# Patient Record
Sex: Female | Born: 1935 | Race: Black or African American | Hispanic: No | Smoking: Former smoker
Health system: Southern US, Community
[De-identification: ages and names within clinical notes are randomized; demographics above are authoritative.]

## PROBLEM LIST (undated history)

## (undated) ENCOUNTER — Emergency Department

## (undated) DIAGNOSIS — E119 Type 2 diabetes mellitus without complications: Secondary | ICD-10-CM

## (undated) DIAGNOSIS — I639 Cerebral infarction, unspecified: Secondary | ICD-10-CM

## (undated) DIAGNOSIS — I10 Essential (primary) hypertension: Secondary | ICD-10-CM

## (undated) DIAGNOSIS — C801 Malignant (primary) neoplasm, unspecified: Secondary | ICD-10-CM

## (undated) HISTORY — PX: BREAST SURGERY: SHX581

## (undated) HISTORY — PX: KNEE SURGERY: SHX244

---

## 2004-07-02 ENCOUNTER — Ambulatory Visit: Payer: Self-pay | Admitting: Ophthalmology

## 2004-07-07 ENCOUNTER — Ambulatory Visit: Payer: Self-pay | Admitting: Ophthalmology

## 2004-09-11 ENCOUNTER — Emergency Department: Payer: Self-pay | Admitting: Internal Medicine

## 2004-09-11 ENCOUNTER — Other Ambulatory Visit: Payer: Self-pay

## 2004-09-13 ENCOUNTER — Ambulatory Visit: Payer: Self-pay | Admitting: Unknown Physician Specialty

## 2005-12-21 ENCOUNTER — Emergency Department: Payer: Self-pay | Admitting: Unknown Physician Specialty

## 2005-12-27 ENCOUNTER — Ambulatory Visit: Payer: Self-pay | Admitting: Unknown Physician Specialty

## 2006-03-26 ENCOUNTER — Emergency Department: Payer: Self-pay | Admitting: Emergency Medicine

## 2006-06-22 ENCOUNTER — Emergency Department: Payer: Self-pay | Admitting: Emergency Medicine

## 2006-08-16 ENCOUNTER — Emergency Department: Payer: Self-pay | Admitting: Emergency Medicine

## 2006-08-16 ENCOUNTER — Other Ambulatory Visit: Payer: Self-pay

## 2006-09-25 ENCOUNTER — Ambulatory Visit: Payer: Self-pay | Admitting: Unknown Physician Specialty

## 2007-01-01 ENCOUNTER — Emergency Department: Payer: Self-pay | Admitting: Emergency Medicine

## 2007-01-06 ENCOUNTER — Ambulatory Visit: Payer: Self-pay | Admitting: Unknown Physician Specialty

## 2007-01-17 ENCOUNTER — Ambulatory Visit: Payer: Self-pay | Admitting: Unknown Physician Specialty

## 2007-05-20 ENCOUNTER — Other Ambulatory Visit: Payer: Self-pay

## 2007-05-20 ENCOUNTER — Emergency Department: Payer: Self-pay | Admitting: Unknown Physician Specialty

## 2007-08-03 ENCOUNTER — Emergency Department: Payer: Self-pay | Admitting: Emergency Medicine

## 2007-08-03 ENCOUNTER — Other Ambulatory Visit: Payer: Self-pay

## 2007-11-01 ENCOUNTER — Emergency Department: Payer: Self-pay | Admitting: Emergency Medicine

## 2007-12-18 ENCOUNTER — Ambulatory Visit: Payer: Self-pay | Admitting: Unknown Physician Specialty

## 2008-07-15 ENCOUNTER — Emergency Department: Payer: Self-pay | Admitting: Emergency Medicine

## 2009-02-18 ENCOUNTER — Ambulatory Visit: Payer: Self-pay | Admitting: Unknown Physician Specialty

## 2009-03-20 ENCOUNTER — Emergency Department: Payer: Self-pay | Admitting: Emergency Medicine

## 2009-04-07 ENCOUNTER — Emergency Department: Payer: Self-pay | Admitting: Emergency Medicine

## 2009-06-11 ENCOUNTER — Ambulatory Visit: Payer: Self-pay | Admitting: Unknown Physician Specialty

## 2009-07-03 ENCOUNTER — Ambulatory Visit: Payer: Self-pay

## 2009-08-11 ENCOUNTER — Ambulatory Visit: Payer: Self-pay | Admitting: General Practice

## 2009-08-12 ENCOUNTER — Ambulatory Visit: Payer: Self-pay | Admitting: General Practice

## 2009-08-19 ENCOUNTER — Ambulatory Visit: Payer: Self-pay | Admitting: General Practice

## 2009-08-21 ENCOUNTER — Emergency Department: Payer: Self-pay | Admitting: Emergency Medicine

## 2009-09-19 ENCOUNTER — Emergency Department: Payer: Self-pay | Admitting: Internal Medicine

## 2009-09-21 ENCOUNTER — Emergency Department: Payer: Self-pay | Admitting: Emergency Medicine

## 2009-10-06 ENCOUNTER — Emergency Department: Payer: Self-pay | Admitting: Emergency Medicine

## 2009-10-07 ENCOUNTER — Emergency Department: Payer: Self-pay | Admitting: Emergency Medicine

## 2009-10-09 ENCOUNTER — Emergency Department: Payer: Self-pay | Admitting: Internal Medicine

## 2009-10-18 ENCOUNTER — Emergency Department: Payer: Self-pay | Admitting: Unknown Physician Specialty

## 2009-11-13 ENCOUNTER — Emergency Department: Payer: Self-pay | Admitting: Emergency Medicine

## 2009-11-17 ENCOUNTER — Emergency Department: Payer: Self-pay | Admitting: Emergency Medicine

## 2009-11-25 ENCOUNTER — Emergency Department: Payer: Self-pay | Admitting: Emergency Medicine

## 2009-12-09 ENCOUNTER — Ambulatory Visit: Payer: Self-pay | Admitting: Unknown Physician Specialty

## 2010-05-05 ENCOUNTER — Emergency Department: Payer: Self-pay | Admitting: Emergency Medicine

## 2010-05-20 ENCOUNTER — Ambulatory Visit: Payer: Self-pay | Admitting: Unknown Physician Specialty

## 2010-09-20 ENCOUNTER — Emergency Department: Payer: Self-pay | Admitting: Emergency Medicine

## 2010-11-10 ENCOUNTER — Emergency Department: Payer: Self-pay | Admitting: Unknown Physician Specialty

## 2010-12-01 ENCOUNTER — Emergency Department: Payer: Self-pay

## 2011-09-01 ENCOUNTER — Emergency Department: Payer: Self-pay | Admitting: Emergency Medicine

## 2011-09-01 LAB — URINALYSIS, COMPLETE
Bacteria: NONE SEEN
Bilirubin,UR: NEGATIVE
Blood: NEGATIVE
Ketone: NEGATIVE
Ph: 5 (ref 4.5–8.0)
Protein: NEGATIVE
Specific Gravity: 1.009 (ref 1.003–1.030)
Squamous Epithelial: <1
WBC UR: 2 /HPF (ref 0–5)

## 2011-09-01 LAB — CBC
MCV: 92 fL (ref 80–100)
RBC: 4.13 10*6/uL (ref 3.80–5.20)
RDW: 14.3 % (ref 11.5–14.5)

## 2011-09-01 LAB — COMPREHENSIVE METABOLIC PANEL
Albumin: 4 g/dL (ref 3.4–5.0)
Anion Gap: 10 (ref 7–16)
Bilirubin,Total: 0.6 mg/dL (ref 0.2–1.0)
Calcium, Total: 9.2 mg/dL (ref 8.5–10.1)
Chloride: 98 mmol/L (ref 98–107)
Co2: 26 mmol/L (ref 21–32)
EGFR (Non-African Amer.): 51 — ABNORMAL LOW
Osmolality: 273 (ref 275–301)
SGPT (ALT): 20 U/L
Sodium: 134 mmol/L — ABNORMAL LOW (ref 136–145)
Total Protein: 7.9 g/dL (ref 6.4–8.2)

## 2011-09-01 LAB — CK TOTAL AND CKMB (NOT AT ARMC)
CK, Total: 139 U/L (ref 21–215)
CK-MB: 0.8 ng/mL (ref 0.5–3.6)

## 2011-09-01 LAB — TROPONIN I: Troponin-I: 0.02 ng/mL

## 2011-11-07 ENCOUNTER — Ambulatory Visit: Payer: Self-pay | Admitting: Unknown Physician Specialty

## 2011-12-06 ENCOUNTER — Ambulatory Visit: Payer: Self-pay | Admitting: Surgery

## 2011-12-13 ENCOUNTER — Ambulatory Visit: Payer: Self-pay | Admitting: Surgery

## 2011-12-27 ENCOUNTER — Ambulatory Visit: Payer: Self-pay | Admitting: Hematology and Oncology

## 2011-12-27 LAB — COMPREHENSIVE METABOLIC PANEL
Albumin: 3.9 g/dL (ref 3.4–5.0)
Anion Gap: 5 — ABNORMAL LOW (ref 7–16)
BUN: 10 mg/dL (ref 7–18)
Bilirubin,Total: 0.3 mg/dL (ref 0.2–1.0)
Chloride: 103 mmol/L (ref 98–107)
Creatinine: 0.85 mg/dL (ref 0.60–1.30)
EGFR (African American): >60
Glucose: 79 mg/dL (ref 65–99)
Osmolality: 272 (ref 275–301)
Potassium: 3.9 mmol/L (ref 3.5–5.1)
Sodium: 137 mmol/L (ref 136–145)
Total Protein: 7.9 g/dL (ref 6.4–8.2)

## 2011-12-27 LAB — CBC CANCER CENTER
Basophil #: 0.1 x10 3/mm (ref 0.0–0.1)
Eosinophil #: 0 x10 3/mm (ref 0.0–0.7)
Eosinophil %: 0.6 %
HCT: 37.3 % (ref 35.0–47.0)
Lymphocyte %: 29.4 %
Monocyte %: 5.4 %
Platelet: 302 x10 3/mm (ref 150–440)
RBC: 4.12 10*6/uL (ref 3.80–5.20)
RDW: 14.3 % (ref 11.5–14.5)

## 2012-01-10 ENCOUNTER — Ambulatory Visit: Payer: Self-pay | Admitting: Hematology and Oncology

## 2012-02-08 LAB — CBC CANCER CENTER
Basophil #: 0.1 x10 3/mm (ref 0.0–0.1)
Eosinophil %: 1.1 %
HCT: 37.6 % (ref 35.0–47.0)
Lymphocyte %: 24.7 %
MCHC: 32.5 g/dL (ref 32.0–36.0)
MCV: 91 fL (ref 80–100)
Monocyte #: 0.4 x10 3/mm (ref 0.2–0.9)
Monocyte %: 6.7 %
Neutrophil #: 3.9 x10 3/mm (ref 1.4–6.5)
Platelet: 267 x10 3/mm (ref 150–440)
RDW: 14.6 % — ABNORMAL HIGH (ref 11.5–14.5)
WBC: 5.9 x10 3/mm (ref 3.6–11.0)

## 2012-02-10 ENCOUNTER — Ambulatory Visit: Payer: Self-pay | Admitting: Hematology and Oncology

## 2012-02-15 LAB — CBC CANCER CENTER
Basophil #: 0.1 x10 3/mm (ref 0.0–0.1)
Eosinophil #: 0.1 x10 3/mm (ref 0.0–0.7)
Lymphocyte #: 1.6 x10 3/mm (ref 1.0–3.6)
Lymphocyte %: 26.9 %
MCHC: 32.4 g/dL (ref 32.0–36.0)
MCV: 92 fL (ref 80–100)
Monocyte #: 0.6 x10 3/mm (ref 0.2–0.9)
Monocyte %: 10.3 %
Platelet: 262 x10 3/mm (ref 150–440)
RDW: 14.5 % (ref 11.5–14.5)
WBC: 5.8 x10 3/mm (ref 3.6–11.0)

## 2012-02-22 LAB — CBC CANCER CENTER
Basophil %: 1.4 %
Eosinophil #: 0.1 x10 3/mm (ref 0.0–0.7)
HCT: 38 % (ref 35.0–47.0)
HGB: 12.3 g/dL (ref 12.0–16.0)
Lymphocyte %: 21.8 %
MCH: 30.1 pg (ref 26.0–34.0)
MCHC: 32.4 g/dL (ref 32.0–36.0)
Monocyte #: 0.4 x10 3/mm (ref 0.2–0.9)
Neutrophil #: 3.5 x10 3/mm (ref 1.4–6.5)
WBC: 5.1 x10 3/mm (ref 3.6–11.0)

## 2012-02-29 LAB — CBC CANCER CENTER
Basophil #: 0 x10 3/mm (ref 0.0–0.1)
Basophil %: 0.8 %
Eosinophil %: 1.1 %
HCT: 36.1 % (ref 35.0–47.0)
HGB: 11.8 g/dL — ABNORMAL LOW (ref 12.0–16.0)
Lymphocyte %: 18 %
MCV: 92 fL (ref 80–100)
Monocyte #: 0.5 x10 3/mm (ref 0.2–0.9)
Monocyte %: 8.5 %
Platelet: 205 x10 3/mm (ref 150–440)
RBC: 3.93 10*6/uL (ref 3.80–5.20)
RDW: 14.9 % — ABNORMAL HIGH (ref 11.5–14.5)
WBC: 5.5 x10 3/mm (ref 3.6–11.0)

## 2012-03-11 ENCOUNTER — Ambulatory Visit: Payer: Self-pay | Admitting: Hematology and Oncology

## 2012-04-11 ENCOUNTER — Ambulatory Visit: Payer: Self-pay | Admitting: Hematology and Oncology

## 2012-05-12 ENCOUNTER — Ambulatory Visit: Payer: Self-pay | Admitting: Hematology and Oncology

## 2012-06-09 ENCOUNTER — Ambulatory Visit: Payer: Self-pay | Admitting: Hematology and Oncology

## 2012-08-14 ENCOUNTER — Ambulatory Visit: Payer: Self-pay | Admitting: Radiation Oncology

## 2012-08-31 LAB — COMPREHENSIVE METABOLIC PANEL
Albumin: 3.9 g/dL (ref 3.4–5.0)
Alkaline Phosphatase: 65 U/L (ref 50–136)
Anion Gap: 10 (ref 7–16)
Bilirubin,Total: 0.3 mg/dL (ref 0.2–1.0)
Calcium, Total: 9.4 mg/dL (ref 8.5–10.1)
Creatinine: 0.98 mg/dL (ref 0.60–1.30)
EGFR (Non-African Amer.): 56 — ABNORMAL LOW
Glucose: 84 mg/dL (ref 65–99)
Osmolality: 278 (ref 275–301)
Potassium: 3.8 mmol/L (ref 3.5–5.1)
SGOT(AST): 20 U/L (ref 15–37)
Total Protein: 7.5 g/dL (ref 6.4–8.2)

## 2012-08-31 LAB — CBC CANCER CENTER
Basophil %: 1 %
Eosinophil %: 0.5 %
MCH: 31.2 pg (ref 26.0–34.0)
Monocyte #: 0.5 x10 3/mm (ref 0.2–0.9)
RBC: 4.16 10*6/uL (ref 3.80–5.20)
RDW: 14 % (ref 11.5–14.5)
WBC: 7.8 x10 3/mm (ref 3.6–11.0)

## 2012-09-09 ENCOUNTER — Ambulatory Visit: Payer: Self-pay | Admitting: Radiation Oncology

## 2012-11-07 ENCOUNTER — Ambulatory Visit: Payer: Self-pay | Admitting: Surgery

## 2012-11-29 ENCOUNTER — Ambulatory Visit: Payer: Self-pay | Admitting: Hematology and Oncology

## 2012-11-30 LAB — CBC CANCER CENTER
Basophil #: 0.1 x10 3/mm (ref 0.0–0.1)
Basophil %: 1.3 %
Eosinophil %: 1 %
HCT: 38.1 % (ref 35.0–47.0)
HGB: 13.2 g/dL (ref 12.0–16.0)
Lymphocyte #: 1.6 x10 3/mm (ref 1.0–3.6)
Lymphocyte %: 25 %
MCHC: 34.7 g/dL (ref 32.0–36.0)
Monocyte #: 0.4 x10 3/mm (ref 0.2–0.9)
Monocyte %: 5.8 %
Neutrophil #: 4.2 x10 3/mm (ref 1.4–6.5)
Neutrophil %: 66.9 %
WBC: 6.3 x10 3/mm (ref 3.6–11.0)

## 2012-11-30 LAB — COMPREHENSIVE METABOLIC PANEL
Alkaline Phosphatase: 72 U/L (ref 50–136)
Anion Gap: 12 (ref 7–16)
BUN: 10 mg/dL (ref 7–18)
Bilirubin,Total: 0.3 mg/dL (ref 0.2–1.0)
EGFR (African American): 58 — ABNORMAL LOW
EGFR (Non-African Amer.): 50 — ABNORMAL LOW
Glucose: 109 mg/dL — ABNORMAL HIGH (ref 65–99)
Osmolality: 283 (ref 275–301)
SGOT(AST): 22 U/L (ref 15–37)

## 2012-12-10 ENCOUNTER — Ambulatory Visit: Payer: Self-pay | Admitting: Hematology and Oncology

## 2013-03-01 ENCOUNTER — Ambulatory Visit: Payer: Self-pay | Admitting: Hematology and Oncology

## 2013-03-01 LAB — CBC CANCER CENTER
Basophil #: 0.1 "x10 3/mm "
Basophil %: 1.1 %
Eosinophil #: 0.1 "x10 3/mm "
Eosinophil %: 0.8 %
HCT: 41.4 %
HGB: 13.2 g/dL
Lymphocyte %: 20.1 %
Lymphs Abs: 1.5 "x10 3/mm "
MCH: 29.8 pg
MCHC: 32 g/dL
MCV: 93 fL
Monocyte #: 0.4 "x10 3/mm "
Monocyte %: 5 %
Neutrophil #: 5.4 "x10 3/mm "
Neutrophil %: 73 %
Platelet: 234 "x10 3/mm "
RBC: 4.45 "x10 6/mm "
RDW: 13.7 %
WBC: 7.4 "x10 3/mm "

## 2013-03-01 LAB — COMPREHENSIVE METABOLIC PANEL WITH GFR
Albumin: 4.1 g/dL
Alkaline Phosphatase: 68 U/L
Anion Gap: 8
BUN: 15 mg/dL
Bilirubin,Total: 0.3 mg/dL
Calcium, Total: 9.6 mg/dL
Chloride: 101 mmol/L
Co2: 29 mmol/L
Creatinine: 1.39 mg/dL — ABNORMAL HIGH
EGFR (African American): 43 — ABNORMAL LOW
EGFR (Non-African Amer.): 37 — ABNORMAL LOW
Glucose: 228 mg/dL — ABNORMAL HIGH
Osmolality: 284
Potassium: 4.4 mmol/L
SGOT(AST): 19 U/L
SGPT (ALT): 24 U/L
Sodium: 138 mmol/L
Total Protein: 7.6 g/dL

## 2013-03-11 ENCOUNTER — Ambulatory Visit: Payer: Self-pay | Admitting: Hematology and Oncology

## 2013-06-13 DIAGNOSIS — Z853 Personal history of malignant neoplasm of breast: Secondary | ICD-10-CM

## 2013-06-13 DIAGNOSIS — Z8719 Personal history of other diseases of the digestive system: Secondary | ICD-10-CM

## 2013-10-02 ENCOUNTER — Observation Stay: Payer: Self-pay | Admitting: Internal Medicine

## 2013-10-02 LAB — TROPONIN I
TROPONIN-I: 0.06 ng/mL — AB
Troponin-I: 0.07 ng/mL — ABNORMAL HIGH
Troponin-I: 0.06 ng/mL — ABNORMAL HIGH

## 2013-10-02 LAB — CBC
HCT: 38.4 % (ref 35.0–47.0)
HGB: 13 g/dL (ref 12.0–16.0)
MCH: 31 pg (ref 26.0–34.0)
MCHC: 33.9 g/dL (ref 32.0–36.0)
MCV: 91 fL (ref 80–100)
PLATELETS: 251 10*3/uL (ref 150–440)
RBC: 4.2 10*6/uL (ref 3.80–5.20)
RDW: 13.7 % (ref 11.5–14.5)
WBC: 7.4 10*3/uL (ref 3.6–11.0)

## 2013-10-02 LAB — URINALYSIS, COMPLETE
BLOOD: NEGATIVE
Bilirubin,UR: NEGATIVE
GLUCOSE, UR: NEGATIVE mg/dL (ref 0–75)
NITRITE: NEGATIVE
Ph: 5 (ref 4.5–8.0)
Protein: NEGATIVE
Specific Gravity: 1.015 (ref 1.003–1.030)

## 2013-10-02 LAB — COMPREHENSIVE METABOLIC PANEL
ALK PHOS: 49 U/L
ANION GAP: 9 (ref 7–16)
Albumin: 3.8 g/dL (ref 3.4–5.0)
BUN: 9 mg/dL (ref 7–18)
Bilirubin,Total: 0.5 mg/dL (ref 0.2–1.0)
Calcium, Total: 9.3 mg/dL (ref 8.5–10.1)
Chloride: 102 mmol/L (ref 98–107)
Co2: 26 mmol/L (ref 21–32)
Creatinine: 1.22 mg/dL (ref 0.60–1.30)
EGFR (African American): 50 — ABNORMAL LOW
GFR CALC NON AF AMER: 43 — AB
Glucose: 189 mg/dL — ABNORMAL HIGH (ref 65–99)
Osmolality: 278 (ref 275–301)
Potassium: 3.8 mmol/L (ref 3.5–5.1)
SGOT(AST): 22 U/L (ref 15–37)
SGPT (ALT): 16 U/L (ref 12–78)
Sodium: 137 mmol/L (ref 136–145)
Total Protein: 7.3 g/dL (ref 6.4–8.2)

## 2013-10-02 LAB — LIPASE, BLOOD: LIPASE: 83 U/L (ref 73–393)

## 2013-10-02 LAB — CK TOTAL AND CKMB (NOT AT ARMC)
CK, Total: 76 U/L
CK-MB: 0.5 ng/mL (ref 0.5–3.6)

## 2013-10-02 LAB — HEMOGLOBIN A1C: HEMOGLOBIN A1C: 8 % — AB (ref 4.2–6.3)

## 2013-10-03 LAB — CBC WITH DIFFERENTIAL/PLATELET
Basophil #: 0 10*3/uL (ref 0.0–0.1)
Basophil %: 0.6 %
EOS ABS: 0.1 10*3/uL (ref 0.0–0.7)
Eosinophil %: 1.2 %
HCT: 37 % (ref 35.0–47.0)
HGB: 12.6 g/dL (ref 12.0–16.0)
LYMPHS PCT: 28.5 %
Lymphocyte #: 1.7 10*3/uL (ref 1.0–3.6)
MCH: 31 pg (ref 26.0–34.0)
MCHC: 33.9 g/dL (ref 32.0–36.0)
MCV: 91 fL (ref 80–100)
MONO ABS: 0.4 x10 3/mm (ref 0.2–0.9)
Monocyte %: 7.3 %
NEUTROS ABS: 3.7 10*3/uL (ref 1.4–6.5)
NEUTROS PCT: 62.4 %
PLATELETS: 230 10*3/uL (ref 150–440)
RBC: 4.06 10*6/uL (ref 3.80–5.20)
RDW: 13.7 % (ref 11.5–14.5)
WBC: 5.9 10*3/uL (ref 3.6–11.0)

## 2013-10-03 LAB — LIPID PANEL
Cholesterol: 120 mg/dL (ref 0–200)
HDL Cholesterol: 43 mg/dL (ref 40–60)
Ldl Cholesterol, Calc: 45 mg/dL (ref 0–100)
Triglycerides: 159 mg/dL (ref 0–200)
VLDL Cholesterol, Calc: 32 mg/dL (ref 5–40)

## 2013-10-03 LAB — BASIC METABOLIC PANEL
Anion Gap: 8 (ref 7–16)
BUN: 9 mg/dL (ref 7–18)
CALCIUM: 8.7 mg/dL (ref 8.5–10.1)
CHLORIDE: 104 mmol/L (ref 98–107)
CO2: 28 mmol/L (ref 21–32)
CREATININE: 1.02 mg/dL (ref 0.60–1.30)
EGFR (African American): >60
GFR CALC NON AF AMER: 53 — AB
Glucose: 117 mg/dL — ABNORMAL HIGH (ref 65–99)
Osmolality: 279 (ref 275–301)
Potassium: 3.3 mmol/L — ABNORMAL LOW (ref 3.5–5.1)
Sodium: 140 mmol/L (ref 136–145)

## 2013-10-04 LAB — URINE CULTURE

## 2013-11-11 ENCOUNTER — Ambulatory Visit: Payer: Self-pay | Admitting: Hematology and Oncology

## 2014-03-02 LAB — CBC WITH DIFFERENTIAL/PLATELET
Basophil #: 0.1 10*3/uL (ref 0.0–0.1)
Basophil %: 1 %
EOS ABS: 0.1 10*3/uL (ref 0.0–0.7)
Eosinophil %: 1 %
HCT: 41.6 % (ref 35.0–47.0)
HGB: 13.5 g/dL (ref 12.0–16.0)
LYMPHS PCT: 23.2 %
Lymphocyte #: 1.6 10*3/uL (ref 1.0–3.6)
MCH: 30.9 pg (ref 26.0–34.0)
MCHC: 32.3 g/dL (ref 32.0–36.0)
MCV: 96 fL (ref 80–100)
Monocyte #: 0.4 x10 3/mm (ref 0.2–0.9)
Monocyte %: 6.1 %
NEUTROS ABS: 4.8 10*3/uL (ref 1.4–6.5)
Neutrophil %: 68.7 %
PLATELETS: 222 10*3/uL (ref 150–440)
RBC: 4.35 10*6/uL (ref 3.80–5.20)
RDW: 13.5 % (ref 11.5–14.5)
WBC: 7 10*3/uL (ref 3.6–11.0)

## 2014-03-02 LAB — URINALYSIS, COMPLETE
BLOOD: NEGATIVE
Bilirubin,UR: NEGATIVE
Glucose,UR: NEGATIVE mg/dL (ref 0–75)
Nitrite: NEGATIVE
Ph: 5 (ref 4.5–8.0)
Protein: 30
Specific Gravity: 1.021 (ref 1.003–1.030)
WBC UR: 163 /HPF (ref 0–5)

## 2014-03-02 LAB — BASIC METABOLIC PANEL
ANION GAP: 10 (ref 7–16)
BUN: 10 mg/dL (ref 7–18)
CHLORIDE: 103 mmol/L (ref 98–107)
CO2: 27 mmol/L (ref 21–32)
Calcium, Total: 9 mg/dL (ref 8.5–10.1)
Creatinine: 1.12 mg/dL (ref 0.60–1.30)
EGFR (African American): >60
EGFR (Non-African Amer.): 50 — ABNORMAL LOW
Glucose: 158 mg/dL — ABNORMAL HIGH (ref 65–99)
Osmolality: 282 (ref 275–301)
POTASSIUM: 4 mmol/L (ref 3.5–5.1)
Sodium: 140 mmol/L (ref 136–145)

## 2014-03-02 LAB — TROPONIN I: TROPONIN-I: 0.08 ng/mL — AB

## 2014-03-03 ENCOUNTER — Other Ambulatory Visit: Payer: Self-pay | Admitting: Physician Assistant

## 2014-03-03 ENCOUNTER — Inpatient Hospital Stay: Payer: Self-pay | Admitting: Internal Medicine

## 2014-03-03 LAB — CK-MB
CK-MB: 0.7 ng/mL (ref 0.5–3.6)
CK-MB: 0.6 ng/mL (ref 0.5–3.6)
CK-MB: 0.7 ng/mL (ref 0.5–3.6)

## 2014-03-03 LAB — TROPONIN I
Troponin-I: 0.08 ng/mL — ABNORMAL HIGH
Troponin-I: 0.08 ng/mL — ABNORMAL HIGH

## 2014-03-04 LAB — BASIC METABOLIC PANEL
Anion Gap: 10 (ref 7–16)
BUN: 10 mg/dL (ref 7–18)
Calcium, Total: 9.3 mg/dL (ref 8.5–10.1)
Chloride: 103 mmol/L (ref 98–107)
Co2: 29 mmol/L (ref 21–32)
Creatinine: 0.94 mg/dL (ref 0.60–1.30)
EGFR (African American): >60
EGFR (Non-African Amer.): >60
Glucose: 90 mg/dL (ref 65–99)
Osmolality: 282 (ref 275–301)
Potassium: 3.4 mmol/L — ABNORMAL LOW (ref 3.5–5.1)
Sodium: 142 mmol/L (ref 136–145)

## 2014-03-04 LAB — LIPID PANEL
Cholesterol: 124 mg/dL (ref 0–200)
HDL Cholesterol: 43 mg/dL (ref 40–60)
Ldl Cholesterol, Calc: 57 mg/dL (ref 0–100)
Triglycerides: 120 mg/dL (ref 0–200)
VLDL Cholesterol, Calc: 24 mg/dL (ref 5–40)

## 2014-03-04 LAB — HEMOGLOBIN A1C: Hemoglobin A1C: 7.3 % — ABNORMAL HIGH (ref 4.2–6.3)

## 2014-03-04 LAB — TSH: Thyroid Stimulating Horm: 0.641 u[IU]/mL

## 2014-03-05 LAB — URINALYSIS, COMPLETE
BLOOD: NEGATIVE
Bacteria: NONE SEEN
Bilirubin,UR: NEGATIVE
GLUCOSE, UR: NEGATIVE mg/dL (ref 0–75)
Hyaline Cast: 11
KETONE: NEGATIVE
Nitrite: NEGATIVE
PROTEIN: NEGATIVE
Ph: 5 (ref 4.5–8.0)
RBC,UR: 2 /HPF (ref 0–5)
SPECIFIC GRAVITY: 1.026 (ref 1.003–1.030)
Squamous Epithelial: 1
WBC UR: 7 /HPF (ref 0–5)

## 2014-03-05 LAB — COMPREHENSIVE METABOLIC PANEL
ALBUMIN: 3.9 g/dL (ref 3.4–5.0)
AST: 23 U/L (ref 15–37)
Alkaline Phosphatase: 60 U/L
Anion Gap: 11 (ref 7–16)
BUN: 9 mg/dL (ref 7–18)
Bilirubin,Total: 0.6 mg/dL (ref 0.2–1.0)
CREATININE: 0.99 mg/dL (ref 0.60–1.30)
Calcium, Total: 8.8 mg/dL (ref 8.5–10.1)
Chloride: 103 mmol/L (ref 98–107)
Co2: 26 mmol/L (ref 21–32)
EGFR (African American): >60
EGFR (Non-African Amer.): 58 — ABNORMAL LOW
Glucose: 202 mg/dL — ABNORMAL HIGH (ref 65–99)
Osmolality: 284 (ref 275–301)
Potassium: 3.3 mmol/L — ABNORMAL LOW (ref 3.5–5.1)
SGPT (ALT): 19 U/L
SODIUM: 140 mmol/L (ref 136–145)
TOTAL PROTEIN: 7.7 g/dL (ref 6.4–8.2)

## 2014-03-05 LAB — PROTIME-INR
INR: 1.1
Prothrombin Time: 14.1 secs (ref 11.5–14.7)

## 2014-03-05 LAB — CBC WITH DIFFERENTIAL/PLATELET
BASOS ABS: 0 10*3/uL (ref 0.0–0.1)
Basophil %: 0.5 %
EOS ABS: 0.1 10*3/uL (ref 0.0–0.7)
EOS PCT: 1.1 %
HCT: 41.6 % (ref 35.0–47.0)
HGB: 13.7 g/dL (ref 12.0–16.0)
LYMPHS ABS: 1.3 10*3/uL (ref 1.0–3.6)
Lymphocyte %: 24.5 %
MCH: 30.8 pg (ref 26.0–34.0)
MCHC: 32.8 g/dL (ref 32.0–36.0)
MCV: 94 fL (ref 80–100)
MONO ABS: 0.4 x10 3/mm (ref 0.2–0.9)
Monocyte %: 8.2 %
Neutrophil #: 3.4 10*3/uL (ref 1.4–6.5)
Neutrophil %: 65.7 %
Platelet: 255 10*3/uL (ref 150–440)
RBC: 4.44 10*6/uL (ref 3.80–5.20)
RDW: 13.7 % (ref 11.5–14.5)
WBC: 5.1 10*3/uL (ref 3.6–11.0)

## 2014-03-05 LAB — CK TOTAL AND CKMB (NOT AT ARMC)
CK, Total: 127 U/L (ref 26–192)
CK-MB: 0.9 ng/mL (ref 0.5–3.6)

## 2014-03-05 LAB — TROPONIN I: Troponin-I: 0.07 ng/mL — ABNORMAL HIGH

## 2014-03-06 ENCOUNTER — Inpatient Hospital Stay: Payer: Self-pay | Admitting: Internal Medicine

## 2014-03-06 LAB — CBC WITH DIFFERENTIAL/PLATELET
Basophil #: 0 10*3/uL (ref 0.0–0.1)
Basophil %: 0.8 %
Eosinophil #: 0.1 10*3/uL (ref 0.0–0.7)
Eosinophil %: 0.9 %
HCT: 39.7 % (ref 35.0–47.0)
HGB: 12.9 g/dL (ref 12.0–16.0)
Lymphocyte #: 1.5 10*3/uL (ref 1.0–3.6)
Lymphocyte %: 27.3 %
MCH: 30.5 pg (ref 26.0–34.0)
MCHC: 32.3 g/dL (ref 32.0–36.0)
MCV: 94 fL (ref 80–100)
Monocyte #: 0.4 x10 3/mm (ref 0.2–0.9)
Monocyte %: 7.9 %
Neutrophil #: 3.5 10*3/uL (ref 1.4–6.5)
Neutrophil %: 63.1 %
PLATELETS: 236 10*3/uL (ref 150–440)
RBC: 4.21 10*6/uL (ref 3.80–5.20)
RDW: 13.3 % (ref 11.5–14.5)
WBC: 5.6 10*3/uL (ref 3.6–11.0)

## 2014-03-06 LAB — BASIC METABOLIC PANEL
ANION GAP: 6 — AB (ref 7–16)
BUN: 8 mg/dL (ref 7–18)
CALCIUM: 8.6 mg/dL (ref 8.5–10.1)
CHLORIDE: 109 mmol/L — AB (ref 98–107)
Co2: 28 mmol/L (ref 21–32)
Creatinine: 0.88 mg/dL (ref 0.60–1.30)
EGFR (African American): >60
EGFR (Non-African Amer.): >60
Glucose: 142 mg/dL — ABNORMAL HIGH (ref 65–99)
Osmolality: 286 (ref 275–301)
Potassium: 4.1 mmol/L (ref 3.5–5.1)
SODIUM: 143 mmol/L (ref 136–145)

## 2014-03-06 LAB — LIPID PANEL
Cholesterol: 145 mg/dL (ref 0–200)
HDL Cholesterol: 48 mg/dL (ref 40–60)
Ldl Cholesterol, Calc: 72 mg/dL (ref 0–100)
Triglycerides: 127 mg/dL (ref 0–200)
VLDL Cholesterol, Calc: 25 mg/dL (ref 5–40)

## 2014-03-07 LAB — BASIC METABOLIC PANEL
Anion Gap: 6 — ABNORMAL LOW (ref 7–16)
BUN: 10 mg/dL (ref 7–18)
CALCIUM: 9.2 mg/dL (ref 8.5–10.1)
CO2: 30 mmol/L (ref 21–32)
Chloride: 103 mmol/L (ref 98–107)
Creatinine: 1.1 mg/dL (ref 0.60–1.30)
EGFR (African American): >60
GFR CALC NON AF AMER: 51 — AB
Glucose: 136 mg/dL — ABNORMAL HIGH (ref 65–99)
Osmolality: 279 (ref 275–301)
Potassium: 3.8 mmol/L (ref 3.5–5.1)
Sodium: 139 mmol/L (ref 136–145)

## 2014-03-12 ENCOUNTER — Emergency Department: Payer: Self-pay | Admitting: Emergency Medicine

## 2014-03-12 LAB — URINALYSIS, COMPLETE
BILIRUBIN, UR: NEGATIVE
BLOOD: NEGATIVE
Bacteria: NONE SEEN
Glucose,UR: NEGATIVE mg/dL (ref 0–75)
Hyaline Cast: 24
NITRITE: NEGATIVE
PH: 5 (ref 4.5–8.0)
Specific Gravity: 1.018 (ref 1.003–1.030)
WBC UR: 6 /HPF (ref 0–5)

## 2014-03-12 LAB — BASIC METABOLIC PANEL
ANION GAP: 14 (ref 7–16)
BUN: 12 mg/dL (ref 7–18)
CALCIUM: 9.5 mg/dL (ref 8.5–10.1)
CHLORIDE: 101 mmol/L (ref 98–107)
Co2: 22 mmol/L (ref 21–32)
Creatinine: 1.2 mg/dL (ref 0.60–1.30)
EGFR (African American): 56 — ABNORMAL LOW
EGFR (Non-African Amer.): 46 — ABNORMAL LOW
Glucose: 203 mg/dL — ABNORMAL HIGH (ref 65–99)
Osmolality: 279 (ref 275–301)
Potassium: 3.9 mmol/L (ref 3.5–5.1)
Sodium: 137 mmol/L (ref 136–145)

## 2014-03-12 LAB — CBC
HCT: 39.4 % (ref 35.0–47.0)
HGB: 13 g/dL (ref 12.0–16.0)
MCH: 30.9 pg (ref 26.0–34.0)
MCHC: 33 g/dL (ref 32.0–36.0)
MCV: 94 fL (ref 80–100)
Platelet: 257 10*3/uL (ref 150–440)
RBC: 4.22 10*6/uL (ref 3.80–5.20)
RDW: 13.5 % (ref 11.5–14.5)
WBC: 7 10*3/uL (ref 3.6–11.0)

## 2014-03-12 LAB — TROPONIN I: Troponin-I: 0.05 ng/mL

## 2014-06-02 IMAGING — CT CT OUTSIDE FILMS BODY
1 series · 16 of 32 positions shown, 20 images · non-contrast
Comparison: none

[Series 2: soft tissue (id) x (id) · axial · 0.98mm/px · z∈[-135,+185]mm · 16 of 143 slices shown, 20 images]
[im 10/143  soft-tissue]
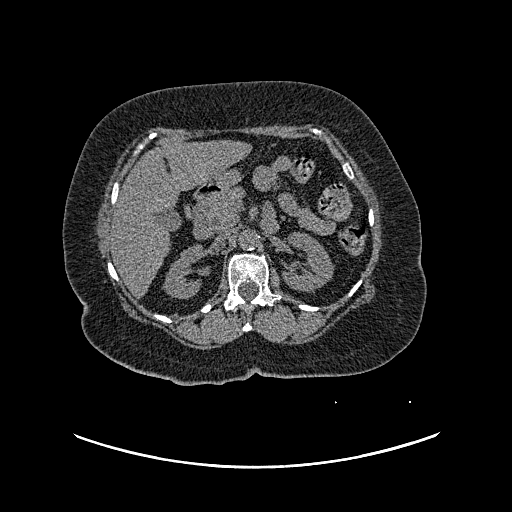
[im 10/143  bone]
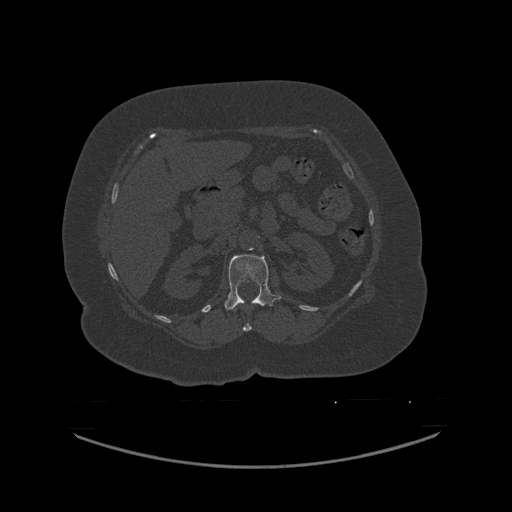
[im 19/143  soft-tissue]
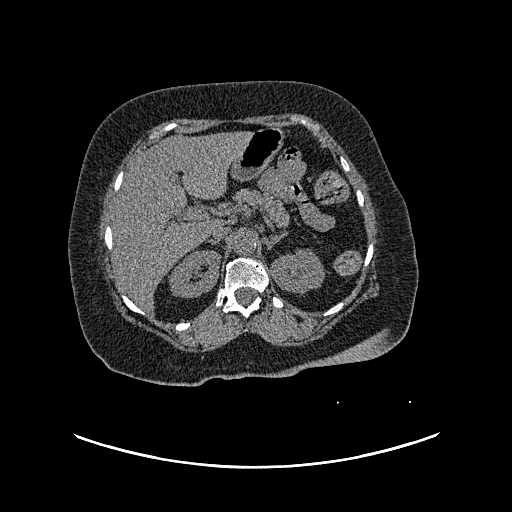
[im 28/143  soft-tissue]
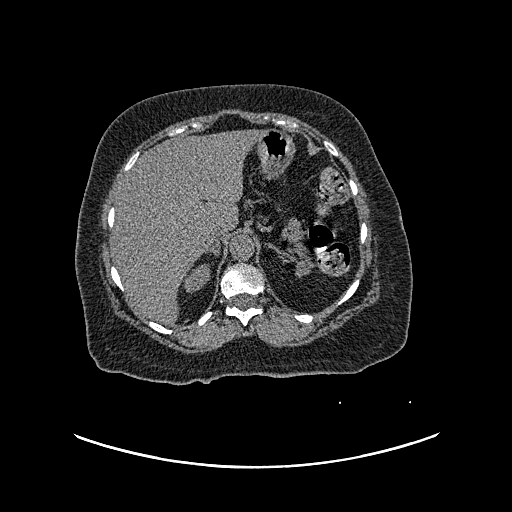
[im 37/143  soft-tissue]
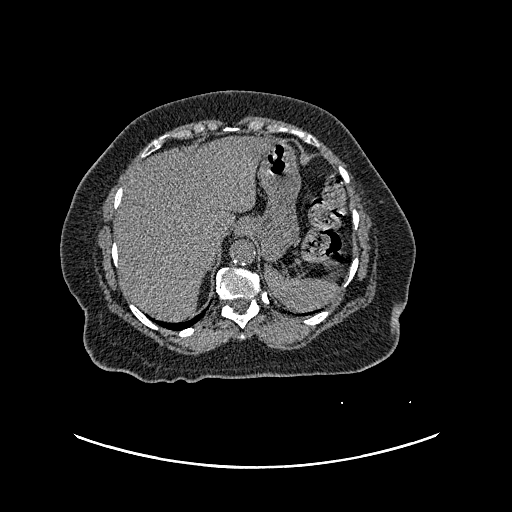
[im 46/143  soft-tissue]
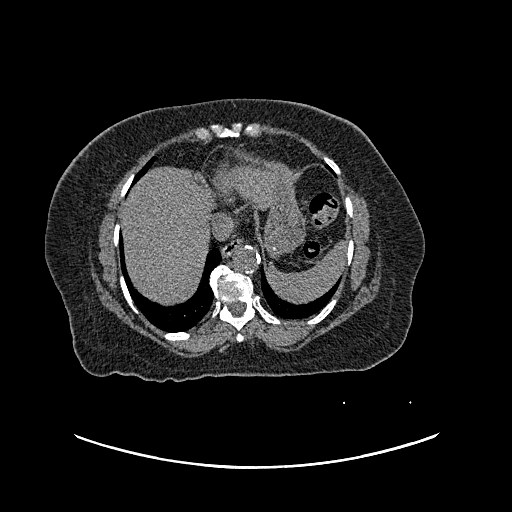
[im 55/143  soft-tissue]
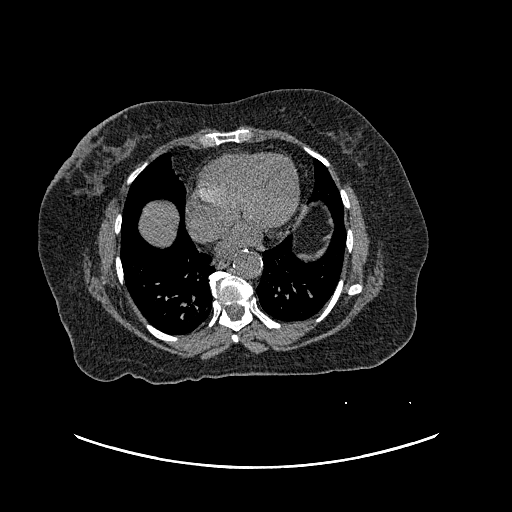
[im 65/143  soft-tissue]
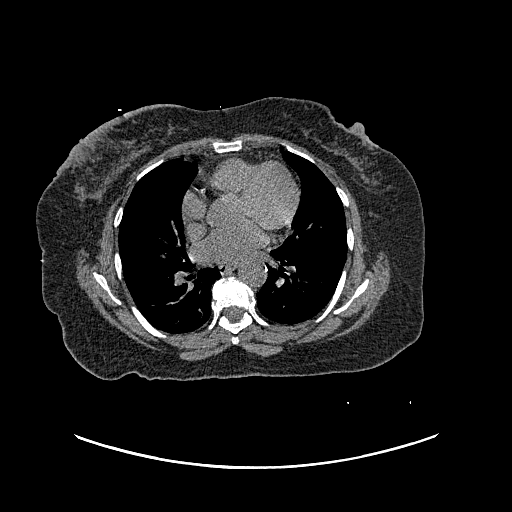
[im 78/143  soft-tissue]
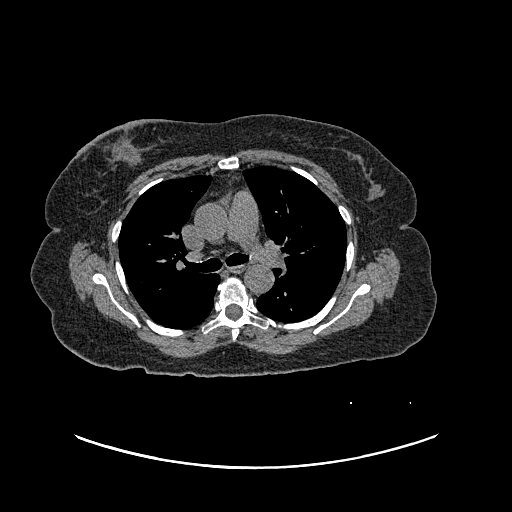
[im 88/143  soft-tissue]
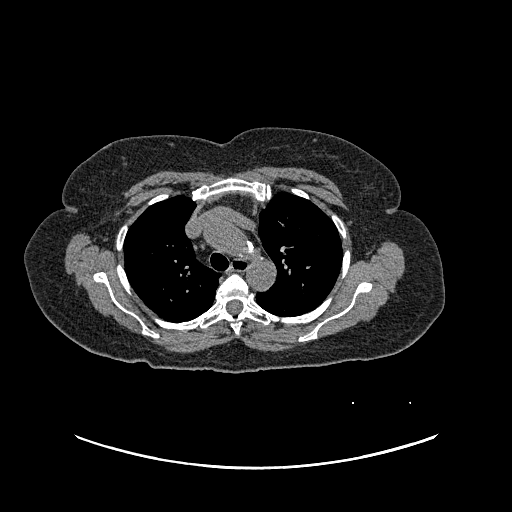
[im 88/143  bone]
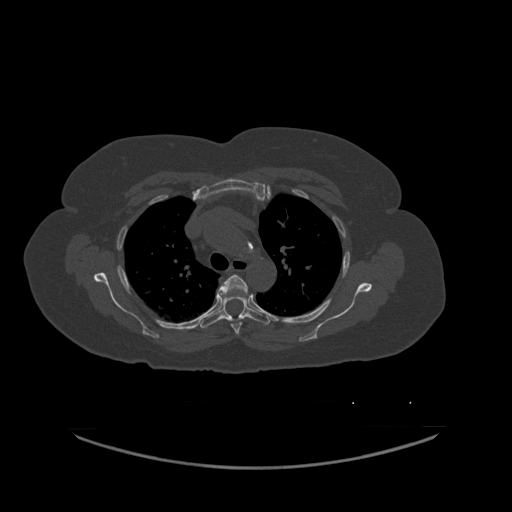
[im 97/143  soft-tissue]
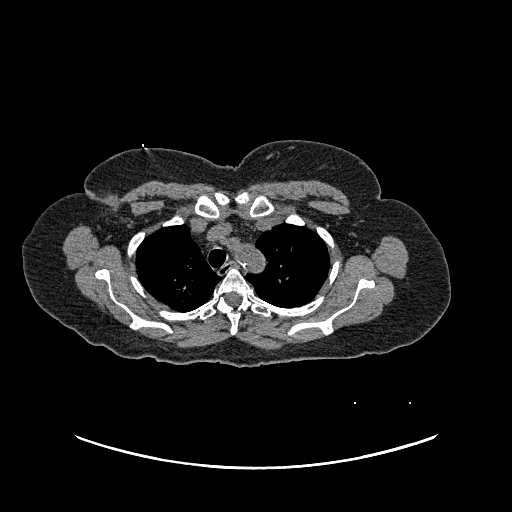
[im 106/143  soft-tissue]
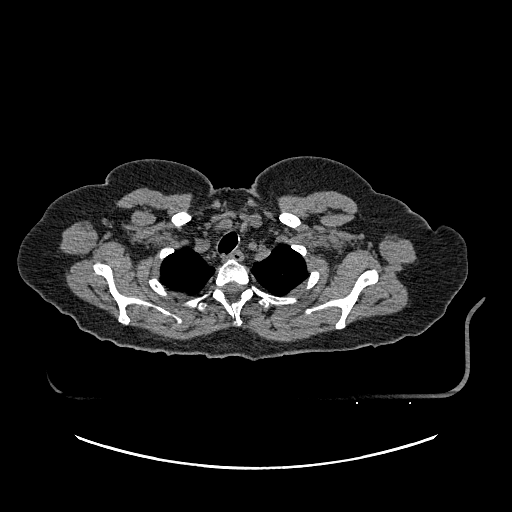
[im 115/143  soft-tissue]
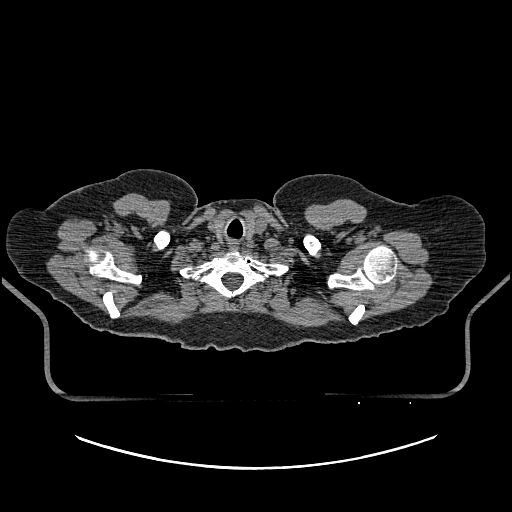
[im 124/143  soft-tissue]
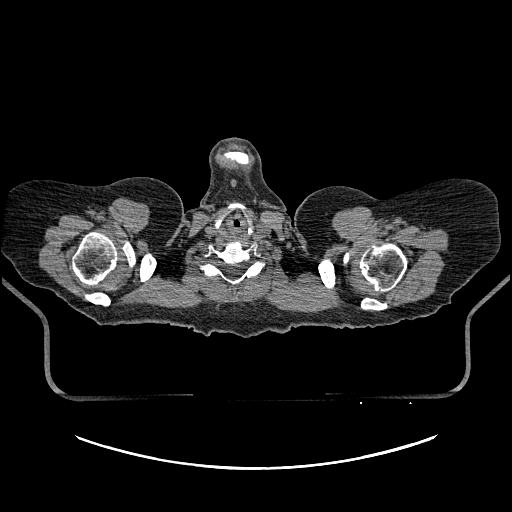
[im 124/143  lung]
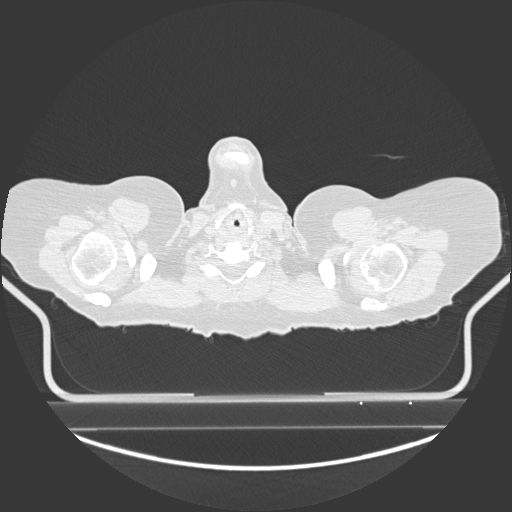
[im 129/143  lung]
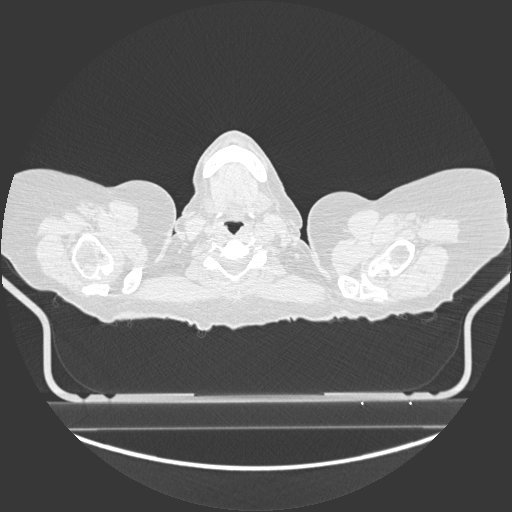
[im 133/143  soft-tissue]
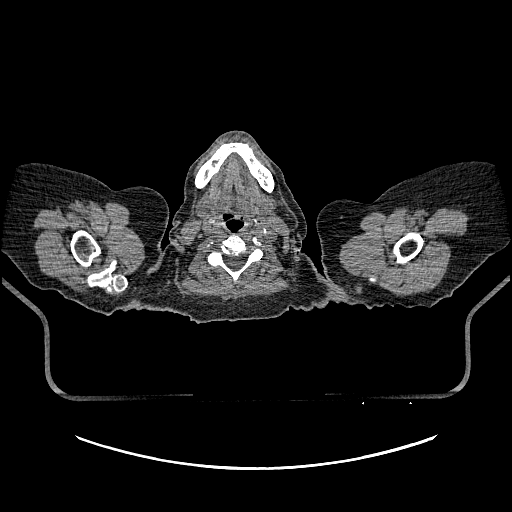
[im 133/143  lung]
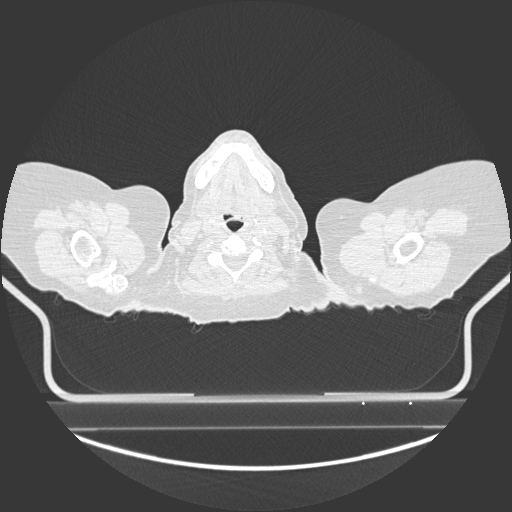
[im 138/143  lung]
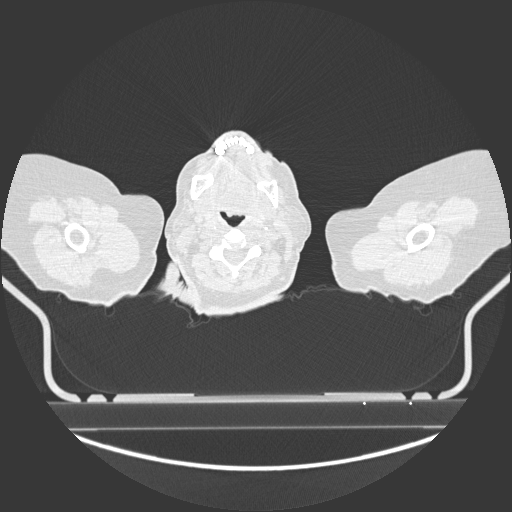

[16 of 32 positions shown; findings below may reference images not displayed]

**** An original report or order could not be provided from the [HOSPITAL] [facility] ****

## 2014-06-09 DIAGNOSIS — I10 Essential (primary) hypertension: Secondary | ICD-10-CM | POA: Diagnosis present

## 2014-07-29 NOTE — Op Note (Signed)
PATIENT NAME:  [name], [name] MR#:  [id number] DATE OF BIRTH:  01-28-1936  DATE OF PROCEDURE:  12/13/2011  PREOPERATIVE DIAGNOSIS: Carcinoma of the right breast.   POSTOPERATIVE DIAGNOSIS: Carcinoma of the right breast.   PROCEDURE: Right partial mastectomy with axillary sentinel lymph node biopsy.   SURGEON: Loreli Dollar, MD  ANESTHESIA: General.   INDICATIONS: This 79 year old female recently had findings of a mass which is palpable in the upper aspect of the right breast. She had needle biopsy findings of cancer and surgery was recommended for definitive treatment.   DESCRIPTION OF PROCEDURE: The patient was placed on the operating table in the supine position under general anesthesia. The right arm was placed on a lateral arm support. The right breast and surrounding chest wall and upper arm were prepared with ChloraPrep, draped in a sterile manner. It is noted the patient had preoperative injection of radioactive technetium sulfur colloid. I reviewed her scan which demonstrated two lymph nodes in the axilla.   The gamma counter was used to probe the axilla and identified the site of radioactivity in the inferior aspect of the right axilla. An oblique incision was made in the inferior aspect of the axilla and carried down through subcutaneous tissues. One traversing artery was divided between 4-0 chromic suture ligatures. Dissection was carried down through superficial fascia down deep within the axilla adjacent to the chest wall where radioactivity was found with the gamma counter and there was a lymph node found which was removed with some surrounding fatty tissue using electrocautery for hemostasis and the ex vivo count was in the range of 250 to 300 counts per second. This was submitted as sentinel lymph node #1. There was additional radioactivity found somewhat deeper adjacent to the rib cage. Another lymph node was dissected free from surrounding structures. The ex vivo count was in the  range of 300 to 350 and this was labeled as sentinel lymph node #2 and submitted for pathology. The background count was in the range of 0 to 6. The wound was inspected. There was no palpable mass within the axilla. Several small bleeding points were cauterized. Hemostasis was subsequently intact. The retractor was removed and allowed the wound to approximate spontaneously.   Next, the mass was palpable at approximately 12:00 position of the right breast several centimeters above the areola. A transversely oriented elliptical excision was carried out removing approximately 1.5 cm ellipse of skin. The incision went from 10:00 to 2:00. The 2:00 position of the skin ellipse was tagged with a chromic stitch for the pathologist's orientation. Dissection was carried down through subcutaneous tissues and while palpating the mass the dissection was carried around the mass removing normal appearing tissue surrounding the mass. It was somewhat deep within the right breast. Margin maps were used to suture markers to the specimen marking the cranial, caudal, medial, lateral, and deep margins and was submitted for pathology.   It is noted that during the course of the procedure a pathologist called back three times. The first two times to indicate that the sentinel lymph nodes were free of tumor and the third time to indicate that the margins around the cancer appeared to be a centimeter and were adequate.   The axillary wound was inspected and hemostasis was intact. Subcutaneous tissues were closed with 4-0 chromic. The skin was closed with 4-0 Monocryl running subcuticular suture.   The partial mastectomy wound was inspected. Numerous small bleeding points were cauterized. The subcutaneous tissues for both incisions were  infiltrated with 0.5% Sensorcaine with epinephrine and also in the partial mastectomy wound some of the deeper tissues around the cauterized bleeding points were infiltrated as well. The subcutaneous  tissues were closed with interrupted inverted 4-0 chromic and then the skin was closed with running 4-0 Monocryl subcuticular suture.   Both wounds were treated with Dermabond. The patient tolerated surgery satisfactorily and was prepared for transfer to the recovery room.  ____________________________ Lenna Sciara. Rochel Brome, MD jws:cms D: 12/13/2011 13:10:10 ET T: 12/13/2011 13:39:35 ET JOB#: [id number]  cc: Loreli Dollar, MD, <Dictator>  Loreli Dollar MD ELECTRONICALLY SIGNED 12/14/2011 12:04

## 2014-08-02 NOTE — Discharge Summary (Signed)
PATIENT NAME:  [name] [name], [name] [name] MR#:  [id number] DATE OF BIRTH:  11-May-1935  DATE OF ADMISSION:  03/06/2014 DATE OF DISCHARGE:  03/08/2014  DIAGNOSIS AT TIME FOR DISCHARGE: 1.  Presyncope with vertigo and likely secondary to left occipital cerebrovascular accident following recent cardiac catheterization.  2.  Hypertension.  3.  Hypokalemia.  4.  Urinary tract infection.  5.  Type 2 diabetes.   CHIEF COMPLAINT: Weakness, decreased appetite.   HISTORY OF PRESENT ILLNESS: [name] [name] is a 79 year old African American female with a history of hypertension and diabetes who had been admitted to the hospital on 03/04/2014 with weakness and near syncope and was noted to have elevated troponins. The patient had been seen by Dr. Clayborn Bigness and subsequently underwent a cardiac catheterization which showed only minimal coronary artery disease. The patient had been advised to stay, but declined further workup and was discharged. She returns now complaining of generalized weakness. She has also been complaining of vertigo and has to hold onto things even to stand up. The patient reports significant weight loss in the last few months.   PAST MEDICAL HISTORY: Significant for hypertension, insulin-requiring diabetes, history of breast cancer, status post lumpectomy and radiation. Please see H and P for other details.   HOSPITAL COURSE: The patient underwent imaging studies including CT of the head, which did not show any acute abnormality. There was stable mild atrophy with chronic microvascular ischemic white matter changes and mild atherosclerotic calcification in the bilateral cavernous carotid arteries; however, an MRI of the brain without contrast showed evidence of small areas of acute infarct in the left occipital and parietal lobe likely related to emboli given history of recent cardiac catheterization. The patient was ambulated and overall appeared to be stable and was discharged in stable condition on the  following medications.  MEDICATIONS ON DISCHARGE: Atorvastatin 10 mg once a day, Levemir insulin 30 units subcutaneous at bedtime, Levaquin 250 mg a day for 2 more days, nitroglycerin 0.4 mg sublingual p.r.n., metformin 1000 mg 1.5 tablets in the morning and 1 tablet in the evening, carvedilol 3.125 mg b.i.d., aspirin 325 mg once a day, meclizine 12.5 mg 4 times a day as needed, alprazolam 0.5 mg p.o. b.i.d. p.r.n. for anxiety, fluticasone nasal spray 2 sprays intranasally as needed, benazepril HCT 20/12.5 one tablet once a day.   FOLLOWUP: The patient has been advised to follow up with Dr. Candiss Norse and call back with any questions or concerns.   TOTAL TIME SPENT IN DISCHARGING THE PATIENT: Thirty-five minutes.    ____________________________ Tracie Harrier, MD vh:TT D: 03/09/2014 13:07:37 ET T: 03/09/2014 21:23:08 ET JOB#: [id number]  cc: Tracie Harrier, MD, <Dictator> Tracie Harrier MD ELECTRONICALLY SIGNED 03/11/2014 17:50

## 2014-08-02 NOTE — H&P (Signed)
PATIENT NAME:  [name] [name], [name] [name] MR#:  [id number] DATE OF BIRTH:  1936-03-21  DATE OF ADMISSION:  03/03/2014  REFERRING PHYSICIAN:  Gretchen Short. Beather Arbour, MD   PRIMARY CARE PHYSICIAN:  Glendon Axe, MD  ADMISSION DIAGNOSIS:  Non-ST elevation myocardial infarction.   HISTORY OF PRESENT ILLNESS:  This is a 79 year old African-American female who presents to the Emergency Department complaining of chest pain. The patient states that it began with minimal exertion as she walked to her refrigerator from the couch, where she had been resting most of the day. She opened the refrigerator door and felt a heat wave come over her face. The pain in her chest felt as if somebody were standing on her chest. She made her way back to the couch with a considerable amount of effort, where the pain gradually decreased, but she still feels as if her chest feels "heavy." She admits to some nausea. Both sensations lasted until the patient arrived in the Emergency Department via EMS. Her troponin was found to be elevated more than on previous evaluations, and she was started on therapeutic Lovenox. Due to these reasons, the Emergency Department called for admission for cardiac evaluation.   REVIEW OF SYSTEMS: CONSTITUTIONAL:  The patient denies fever but admits to generalized fatigue.  EYES:  Denies blurred vision or inflammation.  EARS, NOSE, AND THROAT:  Denies tinnitus or sore throat.  RESPIRATORY:  Denies cough or shortness of breath.  CARDIOVASCULAR:  Admits to chest pain but denies palpitations, orthopnea, or paroxysmal nocturnal dyspnea.  GASTROINTESTINAL:  Admits to nausea but denies vomiting, diarrhea, or abdominal pain.  GENITOURINARY:  Denies dysuria, increased frequency, or hesitancy of urination.  ENDOCRINE:  Denies polyuria or polydipsia.  HEMATOLOGIC AND LYMPHATIC:  Denies easy bruising or bleeding.  INTEGUMENT:  Denies rashes or lesions.  MUSCULOSKELETAL:  Denies myalgias or arthralgias.  NEUROLOGIC:  Denies  numbness in her extremities or dysarthria.  PSYCHIATRIC:  Denies depression or suicidal ideation.   PAST MEDICAL HISTORY:  Diabetes mellitus type 2, hypertension, and history of breast cancer.   PAST SURGICAL HISTORY:  Lumpectomy.   SOCIAL HISTORY:  The patient quit smoking 25 years ago. She denies any alcohol or drug use.   FAMILY HISTORY:  Significant for hypertension, diabetes type 2, and a sister who is deceased of breast cancer.   MEDICATIONS: 1.  Alprazolam 0.5 mg 1 tab p.o. b.i.d. as needed.  2.  Aspirin 325 mg 1 tab p.o. daily.  3.  Benazepril/hydrochlorothiazide 20 mg/12.5 mg 1 tab p.o. every morning.  4.  Carvedilol 3.125 mg 1 tab p.o. b.i.d.  5.  Fluticasone 50 mcg 1 to 2 sprays to each nostril once a day as needed.  6.  Gas-X extra strength 1 capsule p.o. as needed.  7.  Insulin glargine 65 units subcutaneously once a day at bedtime.  8.  Meclizine 12.5 mg 1 tablet p.o. 4 times a day as needed for nausea, vomiting, or motion sickness.  9.  Metformin 1000 mg 1-1/2 tablets every morning and 1 tablet in the evening by mouth.  10.  Nitroglycerin 0.4 mg sublingual tablets 1 tablet every 5 minutes as needed for chest pain x 3 doses.  11.  Pantoprazole 40 mg delayed release 1 tab p.o. daily.  12.  Tylenol extra strength 500 mg 1 tablet p.o. every 6 hours as needed for fever.   ALLERGIES:  CODEINE, INDOCIN, NONSTEROIDAL ANTI-INFLAMMATORY DRUGS, PENICILLIN, AND TRAMADOL.   PERTINENT LABORATORY RESULTS AND RADIOGRAPHIC FINDINGS:  Serum glucose is 158, BUN  10, creatinine 1.12, sodium 140, potassium 4, chloride 103, bicarbonate 27, and calcium is 9. Troponin is 0.08 and remained the same value on the second blood draw 4 hours later. White blood cell count is 7, hemoglobin 13.5, hematocrit 41.6, platelet count 222,000.   Chest x-ray shows no active disease.   PHYSICAL EXAMINATION: VITAL SIGNS:  Temperature is 98, pulse 69, respirations 18, blood pressure 124/72, and pulse oximetry is  98% on room air.  GENERAL:  The patient is alert and oriented x 3 and in no apparent distress.  HEENT:  Normocephalic, atraumatic. Pupils are equal, round, and reactive to light and accommodation. Extraocular movements are intact. Mucous membranes are moist.  NECK:  Trachea is midline. No adenopathy. No carotid bruits.  CHEST:  Symmetric and atraumatic.  CARDIOVASCULAR:  Regular rate and rhythm. Normal S1 and S2. No rubs, clicks, or murmurs appreciated.  LUNGS:  Clear to auscultation bilaterally. Normal effort and excursion.  ABDOMEN:  Positive bowel sounds. Soft, nontender, nondistended. No hepatosplenomegaly.  GENITOURINARY:  Deferred.  MUSCULOSKELETAL:  The patient moves all 4 extremities equally. There is 5/5 strength in upper and lower extremities bilaterally.  SKIN:  There are no rashes or lesions.  EXTREMITIES:  No clubbing, cyanosis, or edema.  NEUROLOGIC:  Cranial nerves II through XII are grossly intact.  PSYCHIATRIC:  Mood is normal. Affect is congruent.   ASSESSMENT AND PLAN:  This is a 79 year old female admitted for non-ST elevation myocardial infarction.   1.  Non-ST elevation myocardial infarction. The patient has chest pain and elevated troponin above historical levels that had been considered due to ischemic demand. She has been given therapeutic Lovenox and aspirin in the Emergency Department. We will continue to trend her cardiac enzymes and administer antiplatelet therapy. I have consulted cardiology as well.  2.  Hypertension. Continue benazepril and carvedilol.  3.  Diabetes type 2. Continue basal insulin. I have reduced this dose by 15% due to her inevitable n.p.o. status prior to her procedure. I have added sliding scale insulin to her regimen after meals.  4.  Deep vein thrombosis prophylaxis with heparin.  5.  Gastrointestinal prophylaxis. None.   CODE STATUS:  The patient is a full code.   TIME SPENT ON ADMISSION ORDERS AND PATIENT CARE:  Approximately 35  minutes.    ____________________________ Norva Riffle. Marcille Blanco, MD msd:nb D: 03/03/2014 04:29:11 ET T: 03/03/2014 06:40:47 ET JOB#: [id number]  cc: Norva Riffle. Marcille Blanco, MD, <Dictator> Norva Riffle Florena Kozma MD ELECTRONICALLY SIGNED 03/04/2014 0:21

## 2014-08-02 NOTE — Consult Note (Signed)
Brief Consult Note: Diagnosis: [place]/Elevated troponns.   Patient was seen by consultant.   Consult note dictated.   Recommend further assessment or treatment.   Orders entered.   Comments: [facility] [place] Elevated troponins HTN DM . PLAN ROMI ASA Tele NTG Bp control DM control Statin? Cath in am.  Electronic Signatures: Lujean Amel D (MD)  (Signed [phone] 07:05)  Authored: Brief Consult Note   Last Updated: 24-Nov-15 07:05 by Lujean Amel D (MD)

## 2014-08-02 NOTE — Consult Note (Signed)
PATIENT NAME:  [name], [name] MR#:  [id number] DATE OF BIRTH:  [birth year]  DATE OF CONSULTATION:  03/03/2014  REFERRING PHYSICIAN:   CONSULTING PHYSICIAN:  Dwayne D. Clayborn Bigness, MD  CARDIOLOGY CONSULTATION   PRIMARY PHYSICIAN: Dr. Candiss Norse  INDICATIONS: Elevated troponin, unstable angina, possible non-Q-wave myocardial infarction.   HISTORY OF PRESENT ILLNESS: The patient is a 79 year old African American female who presented to the Emergency Room with possible anginal symptoms. She got up, went to the refrigerator, and had a warm feeling across her chest, even though she opened her refrigerator door and detected cool air. She had chest discomfort, pain, weakness, and fatigue. She had a heavy feeling in her chest, and so came to the Emergency Room. Her troponins were found to be slightly elevated, so she was admitted for further evaluation and care.   REVIEW OF SYSTEMS: No blackouts, falls, or syncope. No nausea or vomiting. Denies fever, chills, sweats. No weight loss. No weight gain. No hemoptysis or hematemesis. No bright red blood per rectum. No vision change or hearing change. Denies sputum production. Denies cough.   PAST MEDICAL HISTORY: Diabetes, hypertension, breast cancer.  PRIOR SURGICAL HISTORY: Lumpectomy.  SOCIAL HISTORY: Quit smoking 25 years ago. Denies alcohol consumption.   FAMILY HISTORY: Hypertension, diabetes, breast cancer.   MEDICATIONS: Xanax 0.5 twice a day as needed, aspirin 325 mg a day, Benazepril/ hydrochlorothiazide 20/12.5 once a day, Coreg 3.125 twice a day, Fluticasone 1 spray twice a day, Gas-X as needed, insulin 5 units subcutaneous at bedtime, meclizine 12.5 mg up to 4 times a day as needed, nitroglycerin sublingual p.r.n., Protonix 40 mg once a day, Tylenol p.r.n.   ALLERGIES: CODEINE, ECOTRIN, NONSTEROIDAL ANTI-INFLAMMATORY DRUGS, PENICILLIN, TRAMADOL.   LABORATORY STUDIES: Glucose of 158, BUN of 10, creatinine of 1.12, sodium 140, potassium 4, chloride  103, bicarb 27. Troponin 0.08. White count of 7, hemoglobin of 13.5, hematocrit of 41.6, and platelet count 322,000.   PHYSICAL EXAMINATION:  VITAL SIGNS: Blood pressure 120/78, pulse 70, respiratory rate of 16, afebrile.  HEENT: Normocephalic, atraumatic. Pupils equal and reactive to light.  NECK: Soft and supple. No significant JVD, bruits or adenopathy.  LUNGS: Clear and resonant to percussion. No wheezes, rhonchi or rales.  HEART: Regular rhythm.  ABDOMEN: Positive bowel sounds. No significant rebound, guarding or tenderness.  EXTREMITIES: Within normal limits.  NEUROLOGIC: Intact. SKIN: Normal.    ASSESSMENT:  1.  Elevated troponins.  2.  Possible noncompliance  3.  Possible benign ischemia. 4.  Hypertension. 5.  Diabetes.  6.  Gastroesophageal reflux disease.  7.  Anxiety.   PLAN:  1.  Agree with rule out myocardial infarction and place on telemetry. Follow up cardiac enzymes. Follow up PTTs. anticoagulation. Nitrates if needed. Continue Coreg and beta Klostermann. Continue   . 2.  Would recommend invasive evaluation, since this is her second admission recently for similar complaints. Would recommend cardiac catheterization to tentatively rule out for significant coronary disease with elevated troponins and multiple risk factors.  3.  Continue blood pressure control with Coreg and Benazepril/hydrochlorothiazide. 4.  Diabetes. Continue sliding scale insulin therapy as necessary.  5.  For reflux, continue Protonix.  6.  Recommend lipid evaluation and possibly statin therapy as needed.  7.   GERD continue Protonix therapy for reflux symptoms also continue  Zantac and Gas-X.  We will base further evaluation on the results of the invasive cardiac studies.   Recent echocardiogram suggested normal LV function.   ____________________________ Loran Senters Clayborn Bigness, MD ddc:MT D: 03/04/2014  08:35:00 ET T: 03/04/2014 09:43:14 ET JOB#: [id number]  cc: Dwayne D. Clayborn Bigness, MD,  <Dictator> Yolonda Kida MD ELECTRONICALLY SIGNED 03/14/2014 16:46

## 2014-08-02 NOTE — H&P (Signed)
PATIENT NAME:  [name] [name], [name] [name] MR#:  [id number] DATE OF BIRTH:  1936/02/27  DATE OF ADMISSION:  10/02/2013  PRIMARY CARE PHYSICIAN: Dr. Glendon Axe from [facility].   REFERRING PHYSICIAN: Lisa Roca, MD.   HISTORY OF PRESENT ILLNESS: This is a very nice 79 year old female with history of anxiety, depression. The patient has also breast cancer, diabetes, hypertension and hyperlipidemia. The patient comes today with chief complaint of nausea, vomiting and what appears to be a panic attack. The patient has been treated for these repeated panic attacks with the same characteristics by Dr. Bridgett Larsson, outpatient psychiatrist. She states that today she just feels really shaky, clammy, nervous and had a couple of episodes of vomiting. It was pretty much gastric content with white foamy saliva and some dry heaving. The patient states that this happens very often and that is the reason why she has been taking medications prescribed by Dr. Bridgett Larsson. She has been here in company of some family. Her granddaughter tells [place] that this happens occasionally. At this moment, she is doing okay. She has no symptoms. Her nausea has resolved but incidentally we find out that her troponin is slightly elevated for which we are going to admit her for observation for elevation of troponin and atypical presentation of unstable angina. The patient denies any chest pain at this moment. She said that she had epigastric pain earlier, but lasted for less than an hour.  REVIEW OF SYSTEMS:   CONSTITUTIONAL: No fever, weight loss, weight gain.  EYES: No blurry vision, double vision.  EARS, NOSE, THROAT: No difficulty swallowing.  RESPIRATORY: No shortness of breath, cough or hemoptysis.  CARDIOVASCULAR: No chest pain. Positive epigastric pain today x 1. Cannot describe the characteristics. The patient said it just came and go and it felt like dullness. GASTROINTESTINAL: Positive nausea, positive vomiting. Positive for epigastric pain as  described above. No diarrhea. No hematemesis.  No melena.  GENITOURINARY: No dysuria or hematuria. HEMATOLOGIC AND LYMPHATIC: No anemia, easy bruising. GYNECOLOGIC: No breast masses. The patient has history of breast cancer status post lumpectomy and radiation therapy.  ENDOCRINE: No polyuria, polydipsia or polyphagia. MUSCULOSKELETAL: No significant neck pain, back pain.  NEUROLOGIC: No numbness, tingling, CVAs, TIAs or vertigo.  PSYCHIATRIC: Positive for panic attacks. Positive depression. Positive anxiety.  PAST MEDICAL HISTORY:  1.  Type 2 insulin-dependent diabetes.  2.  Hypertension.  3.  Anxiety.  4.  Panic attacks.  5.  Breast cancer.  6.  Osteoarthritis.  7.  Hyperlipidemia. 8.  Depression   ALLERGIES: CODEINE, TRAMADOL, PENICILLIN, INDOCIN AND NSAIDS.   SURGICAL HISTORY: 1.  Knee arthroscopy on the left side.  2.  Lumpectomy of the right breast status post radiation therapy as well.  Denies any other surgeries.   SOCIAL HISTORY: The patient used to smoke, but quit in 1990. She does not drink. She is retired.   FAMILY HISTORY: Her sister had breast cancer. No heart attacks in the family.   CURRENT MEDICATIONS: Include Tylenol extra strength 500 mg tablets, metformin 1500 mg once a day, meclizine 12.5 mg 4 times daily, insulin glargine Lantus 65 units at bedtime, Gas-X as needed for abdominal pain, fluticasone 50 mcg 2 sprays each nostril, benazepril with hydrochlorothiazide 20/12.5 mg once a day, alprazolam 0.5 mg twice daily.   PHYSICAL EXAMINATION: VITAL SIGNS: Blood pressure 130/68, pulse 69, respirations 18, temperature 97.4, oxygen saturation 99% on room air.  GENERAL: Alert and oriented x 3. No acute distress. No respiratory distress. Hemodynamically stable.  HEENT:  Pupils are equal, round and reactive. Extraocular movements are intact. Mucosa moist. Anicteric sclerae. Pink conjunctivae. No oral lesions. No oropharyngeal exudates.  NECK: Supple. No JVD. No  thyromegaly. No adenopathy. No carotid bruits. No rigidity.  CARDIOVASCULAR: Regular rate and rhythm. No murmurs, rubs or gallops.  LUNGS: Clear without any wheezing or crepitus. No use of accessory muscles.  ABDOMEN: Soft, nontender, nondistended. No hepatosplenomegaly. No masses. Bowel sounds are positive.  EXTREMITIES: No edema, cyanosis or clubbing.  SKIN: No rashes or petechiae.  MUSCULOSKELETAL: No significant joint abnormalities, deformity or swelling.  LYMPHATIC: Negative for lymphadenopathy in the neck or supraclavicular area.  VASCULAR: Pulses +2. Capillary refill less than 3.  NEUROLOGIC: Cranial nerves II through XII intact. Strength is 5/5 in all 4 extremities.  PSYCHIATRIC: No agitation. The patient is alert, oriented x 3 at this moment, although as mention of possible dementia of early stages.  DIAGNOSTIC RESULTS:  EKG shows bifascicular block with right bundle branch and partial left bundle branch.  The patient had the same findings in 2013.   Chest x-ray: No active abnormalities. Her troponin is 0.06, which is slightly elevated. Her previous troponin in 2013 was normal. LFTs are normal. Glucose is 189, creatinine 1.22, potassium 3.8, sodium 137.   Urinalysis negative for urinary tract infection.   ASSESSMENT AND PLAN: This is a very nice 79 year old female admitted with nausea, vomiting and epigastric pain.  1.  Nausea, vomiting and epigastric pain. At this moment, all this has resolved. The patient states that she has been having panic attacks and that this is the way that they manifest; so this is likely secondary to that and she has been taking medications for it. Cannot rule out the possibility of atypical presentation of unstable angina as the patient is diabetic and there is a possibility that she has dementia as the family expresses. For which we are going to admit her for observation for acute coronary syndrome/unstable angina as her troponin is slightly elevated.  2.   Elevation of troponin. As mentioned above, the patient is going to be monitored for an atypical presentation of unstable angina on a patient who is diabetic and could have some cognition disorder. For now, she is going to be on aspirin, Lovenox at prophylactic dose of 40 mg. Her GFR is 40, for which 40 mg should be a good dose for her for now. She is not on a beta Wynns, but she is not tachycardic. We are going to hold that. She is not on statin. We are going to check her lipid profile. At this moment, this could be just demand ischemia secondary to nausea and vomiting for which we are going to be cautious to observe. Continue aspirin. Continue as needed nitroglycerin. Since her heart rate is in the 60s, we are going to hold on the beta Mckelvin. Check lipid profile in the morning and hemoglobin A1c.  Monitor under telemetry.  3.  Diabetes. Apparently uncontrolled.  Check hemoglobin A1c. Since the patient has been nauseous and vomiting, we are going to decrease the dose to 50 units instead of 65 on her Lantus. Monitor closely. Hold if the patient has nausea, vomiting or not able to tolerate by mouth.  Accu-Cheks with insulin sliding scale.  4.  Hypertension. Continue benazepril with hydrochlorothiazide.  5.  History of anxiety. Continue Ativan.  I recommend the initiation of selective serotonin reuptake inhibitor. The patient used to take prednisone, although it is no longer on her medication list. We are going  to let Dr. Candiss Norse decide if the patient still needs it or not. 6.  Deep venous thrombosis prophylaxis with Lovenox.  7.  Gastrointestinal prophylaxis with Protonix.  I spent about 50 minutes with this patient.    ____________________________ Carlton Sink, MD rsg:ce D: 10/02/2013 14:03:33 ET T: 10/02/2013 14:20:07 ET JOB#: [id number]  cc: Williston Sink, MD, <Dictator> ROBERTO America Brown MD ELECTRONICALLY SIGNED 10/04/2013 15:14

## 2014-08-02 NOTE — Discharge Summary (Signed)
PATIENT NAME:  [name] [name], [name] [name] MR#:  [id number] DATE OF BIRTH:  1936/01/16  DATE OF ADMISSION:  03/03/2014 DATE OF DISCHARGE:  03/04/2014  DISCHARGE DIAGNOSES:  1. Non-ST-elevated myocardial infarction.  2. Urinary tract infection.  3. Dizziness, near syncope.   DISCHARGE MEDICATIONS: Per [facility], medication reconciliation system. Basically, she will be on her home medications along with pravastatin 20 mg a day which will be begun and Cipro 500 b.i.d. as well.   HISTORY AND PHYSICAL: Please see detailed history and physical done on admission.   HOSPITAL COURSE: The patient was admitted with a near syncopal event, some chest pain. She ruled in with mildly elevated troponins. Cardiology consult to do the cardiac catheterization and she had normal LV function with mild coronary disease only. She had no further chest pain, shortness of breath throughout the hospitalization. She did have positive urinalysis on admission, so she will be treated to Cipro as noted. Statin has been added. She had mild hyperglycemia, but her diabetes was controlled with a hemoglobin A1c of 7.3 when here. Given the fact that her catheterization was negative, I discussed possibly patient staying and having carotid Dopplers and possibly even an MRI of her brain; however, she adamantly did not want to do that and wished to go home with further testing as an outpatient when she sees Dr. Candiss Norse, her primary care doctor, who was sick today and I was seeing her for. Given that she was stable for 36 hours and telemetry had been normal, I felt this was reasonable. She agrees to call with any further complications even though she knows it would be better if she stayed.    TIME SPENT: It took approximately 35 minutes to do all discharge tasks today, including seeing her and evaluating her for a routine hospital visit earlier today.     ____________________________ Ocie Cornfield. Ouida Sills, MD mwa:bm D: 03/04/2014 17:27:55 ET T: 03/04/2014  22:35:41 ET JOB#: [id number]  cc: Ocie Cornfield. Ouida Sills, MD, <Dictator> Kirk Ruths MD ELECTRONICALLY SIGNED 03/10/2014 14:30

## 2014-08-02 NOTE — Discharge Summary (Signed)
Dates of Admission and Diagnosis:  Date of Admission 02-Oct-2013   Date of Discharge 03-Oct-2013   Admitting Diagnosis Abdominal pain, borderline elevated Troponins, diabetes, hypertension   Final Diagnosis Abdominal pain, borderline elevated Troponins, diabetes, hypertension   Discharge Diagnosis 1 Abdominal pain   2 borderline elevated Troponins   3 DM   4 HTN    Chief Complaint/History of Present Illness Elderly lady presented to the ED c/o abdominal pain and nausea. ED work up was remarkable for borderline elevated troponin of 0.06 on initial labs.   Allergies:  Codeine: Alt Ment Status, Dizzy/Fainting  Tramadol: N/V/Diarrhea  Penicillin: Unknown  Indocin: Unknown  NSAIDS: Unknown  Hepatic:  24-Jun-15 10:15   Bilirubin, Total 0.5  Alkaline Phosphatase 49 (45-117 NOTE: New Reference Range 03/01/13)  SGPT (ALT) 16  SGOT (AST) 22  Total Protein, Serum 7.3  Albumin, Serum 3.8  Routine Micro:  24-Jun-15 10:40   Micro Text Report URINE CULTURE   COMMENT                   NO GROWTH IN 8-12 HOURS   ANTIBIOTIC                       Culture Comment NO GROWTH IN 8-12 HOURS  Result(s) reported on 03 Oct 2013 at 08:59AM.  Routine Chem:  24-Jun-15 09:52   Hemoglobin A1c ([facility])  8.0 (The American Diabetes Association recommends that a primary goal of therapy should be <7% and that physicians should reevaluate the treatment regimen in patients with HbA1c values consistently >8%.)    10:15   Glucose, Serum  189  BUN 9  Creatinine (comp) 1.22  Sodium, Serum 137  Potassium, Serum 3.8  Chloride, Serum 102  CO2, Serum 26  Calcium (Total), Serum 9.3  Anion Gap 9  Osmolality (calc) 278  eGFR (African American)  50  eGFR (Non-African American)  43 (eGFR values <28m/min/1.73 m2 may be an indication of chronic kidney disease (CKD). Calculated eGFR is useful in patients with stable renal function. The eGFR calculation will not be reliable in acutely ill patients when  serum creatinine is changing rapidly. It is not useful in  patients on dialysis. The eGFR calculation may not be applicable to patients at the low and high extremes of body sizes, pregnant women, and vegetarians.)  Lipase 83 (Result(s) reported on 02 Oct 2013 at 10:54AM.)  Cardiac:  24-Jun-15 10:15   Troponin I  0.06 (0.00-0.05 0.05 ng/mL or less: NEGATIVE  Repeat testing in 3-6 hrs  if clinically indicated. >0.05 ng/mL: POTENTIAL  MYOCARDIAL INJURY. Repeat  testing in 3-6 hrs if  clinically indicated. NOTE: An increase or decrease  of 30% or more on serial  testing suggests a  clinically important change)  CK, Total 76 (26-192 NOTE: NEW REFERENCE RANGE  05/13/2013)  CPK-MB, Serum 0.5 (Result(s) reported on 02 Oct 2013 at 11:59AM.)    14:07   Troponin I  0.07 (0.00-0.05 0.05 ng/mL or less: NEGATIVE  Repeat testing in 3-6 hrs  if clinically indicated. >0.05 ng/mL: POTENTIAL  MYOCARDIAL INJURY. Repeat  testing in 3-6 hrs if  clinically indicated. NOTE: An increase or decrease  of 30% or more on serial  testing suggests a  clinically important change)    18:29   Troponin I  0.06 (0.00-0.05 0.05 ng/mL or less: NEGATIVE  Repeat testing in 3-6 hrs  if clinically indicated. >0.05 ng/mL: POTENTIAL  MYOCARDIAL INJURY. Repeat  testing in 3-6 hrs if  clinically indicated. NOTE: An increase or decrease  of 30% or more on serial  testing suggests a  clinically important change)  Routine UA:  24-Jun-15 10:40   Color (UA) Yellow  Clarity (UA) Clear  Glucose (UA) Negative  Bilirubin (UA) Negative  Ketones (UA) Trace  Specific Gravity (UA) 1.015  Blood (UA) Negative  pH (UA) 5.0  Protein (UA) Negative  Nitrite (UA) Negative  Leukocyte Esterase (UA) Trace (Result(s) reported on 02 Oct 2013 at 11:37AM.)  RBC (UA) <1 /HPF  WBC (UA) 4 /HPF  Bacteria (UA) TRACE  Epithelial Cells (UA) 1 /HPF  Hyaline Cast (UA) 19 /LPF (Result(s) reported on 02 Oct 2013 at 11:37AM.)   Routine Hem:  24-Jun-15 09:52   WBC (CBC) 7.4  RBC (CBC) 4.20  Hemoglobin (CBC) 13.0  Hematocrit (CBC) 38.4  Platelet Count (CBC) 251 (Result(s) reported on 02 Oct 2013 at 10:04AM.)  MCV 91  MCH 31.0  MCHC 33.9  RDW 13.7  25-Jun-15 04:28   WBC (CBC) 5.9  RBC (CBC) 4.06  Hemoglobin (CBC) 12.6  Hematocrit (CBC) 37.0  Platelet Count (CBC) 230  MCV 91  MCH 31.0  MCHC 33.9  RDW 13.7  Neutrophil % 62.4  Lymphocyte % 28.5  Monocyte % 7.3  Eosinophil % 1.2  Basophil % 0.6  Neutrophil # 3.7  Lymphocyte # 1.7  Monocyte # 0.4  Eosinophil # 0.1  Basophil # 0.0 (Result(s) reported on 03 Oct 2013 at 05:16AM.)   PERTINENT RADIOLOGY STUDIES: XRay:    24-Jun-15 12:35, Chest Portable Single View  Chest Portable Single View   REASON FOR EXAM:    ABNORMAL TROPONIN  COMMENTS:       PROCEDURE: DXR - DXR PORTABLE CHEST SINGLE VIEW  - Oct 02 2013 12:35PM     CLINICAL DATA:  ABNORMAL TROPONIN    EXAM:  PORTABLE CHEST - 1 VIEW    COMPARISON:  Portal chest radiograph 11/13/2009    FINDINGS:  Low lung volumes. The heart size and mediastinal contours are within  normal limits. Atherosclerotic calcifications within the aorta. Both  lungs are clear. Mild degenerative changes in the shoulders. No  acute osseous abnormalities.     IMPRESSION:  No active disease.      Electronically Signed    By: Margaree Mackintosh M.D.    On: 10/02/2013 12:52         Verified By: Mikki Santee, M.D., MD   Pertinent Past History:  Pertinent Past History DM HTN   Hospital Course:  Hospital Course Patient was placed on observation in telemetry unit. She was started on Lovenox and Aspirin. Beta-Knebel was initially held for HR of 60. Serial troponins were 0.06, 0.07 and 0.06, borderline elevated. Patient did not c/o any chest pain. No further c/o nausea or abdominal pain. ACEI and HCTZ were continued for hypertension. She received Lantus and SSI for diabetes. HbA1c 8.0 on labs. Patient is  anxious to go home. She does not want to stay in the hospital for stress testing or cardiology evaluation. Prior work up is remarkable for negative stress ECHO on 01/02/2006. Start Coreg 3.125 mg BID, Pravastatin 10 mg daily and continue Aspirin. Rx handed to patient. Patient will have stress test as out patient.   Condition on Discharge Guarded   DISCHARGE INSTRUCTIONS HOME MEDS:  Medication Reconciliation: Patient's Home Medications at Discharge:     Medication Instructions  metformin 1000 mg oral tablet  1.5 tabs in morning and 1 tab(s) orally in the evening every  day   benazepril-hydrochlorothiazide 20 mg-12.5 mg oral tablet  1 tab(s) orally once a day (in the morning)   fluticasone 50 mcg/inh nasal spray  2 spray(s) each nostril once a day, As Needed   alprazolam 0.5 mg oral tablet  1 tab(s) orally 2 times a day, As Needed   meclizine 12.5 mg oral tablet  1 tab(s) orally 4 times a day, As Needed   tylenol caplet extra strength 500 mg oral tablet  1 tab(s) orally every 6 hours, As Needed   insulin glargine 100 units/ml subcutaneous solution  65 unit(s) subcutaneous once a day (at bedtime)   gas-x extra strength 125 mg oral capsule  1 cap(s) orally , As Needed   nitroglycerin 0.4 mg sublingual tablet  1 tab(s) sublingual , As needed, chest pain   aspirin 325 mg oral delayed release tablet  1 tab(s) orally once a day   pantoprazole 40 mg oral delayed release tablet  1 tab(s) orally once a day   carvedilol 3.125 mg oral tablet  1 tab(s) orally 2 times a day    PRESCRIPTIONS: PRINTED AND GIVEN TO PATIENT/FAMILY   Physician's Instructions:  Diet Low Sodium  Low Fat, Low Cholesterol  Carbohydrate Controlled (ADA) Diet   Activity Limitations As tolerated   Return to Work Not Applicable   Time frame for Follow Up Appointment 1-2 weeks  Dr. Glendon Axe   Time frame for Follow Up Appointment 1-2 weeks  [facility] cardiology   Other Comments Stress test as out patient. Resume statin previously  prescribed.     Candiss Norse, Jasmine(Attending Physician): [facility], [address], [place], [place] [address], [place] [phone]  Electronic Signatures: Glendon Axe (MD)  (Signed 25-Jun-15 13:13)  Authored: ADMISSION DATE AND DIAGNOSIS, CHIEF COMPLAINT/HPI, Allergies, PERTINENT LABS, PERTINENT RADIOLOGY STUDIES, PERTINENT PAST [place], PATIENT INSTRUCTIONS, Follow Up Physician   Last Updated: 25-Jun-15 13:13 by Glendon Axe (MD)

## 2014-08-06 NOTE — H&P (Signed)
PATIENT NAME:  [name] [name], [name] [name] MR#:  [id number] DATE OF BIRTH:  05-13-1935  DATE OF ADMISSION:  03/05/2014  ADMITTING PHYSICIAN:  Gladstone Lighter, MD  PRIMARY CARE PHYSICIAN: Glendon Axe, MD  CHIEF COMPLAINT: Weakness and poor appetite.   HISTORY OF PRESENT ILLNESS: The patient is a 79 year old African American female with past medical history significant for hypertension, diabetes, who presented to the hospital on 03/04/2014, for weakness and near syncope and had positive troponins, mildly elevated. She was seen by Dr. Clayborn Bigness, had a cardiac catheterization which showed only minimal coronary artery disease, otherwise normal coronary and was discharged. At the time of discharge, the patient was advised by the rounding physician, Dr. Ouida Sills, to stay to get further workup for her syncope including an MRI and carotid Dopplers. However, the patient refused and was discharged. She comes back saying she still feels weak. She has been having vertigo whenever she stands up and having to hold onto something to even stand up. She was also discharged on Cipro for possible  urinary tract infection yesterday. Her urine today looks clear. Labs look okay other than mild hypokalemia. She is complaining of mostly poor appetite going on for almost 2 months, decreased strength in the last week and also weight loss of about 60 pounds over 2 months, according to the granddaughter. She denies any fever or chills, complains of nausea. No diarrhea. No further chest pain, dyspnea or any other symptoms.   PAST MEDICAL HISTORY:  1. Hypertension.  2. Insulin-dependent diabetes mellitus.  3. History of breast cancer status post lumpectomy and radiation, currently in remission.   PAST SURGICAL HISTORY:  1. Left knee surgery.  2. Right breast lumpectomy.   ALLERGIES: CODEINE, INDOCIN, NSAIDS, PENICILLIN, TRAMADOL.   CURRENT HOME MEDICATIONS:  1. Xanax 0.5 mg p.o. b.i.d. p.r.n. for anxiety.  2. Aspirin 325 mg p.o.  daily.  3. Benazepril hydrochlorothiazide 20/12.5 mg p.o. daily.  4. Coreg 3.125 mg p.o. b.i.d.  5. Ciprofloxacin 500 mg p.o. b.i.d., which was started on 03/04/2014. 6. Flonase 150 mg inhalation 2 sprays to each nostril once a day.  7. Lantus 65 units subcutaneous at bedtime.  8. Meclizine 12.5 mg 4 times a day as needed for vertigo.  9. Metformin 1000 mg p.o. twice a day. 10. Sublingual nitroglycerin 0.4 mg p.r.n. for chest pain.  11. Tylenol 500 mg p.o. q.6 hours p.r.n. for pain.   SOCIAL HISTORY: Lives at home by herself, quit smoking about 25 years ago. Denies any alcohol or drug use.   FAMILY HISTORY: Significant for diabetes and hypertension. One of the sisters has breast cancer as well.   REVIEW OF SYSTEMS: CONSTITUTIONAL: Positive for fatigue, weight loss. No fever or chills.  EYES: No blurred vision, double vision, inflammation or glaucoma. Positive for history of cataracts.  ENT: No tinnitus, ear pain, hearing loss, epistaxis or discharge.  RESPIRATORY: No cough, wheeze, hemoptysis or chronic obstructive pulmonary disease.  CARDIOVASCULAR: No chest pain, orthopnea, edema, arrhythmia, palpitations, or syncope.  GASTROINTESTINAL: No nausea, vomiting, abdominal pain, or diarrhea. No rectal bleed. Positive for loss of appetite.  GENITOURINARY: No dysuria, hematuria, renal calculus, frequency or incontinence.  ENDOCRINE: No polyuria, nocturia, thyroid problems, heat or cold intolerance.  HEMATOLOGY: No anemia, easy bruising or bleeding.  SKIN: No acne, rash or lesions.  MUSCULOSKELETAL: No neck, back, shoulder pain, arthritis or gout.  NEUROLOGIC: No numbness, weakness, CVA, transient ischemic attack or seizures.  PSYCHOLOGICAL: No anxiety, insomnia or depression.   PHYSICAL EXAMINATION:  VITAL SIGNS: Temperature  97.4 degrees Fahrenheit, pulse 69, respirations 18, blood pressure 138/80, pulse oximetry 100% on room air.  GENERAL: Alert and well-built, well-nourished female lying  in bed, not in any acute distress.  HEENT: Normocephalic, atraumatic. Pupils equal, round, reacting to light. Postsurgical pupils noted. Extraocular movements intact. Oropharynx clear without erythema, mass, or exudates.  NECK: Supple. No thyromegaly, JVD or carotid bruits. No lymphadenopathy. Normal range of motion without pain.  LUNGS: Moving air bilaterally. No wheeze or crackles. No use of accessory muscles for breathing.  CARDIOVASCULAR: S1, S2 noted, a 3/6 systolic murmur present. No rubs or gallops.  ABDOMEN: Soft, nontender, nondistended. No hepatosplenomegaly. Normal bowel sounds.  EXTREMITIES: No pedal edema. No clubbing or cyanosis. 2+ dorsalis pedis pulses palpable bilaterally.  SKIN: No acne, rash or lesions.  NEUROLOGIC: Cranial nerves are intact. No focal motor or sensory deficits. I checked the gait of the patient, she is steady, negative Romberg sign.  PSYCHOLOGICAL: The patient is awake, alert, oriented x3.   LABORATORY DATA:  1. WBC is 5100, hemoglobin 13.7, hematocrit 41.6, platelet count 255,000.  2. Sodium 140, potassium 3.3, chloride 103, bicarbonate 26, BUN 9, creatinine 0.99 and glucose 202, and calcium of 8.8.  3. ALT 19, AST 23, alkaline phosphatase 60, total bilirubin 0.6 and albumin of 3.9.  4. INR is 1.1. Troponin is 0.07. CK 127, CK-MB 0.9.  5. Urinalysis negative for any infection.  6. CT of the head showing no acute intracranial abnormality, mild atrophy, chronic microvascular ischemic changes, mild atherosclerotic calcifications noted.  7. EKG is showing normal sinus rhythm, right bundle branch block, bifascicular block, heart rate of 72. No acute ST-T wave changes.   ASSESSMENT AND PLAN: A 79 year old female with history of hypertension, diabetes, and recent admission to [facility] for elevated troponins and had a normal cardiac catheterization done on 03/04/2014, discharged home, comes back with weakness and presyncope.  1. Weakness,  presyncope. Could be from generalized weakness, poor appetite, loss of weight. We will get physical therapy consult. IV fluids. However distal suspicion for transient ischemic attacks, so we will admit and do carotid Dopplers, MRA of the brain  and monitor.  2. Elevated troponin. Seen by Dr. Clayborn Bigness just had a cardiac catheterization yesterday, which showed minimal coronary artery disease and normal coronaries, so no further testing recommended at this time.  3. Hypokalemia. Is being replaced.  4. Hypertension. Continue home medications.  5. Diabetes mellitus. Hold metformin as the patient just had catheterization and decrease her insulin dose because of poor p.o. intake. 6. Urinary tract infection. Continue Levaquin. 7. Deep vein thrombosis prophylaxis. Subcutaneous heparin. 8. CODE STATUS:  PATIENT FULL CODE.   TIME SPENT ON ADMISSION: 50 minutes.     ____________________________ Gladstone Lighter, MD [id number] D: 03/05/2014 16:42:30 ET T: 03/05/2014 17:47:44 ET JOB#: [id number]  cc: Gladstone Lighter, MD, <Dictator> Glendon Axe, MD Gladstone Lighter MD ELECTRONICALLY SIGNED 04/15/2014 13:36

## 2015-01-21 ENCOUNTER — Other Ambulatory Visit: Payer: Self-pay | Admitting: Surgery

## 2015-01-21 DIAGNOSIS — Z853 Personal history of malignant neoplasm of breast: Secondary | ICD-10-CM

## 2015-02-04 ENCOUNTER — Other Ambulatory Visit: Payer: Self-pay

## 2015-02-04 ENCOUNTER — Ambulatory Visit: Payer: Self-pay

## 2016-02-01 ENCOUNTER — Emergency Department
Admission: EM | Admit: 2016-02-01 | Discharge: 2016-02-01 | Disposition: A | Discharge status: Home / Self Care | Payer: Medicare Other | Attending: Emergency Medicine | Admitting: Emergency Medicine

## 2016-02-01 ENCOUNTER — Emergency Department: Payer: Medicare Other

## 2016-02-01 ENCOUNTER — Encounter: Payer: Self-pay | Admitting: Emergency Medicine

## 2016-02-01 DIAGNOSIS — R0602 Shortness of breath: Secondary | ICD-10-CM

## 2016-02-01 DIAGNOSIS — Z87891 Personal history of nicotine dependence: Secondary | ICD-10-CM | POA: Insufficient documentation

## 2016-02-01 DIAGNOSIS — R062 Wheezing: Secondary | ICD-10-CM | POA: Diagnosis not present

## 2016-02-01 DIAGNOSIS — Z7984 Long term (current) use of oral hypoglycemic drugs: Secondary | ICD-10-CM | POA: Diagnosis not present

## 2016-02-01 DIAGNOSIS — Z853 Personal history of malignant neoplasm of breast: Secondary | ICD-10-CM | POA: Diagnosis not present

## 2016-02-01 DIAGNOSIS — Z7982 Long term (current) use of aspirin: Secondary | ICD-10-CM | POA: Insufficient documentation

## 2016-02-01 DIAGNOSIS — Z79899 Other long term (current) drug therapy: Secondary | ICD-10-CM | POA: Diagnosis not present

## 2016-02-01 DIAGNOSIS — E119 Type 2 diabetes mellitus without complications: Secondary | ICD-10-CM | POA: Diagnosis not present

## 2016-02-01 DIAGNOSIS — R7989 Other specified abnormal findings of blood chemistry: Secondary | ICD-10-CM | POA: Insufficient documentation

## 2016-02-01 DIAGNOSIS — R778 Other specified abnormalities of plasma proteins: Secondary | ICD-10-CM

## 2016-02-01 DIAGNOSIS — I10 Essential (primary) hypertension: Secondary | ICD-10-CM | POA: Diagnosis not present

## 2016-02-01 HISTORY — DX: Malignant (primary) neoplasm, unspecified: C80.1

## 2016-02-01 HISTORY — DX: Essential (primary) hypertension: I10

## 2016-02-01 HISTORY — DX: Type 2 diabetes mellitus without complications: E11.9

## 2016-02-01 LAB — CBC WITH DIFFERENTIAL/PLATELET
Basophils Absolute: 0.1 10*3/uL (ref 0–0.1)
Basophils Relative: 1 %
EOS PCT: 1 %
Eosinophils Absolute: 0 10*3/uL (ref 0–0.7)
HCT: 41.9 % (ref 35.0–47.0)
HEMOGLOBIN: 13.9 g/dL (ref 12.0–16.0)
LYMPHS ABS: 1.6 10*3/uL (ref 1.0–3.6)
LYMPHS PCT: 22 %
MCH: 30.8 pg (ref 26.0–34.0)
MCHC: 33.2 g/dL (ref 32.0–36.0)
MCV: 92.9 fL (ref 80.0–100.0)
Monocytes Absolute: 0.4 10*3/uL (ref 0.2–0.9)
Monocytes Relative: 5 %
NEUTROS PCT: 71 %
Neutro Abs: 5.3 10*3/uL (ref 1.4–6.5)
Platelets: 234 10*3/uL (ref 150–440)
RBC: 4.51 MIL/uL (ref 3.80–5.20)
RDW: 13.7 % (ref 11.5–14.5)
WBC: 7.4 10*3/uL (ref 3.6–11.0)

## 2016-02-01 LAB — COMPREHENSIVE METABOLIC PANEL
ALK PHOS: 49 U/L (ref 38–126)
ALT: 20 U/L (ref 14–54)
AST: 28 U/L (ref 15–41)
Albumin: 4.2 g/dL (ref 3.5–5.0)
Anion gap: 11 (ref 5–15)
BUN: 10 mg/dL (ref 6–20)
CALCIUM: 9.5 mg/dL (ref 8.9–10.3)
CO2: 25 mmol/L (ref 22–32)
CREATININE: 0.88 mg/dL (ref 0.44–1.00)
Chloride: 103 mmol/L (ref 101–111)
Glucose, Bld: 267 mg/dL — ABNORMAL HIGH (ref 65–99)
Potassium: 4.4 mmol/L (ref 3.5–5.1)
Sodium: 139 mmol/L (ref 135–145)
Total Bilirubin: 0.5 mg/dL (ref 0.3–1.2)
Total Protein: 7.3 g/dL (ref 6.5–8.1)

## 2016-02-01 LAB — TROPONIN I
TROPONIN I: 0.03 ng/mL — AB (ref <0.03)
TROPONIN I: 0.03 ng/mL — AB (ref <0.03)

## 2016-02-01 MED ORDER — ASPIRIN 325 MG PO TABS: 325.0000 mg | ORAL_TABLET | Freq: Once | ORAL | Status: DC

## 2016-02-01 MED ORDER — ALBUTEROL SULFATE (2.5 MG/3ML) 0.083% IN NEBU
5.0000 mg | INHALATION_SOLUTION | Freq: Once | RESPIRATORY_TRACT | Status: AC
  Administered 2016-02-01: 5 mg via RESPIRATORY_TRACT
  Filled 2016-02-01: qty 6

## 2016-02-01 MED ORDER — ASPIRIN EC 81 MG PO TBEC: 81.0000 mg | DELAYED_RELEASE_TABLET | Freq: Every day | ORAL | 0 refills | Status: AC

## 2016-02-01 MED ORDER — IOPAMIDOL (ISOVUE-370) INJECTION 76%
75.0000 mL | Freq: Once | INTRAVENOUS | Status: AC | PRN
  Administered 2016-02-01: 75 mL via INTRAVENOUS

## 2016-02-01 MED ORDER — ASPIRIN 81 MG PO CHEW
324.0000 mg | CHEWABLE_TABLET | Freq: Once | ORAL | Status: AC
  Administered 2016-02-01: 324 mg via ORAL

## 2016-02-01 MED ORDER — ASPIRIN 81 MG PO CHEW
CHEWABLE_TABLET | ORAL | Status: AC
  Administered 2016-02-01: 324 mg via ORAL
  Filled 2016-02-01: qty 4

## 2016-02-01 MED ORDER — ESOMEPRAZOLE MAGNESIUM 40 MG PO CPDR: 40.0000 mg | DELAYED_RELEASE_CAPSULE | Freq: Every day | ORAL | 1 refills | Status: DC

## 2016-02-01 NOTE — ED Triage Notes (Signed)
"  When I take a breath, I hear a squealing in my chest."  Patient states symptoms began last night.  Denies pain.  Denies cough.  States she has some sinus congestion.

## 2016-02-01 NOTE — Discharge Instructions (Signed)
Take ASA 81 mg daily.   Take nexium 40 mg daily.   See Dr. Saralyn Pilar for follow up. Call office for appointment   Return to ER if you have worse chest pain, shortness of breath, wheezing

## 2016-02-01 NOTE — ED Provider Notes (Signed)
[facility] Provider Note   CSN: [id number] Arrival date & time: 02/01/16  0915     History   Chief Complaint Chief Complaint  Patient presents with  . Wheezing    HPI [name] [name] is a 80 y.o. female hx of breast cancer, DM, HTN here with SOB, wheezing. Patient states that for the last 2-3 days she noticed some sinus congestion. Sometimes at night, she has some shortness of breath. She describes as some kind a rattling sound in her chest. She states that sometimes she had reflux before but is not currently on any medicines. Prior history of breast cancer not currently getting any treatments.   The history is provided by the patient.    Past Medical History:  Diagnosis Date  . Cancer [facility])    left breast cancer  . Diabetes mellitus without complication ([place])   . Hypertension     There are no active problems to display for this patient.   Past Surgical History:  Procedure Laterality Date  . BREAST SURGERY     right lumpectomy  . KNEE SURGERY     bilateral    OB History    No data available       Home Medications    Prior to Admission medications   Medication Sig Start Date End Date Taking? Authorizing Provider  acetaminophen (TYLENOL) 325 MG tablet Take 650 mg by mouth every 6 (six) hours as needed.   Yes Historical Provider, MD  aspirin 81 MG chewable tablet Chew 81 mg by mouth daily.   Yes Historical Provider, MD  benazepril-hydrochlorthiazide (LOTENSIN HCT) 20-12.5 MG tablet Take 1 tablet by mouth daily. 01/11/16  Yes Historical Provider, MD  glimepiride (AMARYL) 4 MG tablet Take 1 tablet by mouth 2 (two) times daily. 12/01/15  Yes Historical Provider, MD  memantine (NAMENDA) 10 MG tablet Take 2 tablets by mouth daily. At night 10/26/15  Yes Historical Provider, MD  metFORMIN (GLUCOPHAGE) 1000 MG tablet Take 1-1.5 tablets by mouth 2 (two) times daily. Take 1 & 1/2 tablet in the morning and 1 tablet in the evening 01/11/16  Yes Historical  Provider, MD  pravastatin (PRAVACHOL) 20 MG tablet Take 1 tablet by mouth daily. 01/02/16  Yes Historical Provider, MD    Family History No family history on file.  Social History Social History  Substance Use Topics  . Smoking status: Former Research scientist (life sciences)  . Smokeless tobacco: Never Used  . Alcohol use No     Allergies   Codeine   Review of Systems Review of Systems  Respiratory: Positive for shortness of breath and wheezing.   All other systems reviewed and are negative.    Physical Exam Updated Vital Signs BP (!) 151/55   Pulse 82   Temp 98.2 F (36.8 C) (Oral)   Resp 18   Ht 5\' 4"  (1.626 m)   Wt 170 lb (77.1 kg)   SpO2 99%   BMI 29.18 kg/m   Physical Exam  Constitutional: She is oriented to person, place, and time.  NAD   HENT:  Head: Normocephalic.  Eyes: EOM are normal. Pupils are equal, round, and reactive to light.  Neck: Normal range of motion. Neck supple.  Cardiovascular: Normal rate, regular rhythm and normal heart sounds.   Pulmonary/Chest: Effort normal.  Diminished throughout, no obvious wheezing   Abdominal: Soft. Bowel sounds are normal. She exhibits no distension. There is no tenderness. There is no guarding.  Musculoskeletal: Normal range of motion.  Neurological: She is alert  and oriented to person, place, and time.  Skin: Skin is warm.  Psychiatric: She has a normal mood and affect.  Nursing note and vitals reviewed.    ED Treatments / Results  Labs (all labs ordered are listed, but only abnormal results are displayed) Labs Reviewed  COMPREHENSIVE METABOLIC PANEL - Abnormal; Notable for the following:       Result Value   Glucose, Bld 267 (*)    All other components within normal limits  TROPONIN I - Abnormal; Notable for the following:    Troponin I 0.03 (*)    All other components within normal limits  CBC WITH DIFFERENTIAL/PLATELET    EKG  EKG Interpretation None      ED ECG REPORT I, Wandra Arthurs, the attending physician,  personally viewed and interpreted this ECG.   Date: 02/01/2016  EKG Time: 9:42 am   Rate: 69  Rhythm: normal EKG, normal sinus rhythm  Axis: normal  Intervals:none  ST&T Change: nonspecific     Radiology Dg Chest 2 View  Result Date: 02/01/2016 CLINICAL DATA:  Pt been wheezing since last night, former smoker. Hx of diabetes x 50 years, HTN, right lumpectomy. No previous cold. EXAM: CHEST  2 VIEW COMPARISON:  03/02/2014 FINDINGS: Moderate thoracic spondylosis. Midline trachea. Normal heart size. Atherosclerosis in the transverse aorta. No pleural effusion or pneumothorax. Clear lungs. IMPRESSION: No acute cardiopulmonary disease. Aortic atherosclerosis. Electronically Signed   By: Abigail Miyamoto M.D.   On: 02/01/2016 10:06    Procedures Procedures (including critical care time)  Medications Ordered in ED Medications  albuterol (PROVENTIL) (2.5 MG/3ML) 0.083% nebulizer solution 5 mg (5 mg Nebulization Given 02/01/16 0945)  aspirin chewable tablet 324 mg (324 mg Oral Given 02/01/16 1315)     Initial Impression / Assessment and Plan / ED Course  I have reviewed the triage vital signs and the nursing notes.  Pertinent labs & imaging results that were available during my care of the patient were reviewed by me and considered in my medical decision making (see chart for details).  Clinical Course    [name] [name] is a 80 y.o. female here with SOB, wheezing at night. No wheezing currently. Consider ACS vs COPD (previous smoker) vs reflux. Has previous hx of breast cancer so if workup neg, consider CT angio to r/o PE.   1:20 pm Trop 0.03. No STEMI on EKG. Will get CT angio chest to look for PE vs pericardial effusion. Will give ASA.   2:30 pm CT angio unremarkable. Consulted Dr. Hubbard Robinson, who recommend delta trop and he will see patient as follow up. No wheezing after 1 neb.   3:20 PM Delta trop stable. Will dc home with ASA 81 mg daily, nexium. Will have her see cardiology  outpatient.    Final Clinical Impressions(s) / ED Diagnoses   Final diagnoses:  None    New Prescriptions New Prescriptions   No medications on file     Drenda Freeze, MD 02/01/16 1521

## 2017-06-09 DIAGNOSIS — I739 Peripheral vascular disease, unspecified: Secondary | ICD-10-CM | POA: Diagnosis present

## 2017-09-08 ENCOUNTER — Other Ambulatory Visit (INDEPENDENT_AMBULATORY_CARE_PROVIDER_SITE_OTHER): Payer: Self-pay | Admitting: Vascular Surgery

## 2017-09-08 DIAGNOSIS — R209 Unspecified disturbances of skin sensation: Secondary | ICD-10-CM

## 2017-09-11 ENCOUNTER — Ambulatory Visit (INDEPENDENT_AMBULATORY_CARE_PROVIDER_SITE_OTHER): Payer: Medicare Other

## 2017-09-11 ENCOUNTER — Encounter (INDEPENDENT_AMBULATORY_CARE_PROVIDER_SITE_OTHER): Payer: Self-pay | Admitting: Vascular Surgery

## 2017-09-11 ENCOUNTER — Ambulatory Visit (INDEPENDENT_AMBULATORY_CARE_PROVIDER_SITE_OTHER): Payer: Medicare Other | Admitting: Vascular Surgery

## 2017-09-11 VITALS — BP 182/77 | HR 76 | Resp 14 | Ht 64.5 in | Wt 200.0 lb

## 2017-09-11 DIAGNOSIS — R209 Unspecified disturbances of skin sensation: Secondary | ICD-10-CM | POA: Diagnosis not present

## 2017-09-11 DIAGNOSIS — R600 Localized edema: Secondary | ICD-10-CM

## 2017-09-11 DIAGNOSIS — M79604 Pain in right leg: Secondary | ICD-10-CM | POA: Diagnosis not present

## 2017-09-11 DIAGNOSIS — E118 Type 2 diabetes mellitus with unspecified complications: Secondary | ICD-10-CM

## 2017-09-11 DIAGNOSIS — E785 Hyperlipidemia, unspecified: Secondary | ICD-10-CM | POA: Diagnosis not present

## 2017-09-11 DIAGNOSIS — M79605 Pain in left leg: Secondary | ICD-10-CM

## 2017-09-12 ENCOUNTER — Encounter (INDEPENDENT_AMBULATORY_CARE_PROVIDER_SITE_OTHER): Payer: Self-pay | Admitting: Vascular Surgery

## 2017-09-12 DIAGNOSIS — E119 Type 2 diabetes mellitus without complications: Secondary | ICD-10-CM | POA: Insufficient documentation

## 2017-09-12 DIAGNOSIS — E785 Hyperlipidemia, unspecified: Secondary | ICD-10-CM | POA: Insufficient documentation

## 2017-09-12 NOTE — Progress Notes (Signed)
Subjective:    Patient ID: [name] [name], female    DOB: 1935-08-21, 82 y.o.   MRN: [id number] Chief Complaint  Patient presents with  . New Patient (Initial Visit)    ABI and Swollen ankles   Presents as a new patient referred by Dr. Candiss Norse for evaluation of peripheral artery disease.  The patient resents today without complaint.  She denies any claudication-like symptoms, rest pain or ulceration to the bilateral lower extremity the patient's only complaint today is some intermittent swelling to the bilateral legs.  The patient underwent a bilateral ABI which is notable for right triphasic tibials with normal great toe waveforms.  Left biphasic tibials with normal great toe waveforms.  Right ABI 1.00 / Left ABI 1.02.  She denies any fever, nausea vomiting.  She denies any recent surgery or trauma to the bilateral lower extremity.  She denies any DVT history.  Review of Systems  Constitutional: Negative.   HENT: Negative.   Eyes: Negative.   Respiratory: Negative.   Cardiovascular: Positive for leg swelling.  Gastrointestinal: Negative.   Endocrine: Negative.   Genitourinary: Negative.   Musculoskeletal: Negative.   Skin: Negative.   Allergic/Immunologic: Negative.   Neurological: Negative.   Hematological: Negative.   Psychiatric/Behavioral: Negative.       Objective:   Physical Exam  Constitutional: She is oriented to person, place, and time. She appears well-developed and well-nourished. No distress.  HENT:  Head: Normocephalic and atraumatic.  Right Ear: External ear normal.  Left Ear: External ear normal.  Eyes: Pupils are equal, round, and reactive to light. Conjunctivae and EOM are normal.  Neck: Normal range of motion.  Cardiovascular: Normal rate, regular rhythm, normal heart sounds and intact distal pulses.  Pulses:      Radial pulses are 2+ on the right side, and 2+ on the left side.  To palpate pedal pulses due to body habitus and edema however bilateral feet are  warm with good capillary refill.  Pulmonary/Chest: Effort normal and breath sounds normal.  Musculoskeletal: Normal range of motion. She exhibits edema (Mild to moderate nonpitting edema noted bilaterally).  Neurological: She is alert and oriented to person, place, and time.  Skin: Skin is warm and dry. She is not diaphoretic.  Psychiatric: She has a normal mood and affect. Her behavior is normal. Judgment and thought content normal.  Vitals reviewed.  BP (!) 182/77 (BP Location: Right Arm, Patient Position: Sitting)   Pulse 76   Resp 14   Ht 5' 4.5" (1.638 m)   Wt 200 lb (90.7 kg)   BMI 33.80 kg/m   Past Medical History:  Diagnosis Date  . Cancer [facility])    left breast cancer  . Diabetes mellitus without complication ([place])   . Hypertension    Social History   Socioeconomic History  . Marital status: Widowed    Spouse name: Not on file  . Number of children: Not on file  . Years of education: Not on file  . Highest education level: Not on file  Occupational History  . Not on file  Social Needs  . Financial resource strain: Not on file  . Food insecurity:    Worry: Not on file    Inability: Not on file  . Transportation needs:    Medical: Not on file    Non-medical: Not on file  Tobacco Use  . Smoking status: Former Research scientist (life sciences)  . Smokeless tobacco: Never Used  Substance and Sexual Activity  . Alcohol use: No  .  Drug use: No  . Sexual activity: Not on file  Lifestyle  . Physical activity:    Days per week: Not on file    Minutes per session: Not on file  . Stress: Not on file  Relationships  . Social connections:    Talks on phone: Not on file    Gets together: Not on file    Attends religious service: Not on file    Active member of club or organization: Not on file    Attends meetings of clubs or organizations: Not on file    Relationship status: Not on file  . Intimate partner violence:    Fear of current or ex partner: Not on file    Emotionally abused: Not  on file    Physically abused: Not on file    Forced sexual activity: Not on file  Other Topics Concern  . Not on file  Social History Narrative  . Not on file   Past Surgical History:  Procedure Laterality Date  . BREAST SURGERY     right lumpectomy  . KNEE SURGERY     bilateral   History reviewed. No pertinent family history.  Allergies  Allergen Reactions  . Codeine Other (See Comments)    "I go crazy"  . Etodolac     Other reaction(s): Unknown  . Indomethacin     Other reaction(s): Unknown  . Penicillins     Other reaction(s): Unknown  . Tizanidine     Other reaction(s): Syncope Fell on the floor   . Tramadol     Other reaction(s): Unknown      Assessment & Plan:  Presents as a new patient referred by Dr. Candiss Norse for evaluation of peripheral artery disease.  The patient resents today without complaint.  She denies any claudication-like symptoms, rest pain or ulceration to the bilateral lower extremity the patient's only complaint today is some intermittent swelling to the bilateral legs.  The patient underwent a bilateral ABI which is notable for right triphasic tibials with normal great toe waveforms.  Left biphasic tibials with normal great toe waveforms.  Right ABI 1.00 / Left ABI 1.02.  She denies any fever, nausea vomiting.  She denies any recent surgery or trauma to the bilateral lower extremity.  She denies any DVT history.  1. Lower extremity pain, bilateral - New Patient presents asymptomatically today denying claudication-like symptoms, rest pain or ulceration to the bilateral lower extremity To palpate pedal pulses on exam however ABIs are essentially normal Patient should follow-up on a yearly basis for peripheral artery disease surveillance I have discussed with the patient at length the risk factors for and pathogenesis of atherosclerotic disease and encouraged a healthy diet, regular exercise regimen and blood pressure / glucose control.  The patient was  encouraged to call the office in the interim if he experiences any claudication like symptoms, rest pain or ulcers to his feet / toes.  - VAS [place] ABI WITH/WO TBI; Future  2. Bilateral lower extremity edema - New The patient was encouraged to wear graduated compression stockings (20-30 mmHg) on a daily basis. The patient was instructed to begin wearing the stockings first thing in the morning and removing them in the evening. The patient was instructed specifically not to sleep in the stockings. Prescription given In addition, behavioral modification including elevation during the day will be initiated. Anti-inflammatories for pain. The patient should undergo a official venous work-up however she is not interested in moving forward with the venous ultrasound  at this time.  The patient changes her mind we will be happy to place her on the schedule The patient will follow up in one year to asses conservative management.  Information on compression stockings was given to the patient. The patient was instructed to call the office in the interim if any worsening edema or ulcerations to the legs, feet or toes occurs. The patient expresses their understanding.  3. Type 2 diabetes mellitus with complication, unspecified whether long term insulin use (HCC) - Stable Encouraged good control as its slows the progression of atherosclerotic disease  4. Hyperlipidemia, unspecified hyperlipidemia type - Stable Encouraged good control as its slows the progression of atherosclerotic disease  Current Outpatient Medications on File Prior to Visit  Medication Sig Dispense Refill  . acetaminophen (TYLENOL) 325 MG tablet Take 650 mg by mouth every 6 (six) hours as needed.    Marland Kitchen aspirin 81 MG chewable tablet Chew 81 mg by mouth daily.    . benazepril-hydrochlorthiazide (LOTENSIN HCT) 20-12.5 MG tablet Take 1 tablet by mouth daily.  11  . fluconazole (DIFLUCAN) 150 MG tablet Take by mouth.    Marland Kitchen glimepiride (AMARYL) 4  MG tablet Take 1 tablet by mouth 2 (two) times daily.  3  . linagliptin (TRADJENTA) 5 MG TABS tablet Take by mouth.    . memantine (NAMENDA) 10 MG tablet Take 2 tablets by mouth daily. At night  2  . metFORMIN (GLUCOPHAGE) 1000 MG tablet Take 1-1.5 tablets by mouth 2 (two) times daily. Take 1 & 1/2 tablet in the morning and 1 tablet in the evening  11  . pioglitazone (ACTOS) 45 MG tablet TAKE 1 TABLET (45 MG TOTAL) BY MOUTH ONCE DAILY.  2  . pravastatin (PRAVACHOL) 20 MG tablet Take 1 tablet by mouth daily.  5  . esomeprazole (NEXIUM) 40 MG capsule Take 1 capsule (40 mg total) by mouth daily. 30 capsule 1  . insulin NPH Human (HUMULIN N) 100 UNIT/ML injection Inject 20 units every morning and 10 units every evening    . meclizine (ANTIVERT) 12.5 MG tablet Take by mouth.    . oxybutynin (DITROPAN-XL) 10 MG 24 hr tablet Take by mouth.    . promethazine (PHENERGAN) 12.5 MG tablet TAKE 1 TABLET BY MOUTH EVERY 6 HOURS AS NEEDED FOR NAUSEA     No current facility-administered medications on file prior to visit.    There are no Patient Instructions on file for this visit. No follow-ups on file.  KIMBERLY A STEGMAYER, PA-C

## 2017-11-17 ENCOUNTER — Other Ambulatory Visit: Payer: Self-pay

## 2017-11-17 ENCOUNTER — Emergency Department
Admission: EM | Admit: 2017-11-17 | Discharge: 2017-11-17 | Disposition: A | Discharge status: Home / Self Care | Payer: Medicare Other | Attending: Emergency Medicine | Admitting: Emergency Medicine

## 2017-11-17 DIAGNOSIS — Z79899 Other long term (current) drug therapy: Secondary | ICD-10-CM | POA: Diagnosis not present

## 2017-11-17 DIAGNOSIS — Z7982 Long term (current) use of aspirin: Secondary | ICD-10-CM | POA: Insufficient documentation

## 2017-11-17 DIAGNOSIS — Z853 Personal history of malignant neoplasm of breast: Secondary | ICD-10-CM | POA: Insufficient documentation

## 2017-11-17 DIAGNOSIS — R2242 Localized swelling, mass and lump, left lower limb: Secondary | ICD-10-CM | POA: Diagnosis present

## 2017-11-17 DIAGNOSIS — E119 Type 2 diabetes mellitus without complications: Secondary | ICD-10-CM | POA: Diagnosis not present

## 2017-11-17 DIAGNOSIS — I10 Essential (primary) hypertension: Secondary | ICD-10-CM | POA: Insufficient documentation

## 2017-11-17 DIAGNOSIS — M109 Gout, unspecified: Secondary | ICD-10-CM | POA: Diagnosis not present

## 2017-11-17 DIAGNOSIS — Z794 Long term (current) use of insulin: Secondary | ICD-10-CM | POA: Insufficient documentation

## 2017-11-17 DIAGNOSIS — Z87891 Personal history of nicotine dependence: Secondary | ICD-10-CM | POA: Diagnosis not present

## 2017-11-17 LAB — CBC
HEMATOCRIT: 35.9 % (ref 35.0–47.0)
Hemoglobin: 12.2 g/dL (ref 12.0–16.0)
MCH: 32.1 pg (ref 26.0–34.0)
MCHC: 34.1 g/dL (ref 32.0–36.0)
MCV: 94.1 fL (ref 80.0–100.0)
Platelets: 241 10*3/uL (ref 150–440)
RBC: 3.81 MIL/uL (ref 3.80–5.20)
RDW: 14.6 % — AB (ref 11.5–14.5)
WBC: 6.4 10*3/uL (ref 3.6–11.0)

## 2017-11-17 LAB — BASIC METABOLIC PANEL
Anion gap: 9 (ref 5–15)
BUN: 14 mg/dL (ref 8–23)
CALCIUM: 9.4 mg/dL (ref 8.9–10.3)
CO2: 28 mmol/L (ref 22–32)
CREATININE: 0.88 mg/dL (ref 0.44–1.00)
Chloride: 107 mmol/L (ref 98–111)
GFR calc non Af Amer: >60 mL/min (ref >60)
GLUCOSE: 175 mg/dL — AB (ref 70–99)
Potassium: 4.3 mmol/L (ref 3.5–5.1)
Sodium: 144 mmol/L (ref 135–145)

## 2017-11-17 LAB — URIC ACID: Uric Acid, Serum: 7.6 mg/dL — ABNORMAL HIGH (ref 2.5–7.1)

## 2017-11-17 MED ORDER — COLCHICINE 0.6 MG PO TABS
0.6000 mg | ORAL_TABLET | Freq: Once | ORAL | Status: AC
  Administered 2017-11-17: 0.6 mg via ORAL
  Filled 2017-11-17: qty 1

## 2017-11-17 MED ORDER — COLCHICINE 0.6 MG PO TABS: 0.6000 mg | ORAL_TABLET | Freq: Two times a day (BID) | ORAL | 0 refills | Status: DC

## 2017-11-17 NOTE — ED Provider Notes (Addendum)
[facility] Emergency Department Provider Note  Time seen: 7:57 AM  I have reviewed the triage vital signs and the nursing notes.   HISTORY  Chief Complaint Leg Swelling    HPI [name] [name] is a 82 y.o. female with a past medical history of diabetes, hypertension, presents to the emergency department for pain in her feet especially her left foot.  According to the patient for the past several days she has felt some pain in both of her feet, but states over the past 24 hours she has had significant pain in the left foot especially in the great toe.  Patient states redness to this area as well.  She also would like a lump evaluated on her right forearm, which she states she just noticed several weeks ago but is not sure how long it is been there.  Denies any fever.  No chest pain or trouble breathing.    Past Medical History:  Diagnosis Date  . Cancer [facility])    left breast cancer  . Diabetes mellitus without complication ([place])   . Hypertension     Patient Active Problem List   Diagnosis Date Noted  . Diabetes ([place]) 09/12/2017  . Hyperlipidemia 09/12/2017    Past Surgical History:  Procedure Laterality Date  . BREAST SURGERY     right lumpectomy  . KNEE SURGERY     bilateral    Prior to Admission medications   Medication Sig Start Date End Date Taking? Authorizing Provider  acetaminophen (TYLENOL) 325 MG tablet Take 650 mg by mouth every 6 (six) hours as needed.    [provider]  aspirin 81 MG chewable tablet Chew 81 mg by mouth daily.    [provider]  benazepril-hydrochlorthiazide (LOTENSIN HCT) 20-12.5 MG tablet Take 1 tablet by mouth daily. 01/11/16   [provider]  esomeprazole (NEXIUM) 40 MG capsule Take 1 capsule (40 mg total) by mouth daily. 02/01/16 01/31/17  Drenda Freeze, MD  fluconazole (DIFLUCAN) 150 MG tablet Take by mouth. 07/05/16   [provider]  glimepiride (AMARYL) 4 MG tablet Take 1  tablet by mouth 2 (two) times daily. 12/01/15   [provider]  insulin NPH Human (HUMULIN N) 100 UNIT/ML injection Inject 20 units every morning and 10 units every evening 09/02/16   [provider]  linagliptin (TRADJENTA) 5 MG TABS tablet Take by mouth. 05/08/17   [provider]  meclizine (ANTIVERT) 12.5 MG tablet Take by mouth.    [provider]  memantine (NAMENDA) 10 MG tablet Take 2 tablets by mouth daily. At night 10/26/15   [provider]  metFORMIN (GLUCOPHAGE) 1000 MG tablet Take 1-1.5 tablets by mouth 2 (two) times daily. Take 1 & 1/2 tablet in the morning and 1 tablet in the evening 01/11/16   [provider]  oxybutynin (DITROPAN-XL) 10 MG 24 hr tablet Take by mouth. 08/25/17 08/25/18  [provider]  pioglitazone (ACTOS) 45 MG tablet TAKE 1 TABLET (45 MG TOTAL) BY MOUTH ONCE DAILY. 08/29/17   [provider]  pravastatin (PRAVACHOL) 20 MG tablet Take 1 tablet by mouth daily. 01/02/16   [provider]  promethazine (PHENERGAN) 12.5 MG tablet TAKE 1 TABLET BY MOUTH EVERY 6 HOURS AS NEEDED FOR NAUSEA 09/03/14   [provider]    Allergies  Allergen Reactions  . Codeine Other (See Comments)    "I go crazy"  . Etodolac     Other reaction(s): Unknown  . Indomethacin  Other reaction(s): Unknown  . Penicillins     Other reaction(s): Unknown  . Tizanidine     Other reaction(s): Syncope Fell on the floor   . Tramadol     Other reaction(s): Unknown    No family history on file.  Social History Social History   Tobacco Use  . Smoking status: Former Research scientist (life sciences)  . Smokeless tobacco: Never Used  Substance Use Topics  . Alcohol use: No  . Drug use: No    Review of Systems Constitutional: Negative for fever. Cardiovascular: Negative for chest pain. Respiratory: Negative for shortness of breath. Gastrointestinal: Negative for abdominal pain Genitourinary: Negative for urinary  compaints Musculoskeletal: Left foot pain and redness Skin: Redness to base of left great toe Neurological: Negative for headache All other ROS negative  ____________________________________________   PHYSICAL EXAM:  VITAL SIGNS: ED Triage Vitals [11/17/17 0731]  Enc Vitals Group     BP (!) 142/76     Pulse Rate 81     Resp 17     Temp 97.8 F (36.6 C)     Temp Source Oral     SpO2 100 %     Weight 200 lb (90.7 kg)     Height 5\' 4"  (1.626 m)     Head Circumference      Peak Flow      Pain Score 10     Pain Loc      Pain Edu?      Excl. in [place]?    Constitutional: Alert and oriented. Well appearing and in no distress. Eyes: Normal exam ENT   Head: Normocephalic and atraumatic.   Mouth/Throat: Mucous membranes are moist. Cardiovascular: Normal rate, regular rhythm.  Respiratory: Normal respiratory effort without tachypnea nor retractions. Breath sounds are clear Gastrointestinal: Soft and nontender. No distention.  Musculoskeletal: Good range of motion bilateral lower extremities, no peripheral edema on my examination, 2+ DP pulses.  Patient does have tenderness and mild erythema around the base of the left great toe.  On her right upper extremity she does have a small lump on the forearm which is most consistent with a lipoma.  Patient was told she had lipomas/fatty tumors on her thigh as well several years ago. Neurologic:  Normal speech and language. No gross focal neurologic deficits  Skin:  Skin is warm, dry  Psychiatric: Mood and affect are normal.  ____________________________________________   INITIAL IMPRESSION / ASSESSMENT AND PLAN / ED COURSE  Pertinent labs & imaging results that were available during my care of the patient were reviewed by me and considered in my medical decision making (see chart for details).  Patient presents to the emergency department for pain in her feet especially left foot.  Her examination is concerning for possible gout,  denies any history of gout in the past.  Patient has tenderness with mild erythema around the base of the left great toe consistent with podagra.  We will check basic labs including a uric acid level and continue to closely monitor.  Anticipate likely discharge home with gout treatment.  Patient's uric acid level is slightly elevated along with exam consistent with podagra, highly suspect gout.  Patient has normal kidney function.  Will place on indomethacin and have the patient follow-up with her primary care doctor.  Patient agreeable to this plan of care.  Patient has allergy to indomethacin.  Will place on colchicine.  ____________________________________________   FINAL CLINICAL IMPRESSION(S) / ED DIAGNOSES  Gout    Harvest Dark, MD 11/17/17  [id number]    Harvest Dark, MD 11/17/17 [phone]

## 2017-11-17 NOTE — ED Triage Notes (Signed)
Pt c/o swelling in the lower extremities and having pain, states she is having "knots on my arms". States "it might be a clot of something".. Pt is in NAD, denies any SOB.Marland Kitchen

## 2017-12-17 ENCOUNTER — Encounter: Payer: Self-pay | Admitting: Emergency Medicine

## 2017-12-17 ENCOUNTER — Emergency Department
Admission: EM | Admit: 2017-12-17 | Discharge: 2017-12-17 | Disposition: A | Discharge status: Home / Self Care | Payer: Medicare Other | Attending: Emergency Medicine | Admitting: Emergency Medicine

## 2017-12-17 ENCOUNTER — Emergency Department: Payer: Medicare Other

## 2017-12-17 DIAGNOSIS — Z853 Personal history of malignant neoplasm of breast: Secondary | ICD-10-CM | POA: Diagnosis not present

## 2017-12-17 DIAGNOSIS — Z7982 Long term (current) use of aspirin: Secondary | ICD-10-CM | POA: Diagnosis not present

## 2017-12-17 DIAGNOSIS — Z7984 Long term (current) use of oral hypoglycemic drugs: Secondary | ICD-10-CM | POA: Diagnosis not present

## 2017-12-17 DIAGNOSIS — M79672 Pain in left foot: Secondary | ICD-10-CM | POA: Diagnosis present

## 2017-12-17 DIAGNOSIS — I10 Essential (primary) hypertension: Secondary | ICD-10-CM | POA: Diagnosis not present

## 2017-12-17 DIAGNOSIS — Z79899 Other long term (current) drug therapy: Secondary | ICD-10-CM | POA: Insufficient documentation

## 2017-12-17 DIAGNOSIS — M1000 Idiopathic gout, unspecified site: Secondary | ICD-10-CM

## 2017-12-17 DIAGNOSIS — E119 Type 2 diabetes mellitus without complications: Secondary | ICD-10-CM | POA: Insufficient documentation

## 2017-12-17 LAB — BASIC METABOLIC PANEL
Anion gap: 9 (ref 5–15)
BUN: 13 mg/dL (ref 8–23)
CO2: 26 mmol/L (ref 22–32)
Calcium: 9.1 mg/dL (ref 8.9–10.3)
Chloride: 105 mmol/L (ref 98–111)
Creatinine, Ser: 0.99 mg/dL (ref 0.44–1.00)
GFR calc Af Amer: >60 mL/min (ref >60)
GFR, EST NON AFRICAN AMERICAN: 52 mL/min — AB (ref >60)
Glucose, Bld: 132 mg/dL — ABNORMAL HIGH (ref 70–99)
Potassium: 4.3 mmol/L (ref 3.5–5.1)
Sodium: 140 mmol/L (ref 135–145)

## 2017-12-17 LAB — CBC WITH DIFFERENTIAL/PLATELET
Basophils Absolute: 0.1 10*3/uL (ref 0–0.1)
Basophils Relative: 1 %
EOS PCT: 1 %
Eosinophils Absolute: 0.1 10*3/uL (ref 0–0.7)
HEMATOCRIT: 35.8 % (ref 35.0–47.0)
Hemoglobin: 12.3 g/dL (ref 12.0–16.0)
LYMPHS ABS: 1 10*3/uL (ref 1.0–3.6)
LYMPHS PCT: 16 %
MCH: 32.5 pg (ref 26.0–34.0)
MCHC: 34.3 g/dL (ref 32.0–36.0)
MCV: 94.7 fL (ref 80.0–100.0)
MONO ABS: 0.4 10*3/uL (ref 0.2–0.9)
MONOS PCT: 7 %
NEUTROS ABS: 4.6 10*3/uL (ref 1.4–6.5)
Neutrophils Relative %: 75 %
PLATELETS: 231 10*3/uL (ref 150–440)
RBC: 3.78 MIL/uL — ABNORMAL LOW (ref 3.80–5.20)
RDW: 14.3 % (ref 11.5–14.5)
WBC: 6.2 10*3/uL (ref 3.6–11.0)

## 2017-12-17 LAB — URIC ACID: Uric Acid, Serum: 7.6 mg/dL — ABNORMAL HIGH (ref 2.5–7.1)

## 2017-12-17 MED ORDER — COLCHICINE 0.6 MG PO TABS: 0.6000 mg | ORAL_TABLET | Freq: Two times a day (BID) | ORAL | 0 refills | Status: DC

## 2017-12-17 MED ORDER — ALLOPURINOL 300 MG PO TABS: 300.0000 mg | ORAL_TABLET | Freq: Every day | ORAL | 3 refills | Status: DC

## 2017-12-17 NOTE — ED Triage Notes (Signed)
Patient states her left foot is swollen and "darker than right foot.  Patient states she was recently treated for gout in both feet and had improvement of that issue.  Patient states, "I want my right foot checked out too, because it could start hurting."  Patient ambulatory to triage with a cane.

## 2017-12-17 NOTE — Discharge Instructions (Addendum)
Try and follow a low purine diet.  Discontinue the hot dogs in the mornings.  Trying to eat either eggs or oatmeal.  Follow up with the podiatrist.  His phone numbers been provided for you.  Follow-up with your regular doctor for your already scheduled appointment in October.  Please discuss the new medications with her.  See if they would like for you to stay on this medication.  Return to the emergency department if you are worsening.

## 2017-12-17 NOTE — ED Provider Notes (Signed)
[facility] Emergency Department Provider Note  ____________________________________________   First MD Initiated Contact with Patient 12/17/17 [phone]     (approximate)  I have reviewed the triage vital signs and the nursing notes.   HISTORY  Chief Complaint Foot Pain    HPI [name] [name] is a 82 y.o. female presents emergency department complaining of left foot pain.  She states she had recently been diagnosed with gout and took her colchicine but that the gout went away.  She states that now the foot is still swollen and tender and the skin is discolored across the top of her foot.  She states she wants an x-ray of this foot.  She also wants me to check the other foot just in case something was going to happen to that one.  She is a history of diabetes.  She does not take insulin as her doctor took her off of it because she was tired of sticking herself.  She denies any fever or chills.  She denies any injury.    Past Medical History:  Diagnosis Date  . Cancer [facility])    left breast cancer  . Diabetes mellitus without complication ([place])   . Hypertension     Patient Active Problem List   Diagnosis Date Noted  . Diabetes ([place]) 09/12/2017  . Hyperlipidemia 09/12/2017    Past Surgical History:  Procedure Laterality Date  . BREAST SURGERY     right lumpectomy  . KNEE SURGERY     bilateral    Prior to Admission medications   Medication Sig Start Date End Date Taking? Authorizing Provider  acetaminophen (TYLENOL) 325 MG tablet Take 650 mg by mouth every 6 (six) hours as needed for mild pain.     [provider]  allopurinol (ZYLOPRIM) 300 MG tablet Take 1 tablet (300 mg total) by mouth daily. 12/17/17   Caryn Section Linden Dolin, PA-C  aspirin 81 MG tablet Take 81 mg by mouth daily.     [provider]  benazepril-hydrochlorthiazide (LOTENSIN HCT) 20-12.5 MG tablet Take 1 tablet by mouth daily. 01/11/16   [provider]  colchicine 0.6  MG tablet Take 1 tablet (0.6 mg total) by mouth 2 (two) times daily for 7 days. 12/17/17 12/24/17  Trinitee Horgan, Linden Dolin, PA-C  esomeprazole (NEXIUM) 40 MG capsule Take 1 capsule (40 mg total) by mouth daily. Patient taking differently: Take 40 mg by mouth daily as needed (Heartburn).  02/01/16 11/18/18  Drenda Freeze, MD  linagliptin (TRADJENTA) 5 MG TABS tablet Take 5 mg by mouth daily.  05/08/17   [provider]  meclizine (ANTIVERT) 12.5 MG tablet Take 12.5 mg by mouth daily as needed for dizziness.     [provider]  memantine (NAMENDA) 10 MG tablet Take 2 tablets by mouth at bedtime.  10/26/15   [provider]  metFORMIN (GLUCOPHAGE) 1000 MG tablet Take 1-1.5 tablets by mouth 2 (two) times daily. Take 1 & 1/2 tablet in the morning and 1 tablet in the evening 01/11/16   [provider]  pioglitazone (ACTOS) 45 MG tablet Take 45 mg by mouth daily.  08/29/17   [provider]  pravastatin (PRAVACHOL) 20 MG tablet Take 1 tablet by mouth every evening.  01/02/16   [provider]  promethazine (PHENERGAN) 12.5 MG tablet TAKE 1 TABLET BY MOUTH EVERY 6 HOURS AS NEEDED FOR NAUSEA 09/03/14   [provider]    Allergies Codeine; Etodolac; Indomethacin; Penicillins; Tizanidine; and Tramadol  No family history on file.  Social History Social History   Tobacco Use  . Smoking status: Former Research scientist (life sciences)  . Smokeless tobacco: Never Used  Substance Use Topics  . Alcohol use: No  . Drug use: No    Review of Systems  Constitutional: No fever/chills Eyes: No visual changes. ENT: No sore throat. Respiratory: Denies cough Genitourinary: Negative for dysuria. Musculoskeletal: Negative for back pain.  Positive for left foot pain Skin: Negative for rash.    ____________________________________________   PHYSICAL EXAM:  VITAL SIGNS: ED Triage Vitals  Enc Vitals Group     BP 12/17/17 0818 (!) 151/77     Pulse Rate 12/17/17 0818 67      Resp 12/17/17 0818 20     Temp 12/17/17 0818 98.2 F (36.8 C)     Temp Source 12/17/17 0818 Oral     SpO2 12/17/17 0818 100 %     Weight 12/17/17 0819 212 lb (96.2 kg)     Height 12/17/17 0819 _0  (1.626 m)     Head Circumference --      Peak Flow --      Pain Score 12/17/17 0818 10     Pain Loc --      Pain Edu? --      Excl. in [place]? --     Constitutional: Alert and oriented. Well appearing and in no acute distress. Eyes: Conjunctivae are normal.  Head: Atraumatic. Nose: No congestion/rhinnorhea. Mouth/Throat: Mucous membranes are moist.   Neck:  supple no lymphadenopathy noted Cardiovascular: Normal rate, regular rhythm. Heart sounds are normal Respiratory: Normal respiratory effort.  No retractions, lungs c t a  GU: deferred Musculoskeletal: FROM all extremities, warm and well perfused.  The left great toe is swollen and tender at the joint.  There is slight redness noted.  The area feels warm to touch.  The metatarsals are not tender.  The ankle has a mild amount of swelling noted but has no bony tenderness.  The right foot is negative for bony tenderness or swelling. Neurologic:  Normal speech and language.  Skin:  Skin is warm, dry and intact. No rash noted. Psychiatric: Mood and affect are normal. Speech and behavior are normal.  ____________________________________________   LABS (all labs ordered are listed, but only abnormal results are displayed)  Labs Reviewed  BASIC METABOLIC PANEL - Abnormal; Notable for the following components:      Result Value   Glucose, Bld 132 (*)    GFR calc non Af Amer 52 (*)    All other components within normal limits  URIC ACID - Abnormal; Notable for the following components:   Uric Acid, Serum 7.6 (*)    All other components within normal limits  CBC WITH DIFFERENTIAL/PLATELET - Abnormal; Notable for the following components:   RBC 3.78 (*)    All other components within normal limits    ____________________________________________   ____________________________________________  RADIOLOGY  X-ray of the left foot shows some erosion which is related to the gout  ____________________________________________   PROCEDURES  Procedure(s) performed: No  Procedures    ____________________________________________   INITIAL IMPRESSION / ASSESSMENT AND PLAN / ED COURSE  Pertinent labs & imaging results that were available during my care of the patient were reviewed by me and considered in my medical decision making (see chart for details).   Patient is a 82 year old female presents emergency department complaining of left foot pain.  She denies injury.  She had a recent episode of gout  but she feels like this is not gout.  On physical exam patient's left foot is red and swollen at the right great toe and the skin is discolored across the distal aspect.  The area is warm to touch.  The ankle has minimal swelling noted.  X-ray of the left foot Uric acid and met B ordered  ----------------------------------------- 10:28 AM on 12/17/2017 -----------------------------------------  X-ray left foot show some erosion from gout.  The CBC has a normal WBC.  Uric acid is elevated at 7.6.  Met b with elevated glucose at 132 and decreased gfr at 52.  Explained all of these findings to the patient.  She is to follow-up with podiatry.  She states that she has been seen at [facility] clinic orthopedics before.  Therefore she should see podiatry at [facility].  She is also to follow-up with her regular doctor and discuss the addition of allopurinol along with the colchicine.  Had lengthy discussion with her about her diet.  She is to stop eating [place] nurse in the mornings.  Explained to her that this food is actually causing part of the gout problem.  She states she understands and will try to change her habits.  She was discharged in stable condition.  She was given  prescriptions for allopurinol and colchicine     As part of my medical decision making, I reviewed the following data within the [place] notes reviewed and incorporated, Labs reviewed uric acid elevated at 7.6, glucose elevated at 132, GFR decreased at 52, CBC with normal WBCs, Old chart reviewed, Radiograph reviewed x-ray of the left foot show some erosion near the left great toe due to gout, Notes from prior ED visits and [place] Controlled Substance Database  ____________________________________________   FINAL CLINICAL IMPRESSION(S) / ED DIAGNOSES  Final diagnoses:  Idiopathic gout, unspecified chronicity, unspecified site      NEW MEDICATIONS STARTED DURING THIS VISIT:  New Prescriptions   ALLOPURINOL (ZYLOPRIM) 300 MG TABLET    Take 1 tablet (300 mg total) by mouth daily.     Note:  This document was prepared using Dragon voice recognition software and may include unintentional dictation errors.    Versie Starks, PA-C 12/17/17 1032    Arta Silence, MD 12/17/17 [phone]

## 2017-12-17 NOTE — ED Notes (Addendum)
See triage note  Presents with left foot pain  States her feet have been bothering her for about 1 month   Was seen last month and dx'd with gout  Was placed on chol chine and pain cleared up  Then states pain returned  Left great toe swollen and painful   States her left foot is darker than the right  Ambulates with a cane to treatment room

## 2018-02-03 ENCOUNTER — Other Ambulatory Visit: Payer: Self-pay

## 2018-02-03 ENCOUNTER — Observation Stay
Admission: EM | Admit: 2018-02-03 | Discharge: 2018-02-04 | Disposition: A | Discharge status: Home / Self Care | Payer: Medicare Other | Attending: Family Medicine | Admitting: Family Medicine

## 2018-02-03 ENCOUNTER — Encounter: Payer: Self-pay | Admitting: Emergency Medicine

## 2018-02-03 DIAGNOSIS — Z853 Personal history of malignant neoplasm of breast: Secondary | ICD-10-CM | POA: Insufficient documentation

## 2018-02-03 DIAGNOSIS — R7989 Other specified abnormal findings of blood chemistry: Secondary | ICD-10-CM | POA: Diagnosis present

## 2018-02-03 DIAGNOSIS — E785 Hyperlipidemia, unspecified: Secondary | ICD-10-CM | POA: Insufficient documentation

## 2018-02-03 DIAGNOSIS — Z7984 Long term (current) use of oral hypoglycemic drugs: Secondary | ICD-10-CM | POA: Diagnosis not present

## 2018-02-03 DIAGNOSIS — Z7982 Long term (current) use of aspirin: Secondary | ICD-10-CM | POA: Insufficient documentation

## 2018-02-03 DIAGNOSIS — Z885 Allergy status to narcotic agent status: Secondary | ICD-10-CM | POA: Diagnosis not present

## 2018-02-03 DIAGNOSIS — R1110 Vomiting, unspecified: Secondary | ICD-10-CM

## 2018-02-03 DIAGNOSIS — E119 Type 2 diabetes mellitus without complications: Secondary | ICD-10-CM | POA: Diagnosis not present

## 2018-02-03 DIAGNOSIS — Z880 Allergy status to penicillin: Secondary | ICD-10-CM | POA: Diagnosis not present

## 2018-02-03 DIAGNOSIS — Z888 Allergy status to other drugs, medicaments and biological substances status: Secondary | ICD-10-CM | POA: Diagnosis not present

## 2018-02-03 DIAGNOSIS — K5900 Constipation, unspecified: Secondary | ICD-10-CM | POA: Insufficient documentation

## 2018-02-03 DIAGNOSIS — R748 Abnormal levels of other serum enzymes: Principal | ICD-10-CM | POA: Insufficient documentation

## 2018-02-03 DIAGNOSIS — I10 Essential (primary) hypertension: Secondary | ICD-10-CM | POA: Insufficient documentation

## 2018-02-03 DIAGNOSIS — R778 Other specified abnormalities of plasma proteins: Secondary | ICD-10-CM | POA: Diagnosis present

## 2018-02-03 DIAGNOSIS — M109 Gout, unspecified: Secondary | ICD-10-CM | POA: Diagnosis not present

## 2018-02-03 DIAGNOSIS — Z79899 Other long term (current) drug therapy: Secondary | ICD-10-CM | POA: Diagnosis not present

## 2018-02-03 DIAGNOSIS — R112 Nausea with vomiting, unspecified: Secondary | ICD-10-CM | POA: Insufficient documentation

## 2018-02-03 DIAGNOSIS — Z87891 Personal history of nicotine dependence: Secondary | ICD-10-CM | POA: Insufficient documentation

## 2018-02-03 LAB — GLUCOSE, CAPILLARY: Glucose-Capillary: 125 mg/dL — ABNORMAL HIGH (ref 70–99)

## 2018-02-03 LAB — CBC WITH DIFFERENTIAL/PLATELET
ABS IMMATURE GRANULOCYTES: 0.03 10*3/uL (ref 0.00–0.07)
Basophils Absolute: 0 10*3/uL (ref 0.0–0.1)
Basophils Relative: 0 %
Eosinophils Absolute: 0 10*3/uL (ref 0.0–0.5)
Eosinophils Relative: 1 %
HEMATOCRIT: 38.3 % (ref 36.0–46.0)
HEMOGLOBIN: 12.4 g/dL (ref 12.0–15.0)
IMMATURE GRANULOCYTES: 1 %
LYMPHS PCT: 14 %
Lymphs Abs: 1 10*3/uL (ref 0.7–4.0)
MCH: 31 pg (ref 26.0–34.0)
MCHC: 32.4 g/dL (ref 30.0–36.0)
MCV: 95.8 fL (ref 80.0–100.0)
Monocytes Absolute: 0.3 10*3/uL (ref 0.1–1.0)
Monocytes Relative: 5 %
NEUTROS ABS: 5.2 10*3/uL (ref 1.7–7.7)
Neutrophils Relative %: 79 %
PLATELETS: 262 10*3/uL (ref 150–400)
RBC: 4 MIL/uL (ref 3.87–5.11)
RDW: 13.7 % (ref 11.5–15.5)
WBC: 6.6 10*3/uL (ref 4.0–10.5)
nRBC: 0 % (ref 0.0–0.2)

## 2018-02-03 LAB — COMPREHENSIVE METABOLIC PANEL
ALT: 13 U/L (ref 0–44)
ANION GAP: 11 (ref 5–15)
AST: 20 U/L (ref 15–41)
Albumin: 3.9 g/dL (ref 3.5–5.0)
Alkaline Phosphatase: 50 U/L (ref 38–126)
BUN: 11 mg/dL (ref 8–23)
CHLORIDE: 104 mmol/L (ref 98–111)
CO2: 25 mmol/L (ref 22–32)
Calcium: 9.4 mg/dL (ref 8.9–10.3)
Creatinine, Ser: 0.99 mg/dL (ref 0.44–1.00)
GFR, EST NON AFRICAN AMERICAN: 52 mL/min — AB (ref >60)
Glucose, Bld: 159 mg/dL — ABNORMAL HIGH (ref 70–99)
POTASSIUM: 4.4 mmol/L (ref 3.5–5.1)
Sodium: 140 mmol/L (ref 135–145)
Total Bilirubin: 0.6 mg/dL (ref 0.3–1.2)
Total Protein: 7.3 g/dL (ref 6.5–8.1)

## 2018-02-03 LAB — TROPONIN I
TROPONIN I: 0.06 ng/mL — AB (ref <0.03)
TROPONIN I: 0.06 ng/mL — AB (ref <0.03)
Troponin I: 0.05 ng/mL (ref <0.03)

## 2018-02-03 LAB — LIPASE, BLOOD: Lipase: 27 U/L (ref 11–51)

## 2018-02-03 MED ORDER — ONDANSETRON HCL 4 MG/2ML IJ SOLN: 4.0000 mg | Freq: Four times a day (QID) | INTRAMUSCULAR | Status: DC | PRN

## 2018-02-03 MED ORDER — ALLOPURINOL 300 MG PO TABS
300.0000 mg | ORAL_TABLET | Freq: Every day | ORAL | Status: DC
  Administered 2018-02-04: 300 mg via ORAL
  Filled 2018-02-03: qty 1

## 2018-02-03 MED ORDER — PRAVASTATIN SODIUM 20 MG PO TABS
20.0000 mg | ORAL_TABLET | Freq: Every evening | ORAL | Status: DC
  Administered 2018-02-03: 20 mg via ORAL
  Filled 2018-02-03: qty 1

## 2018-02-03 MED ORDER — HYDROCHLOROTHIAZIDE 12.5 MG PO CAPS
12.5000 mg | ORAL_CAPSULE | Freq: Every day | ORAL | Status: DC
  Filled 2018-02-03: qty 1

## 2018-02-03 MED ORDER — INSULIN ASPART 100 UNIT/ML ~~LOC~~ SOLN: 0.0000 [IU] | Freq: Every day | SUBCUTANEOUS | Status: DC

## 2018-02-03 MED ORDER — ACETAMINOPHEN 650 MG RE SUPP: 650.0000 mg | Freq: Four times a day (QID) | RECTAL | Status: DC | PRN

## 2018-02-03 MED ORDER — ONDANSETRON HCL 4 MG PO TABS
4.0000 mg | ORAL_TABLET | Freq: Four times a day (QID) | ORAL | Status: DC | PRN
  Administered 2018-02-04: 4 mg via ORAL
  Filled 2018-02-03: qty 1

## 2018-02-03 MED ORDER — BENAZEPRIL-HYDROCHLOROTHIAZIDE 20-12.5 MG PO TABS: 1.0000 | ORAL_TABLET | Freq: Every day | ORAL | Status: DC

## 2018-02-03 MED ORDER — SENNOSIDES-DOCUSATE SODIUM 8.6-50 MG PO TABS: 1.0000 | ORAL_TABLET | Freq: Every evening | ORAL | Status: DC | PRN

## 2018-02-03 MED ORDER — BENAZEPRIL HCL 20 MG PO TABS
20.0000 mg | ORAL_TABLET | Freq: Every day | ORAL | Status: DC
  Administered 2018-02-04: 20 mg via ORAL
  Filled 2018-02-03: qty 1

## 2018-02-03 MED ORDER — ENOXAPARIN SODIUM 40 MG/0.4ML ~~LOC~~ SOLN
40.0000 mg | SUBCUTANEOUS | Status: DC
  Filled 2018-02-03: qty 0.4

## 2018-02-03 MED ORDER — COLCHICINE 0.6 MG PO TABS
0.6000 mg | ORAL_TABLET | Freq: Two times a day (BID) | ORAL | Status: DC
  Administered 2018-02-03 – 2018-02-04 (×2): 0.6 mg via ORAL
  Filled 2018-02-03 (×2): qty 1

## 2018-02-03 MED ORDER — SALINE SPRAY 0.65 % NA SOLN
1.0000 | NASAL | Status: DC | PRN
  Filled 2018-02-03: qty 44

## 2018-02-03 MED ORDER — MEMANTINE HCL 10 MG PO TABS
20.0000 mg | ORAL_TABLET | Freq: Every evening | ORAL | Status: DC
  Filled 2018-02-03 (×2): qty 2

## 2018-02-03 MED ORDER — INSULIN ASPART 100 UNIT/ML ~~LOC~~ SOLN
0.0000 [IU] | Freq: Three times a day (TID) | SUBCUTANEOUS | Status: DC
  Administered 2018-02-04: 1 [IU] via SUBCUTANEOUS
  Filled 2018-02-03: qty 1

## 2018-02-03 MED ORDER — ASPIRIN EC 81 MG PO TBEC
81.0000 mg | DELAYED_RELEASE_TABLET | Freq: Every day | ORAL | Status: DC
  Administered 2018-02-03 – 2018-02-04 (×2): 81 mg via ORAL
  Filled 2018-02-03 (×2): qty 1

## 2018-02-03 MED ORDER — SODIUM CHLORIDE 0.9 % IV BOLUS
500.0000 mL | Freq: Once | INTRAVENOUS | Status: AC
  Administered 2018-02-03: 500 mL via INTRAVENOUS

## 2018-02-03 MED ORDER — ONDANSETRON HCL 4 MG/2ML IJ SOLN
4.0000 mg | Freq: Once | INTRAMUSCULAR | Status: AC
  Administered 2018-02-03: 4 mg via INTRAVENOUS
  Filled 2018-02-03: qty 2

## 2018-02-03 MED ORDER — ACETAMINOPHEN 325 MG PO TABS
650.0000 mg | ORAL_TABLET | Freq: Four times a day (QID) | ORAL | Status: DC | PRN
  Administered 2018-02-03 – 2018-02-04 (×2): 650 mg via ORAL
  Filled 2018-02-03 (×3): qty 2

## 2018-02-03 NOTE — ED Notes (Signed)
Pt now wants to stay - states she feels she will die if she leaves. Pt states she can't walk around, reminded her she has been up multiple times to the bathroom walking without assistance. Pt then states she feels like she has a temp. Temp is normal. Pt given sandwich tray. Advised her we will have to re-establish an iv for admission. Pt is a hard stick.

## 2018-02-03 NOTE — ED Triage Notes (Signed)
Pt presents via [facility] from [facility] with c/o emesis. Pt states she woke up around 8am and began throwing up. Pt states she threw up all her morning medications. Pt c/o abdominal pain as well. Ems reported that pt threw up one time for them and it was clear. VSS and BS 156. Hx of diabetes, hypertension, and stroke. Pt has bag of personal medications at bedside.

## 2018-02-03 NOTE — ED Notes (Signed)
Family asking if pt can take off her ekg lead stickers. Pt has been up in chair and wants to leave. Advised that would be fine.

## 2018-02-03 NOTE — H&P (Signed)
[name] [name] at [facility] NAME: [name] [name]    MR#:  [id number]  DATE OF BIRTH:  04-06-1936  DATE OF ADMISSION:  02/03/2018  PRIMARY CARE PHYSICIAN: Glendon Axe, MD   REQUESTING/REFERRING PHYSICIAN: Charlotte Crumb, MD  CHIEF COMPLAINT:   Chief Complaint  Patient presents with  . Emesis    HISTORY OF PRESENT ILLNESS:  [name] [name]  is a 82 y.o. female with a known history of breast cancer, HTN, T2DM who presented to the ED with nausea and vomiting that started this morning.  She states she has had about 5-6 episodes of vomiting up "clear liquid".  She is also noted some lightheadedness with walking.  She denies any abdominal pain.  She endorses some constipation and states that she had a "small, hard" bowel movement this morning.  She had a strain with that bowel movement.  She denies any chest pain, shortness of breath, or diaphoresis.  In the ED, labs were unremarkable except for a mildly elevated troponin to 0.05 and 0.06.  The ED physician discussed with Dr. Humphrey Rolls, who stated that he would see her in clinic on Monday at 9:00 AM.  Patient was initially agreeable to the plan, but then stated that she did not want to leave the hospital because she was worried that she was going to die.  She stated that if she were discharged from the ED, she would just call 911 and come straight back.  Hospitalists were called for admission.  PAST MEDICAL HISTORY:   Past Medical History:  Diagnosis Date  . Cancer [facility])    left breast cancer  . Diabetes mellitus without complication ([place])   . Hypertension     PAST SURGICAL HISTORY:   Past Surgical History:  Procedure Laterality Date  . BREAST SURGERY     right lumpectomy  . KNEE SURGERY     bilateral    SOCIAL HISTORY:   Social History   Tobacco Use  . Smoking status: Former Research scientist (life sciences)  . Smokeless tobacco: Never Used  Substance Use Topics  . Alcohol use: No    FAMILY HISTORY:  History  reviewed. No pertinent family history.  DRUG ALLERGIES:   Allergies  Allergen Reactions  . Codeine Other (See Comments)    "I go crazy"  . Etodolac     Other reaction(s): Unknown  . Indomethacin     Other reaction(s): Unknown  . Penicillins Other (See Comments)    Does not remember this allergy Has patient had a PCN reaction causing immediate rash, facial/tongue/throat swelling, SOB or lightheadedness with hypotension: Unknown Has patient had a PCN reaction causing severe rash involving mucus membranes or skin necrosis: Unknown Has patient had a PCN reaction that required hospitalization: Unknown Has patient had a PCN reaction occurring within the last 10 years: Unknown If all of the above answers are "NO", then may proceed with Cephalosporin use.   . Tizanidine     Other reaction(s): Syncope Fell on the floor   . Tramadol     Other reaction(s): Unknown    REVIEW OF SYSTEMS:   Review of Systems  Constitutional: Negative for chills and fever.  HENT: Negative for congestion and sore throat.   Eyes: Negative for blurred vision and double vision.  Respiratory: Negative for cough and shortness of breath.   Cardiovascular: Negative for chest pain, palpitations and leg swelling.  Gastrointestinal: Positive for constipation, nausea and vomiting. Negative for abdominal pain and diarrhea.  Genitourinary: Negative for  dysuria and frequency.  Musculoskeletal: Negative for back pain and neck pain.  Neurological: Negative for dizziness and headaches.  Psychiatric/Behavioral: Negative for depression. The patient is not nervous/anxious.     MEDICATIONS AT HOME:   Prior to Admission medications   Medication Sig Start Date End Date Taking? Authorizing Provider  acetaminophen (TYLENOL) 325 MG tablet Take 650 mg by mouth every 6 (six) hours as needed for mild pain.    Yes [provider]  allopurinol (ZYLOPRIM) 300 MG tablet Take 1 tablet (300 mg total) by mouth daily. 12/17/17  Yes  Fisher, Linden Dolin, PA-C  aspirin 81 MG tablet Take 81 mg by mouth daily.    Yes [provider]  benazepril-hydrochlorthiazide (LOTENSIN HCT) 20-12.5 MG tablet Take 1 tablet by mouth daily. 01/11/16  Yes [provider]  colchicine 0.6 MG tablet Take 1 tablet (0.6 mg total) by mouth 2 (two) times daily for 7 days. 12/17/17 02/04/19 Yes Fisher, Linden Dolin, PA-C  ibuprofen (ADVIL,MOTRIN) 200 MG tablet Take 200 mg by mouth every 6 (six) hours as needed for moderate pain.   Yes [provider]  linagliptin (TRADJENTA) 5 MG TABS tablet Take 5 mg by mouth daily as needed (high blood sugar).  05/08/17  Yes [provider]  loperamide (LOPERAMIDE A-D) 2 MG tablet Take 2 mg by mouth as needed for diarrhea or loose stools.   Yes [provider]  meclizine (ANTIVERT) 12.5 MG tablet Take 12.5 mg by mouth daily as needed for dizziness.    Yes [provider]  memantine (NAMENDA) 10 MG tablet Take 2 tablets by mouth every evening.  10/26/15  Yes [provider]  metFORMIN (GLUCOPHAGE) 1000 MG tablet Take 1-1.5 tablets by mouth 2 (two) times daily. Take 1 & 1/2 tablet in the morning and 1 tablet in the evening 01/11/16  Yes [provider]  pioglitazone (ACTOS) 45 MG tablet Take 45 mg by mouth daily.  08/29/17  Yes [provider]  pravastatin (PRAVACHOL) 20 MG tablet Take 1 tablet by mouth every evening.  01/02/16  Yes [provider]  promethazine (PHENERGAN) 12.5 MG tablet TAKE 1 TABLET BY MOUTH EVERY 6 HOURS AS NEEDED FOR NAUSEA 09/03/14  Yes [provider]  sodium chloride (OCEAN) 0.65 % SOLN nasal spray Place 1 spray into both nostrils as needed for congestion.   Yes [provider]  esomeprazole (NEXIUM) 40 MG capsule Take 1 capsule (40 mg total) by mouth daily. Patient not taking: Reported on 02/03/2018 02/01/16 11/18/18  Drenda Freeze, MD      VITAL SIGNS:  Blood pressure (!) 120/46, pulse 70, temperature  (!) 97.5 F (36.4 C), temperature source Oral, resp. rate 16, height 5\' 4"  (1.626 m), weight 96.2 kg, SpO2 100 %.  PHYSICAL EXAMINATION:  Physical Exam  GENERAL:  82 y.o.-year-old patient lying in the bed with no acute distress.  EYES: Pupils equal, round, reactive to light and accommodation. No scleral icterus. Extraocular muscles intact.  HEENT: Head atraumatic, normocephalic. Oropharynx and nasopharynx clear. Moist mucous membranes. NECK:  Supple, no jugular venous distention. No thyroid enlargement, no tenderness.  LUNGS: Normal breath sounds bilaterally, no wheezing, rales,rhonchi or crepitation. No use of accessory muscles of respiration.  CARDIOVASCULAR: RRR, S1, S2 normal. No murmurs, rubs, or gallops.  ABDOMEN: Soft, nontender, nondistended. Bowel sounds present. No organomegaly or mass.  EXTREMITIES: No pedal edema, cyanosis, or clubbing.  NEUROLOGIC: Cranial nerves II through XII are intact. Muscle strength 5/5 in all extremities. Sensation  intact. Gait not checked.  PSYCHIATRIC: The patient is alert and oriented x 3.  SKIN: No obvious rash, lesion, or ulcer.   LABORATORY PANEL:   CBC Recent Labs  Lab 02/03/18 1125  WBC 6.6  HGB 12.4  HCT 38.3  PLT 262   ------------------------------------------------------------------------------------------------------------------  Chemistries  Recent Labs  Lab 02/03/18 1125  NA 140  K 4.4  CL 104  CO2 25  GLUCOSE 159*  BUN 11  CREATININE 0.99  CALCIUM 9.4  AST 20  ALT 13  ALKPHOS 50  BILITOT 0.6   ------------------------------------------------------------------------------------------------------------------  Cardiac Enzymes Recent Labs  Lab 02/03/18 1335  TROPONINI 0.06*   ------------------------------------------------------------------------------------------------------------------  RADIOLOGY:  No results found.    IMPRESSION AND PLAN:   Elevated troponin- troponin mildly elevated to 0.05 and  0.06. No chest pain or shortness of breath. - EDP discussed with Dr. Humphrey Rolls and patient has an appointment to see him in clinic on Monday at 9:00AM. - trend troponins - if troponin remains flat, can be discharged in the morning with outpatient cardiology follow-up - continue aspirin   Nausea/vomiting- may be secondary to viral gastroenteritis vs constipation. Abdominal exam is benign and patient denies abdominal pain. - check abdominal x-ray - zofran prn  Hypertension- normotensive in the ED - continue benazepril-HCTZ  Type 2 diabetes- on oral diabetes meds at home - sensitive SSI  Hyperlipidemia- stable - continue pravastatin  Gout- states she currently has a gout flare in her foot - continue allopurinol and colchicine   All the records are reviewed and case discussed with ED provider. Management plans discussed with the patient, family and they are in agreement.  CODE STATUS: Full  TOTAL TIME TAKING CARE OF THIS PATIENT: 45 minutes.    Berna Spare Sylvana Bonk M.D on 02/03/2018 at 6:15 PM  Between 7am to 6pm - Pager - [phone]  After 6pm go to [email or web address] - Proofreader  [facility]  Office  [phone]  CC: Primary care physician; Glendon Axe, MD   Note: This dictation was prepared with Dragon dictation along with smaller phrase technology. Any transcriptional errors that result from this process are unintentional.

## 2018-02-03 NOTE — Discharge Instructions (Signed)
Your cardiac enzymes were very slightly elevated.  You would very much prefer not to stay in the hospital which is your choice of course.  We have discussed what this implies.  If you have chest pain or shortness of breath or feel worse in any way return to the emergency room.  This would include vomiting, or abdominal pain.  We have arranged an outpatient cardiology appointment with you first thing in the morning on Monday at 9:00 with Dr. Humphrey Rolls.  Please follow-up with him without fail.

## 2018-02-03 NOTE — ED Notes (Signed)
Pt stating to daughter at bedside "Sit over there and watch me die, it's my time, the Reita Cliche got tired of helping me".

## 2018-02-03 NOTE — ED Provider Notes (Addendum)
[facility] Emergency Department Provider Note  ____________________________________________   I have reviewed the triage vital signs and the nursing notes. Where available I have reviewed prior notes and, if possible and indicated, outside hospital notes.    HISTORY  Chief Complaint Emesis    HPI [name] [name] is a 82 y.o. female  With a history of breast cancer remotely diabetes mellitus hypertension, and gout who took colchicine last night for the first time in a long time, states that she woke up today and felt nauseated.  Had a small bowel movement this morning which was normal but small, has had no abdominal pain or fever.  She states she was watching Daisy Floro on TV and became nauseated.She had one episode of clear vomit en route and feels much better at this time.  No other new medications.  No chest pain no shortness of breath no abdominal pain, she did tell EMS she had abdominal pain but she told me she did not. She denies any chest pain or shortness of breath.   Past Medical History:  Diagnosis Date  . Cancer [facility])    left breast cancer  . Diabetes mellitus without complication ([place])   . Hypertension     Patient Active Problem List   Diagnosis Date Noted  . Diabetes ([place]) 09/12/2017  . Hyperlipidemia 09/12/2017    Past Surgical History:  Procedure Laterality Date  . BREAST SURGERY     right lumpectomy  . KNEE SURGERY     bilateral    Prior to Admission medications   Medication Sig Start Date End Date Taking? Authorizing Provider  acetaminophen (TYLENOL) 325 MG tablet Take 650 mg by mouth every 6 (six) hours as needed for mild pain.     [provider]  allopurinol (ZYLOPRIM) 300 MG tablet Take 1 tablet (300 mg total) by mouth daily. 12/17/17   Caryn Section Linden Dolin, PA-C  aspirin 81 MG tablet Take 81 mg by mouth daily.     [provider]  benazepril-hydrochlorthiazide (LOTENSIN HCT) 20-12.5 MG tablet Take 1 tablet by  mouth daily. 01/11/16   [provider]  colchicine 0.6 MG tablet Take 1 tablet (0.6 mg total) by mouth 2 (two) times daily for 7 days. 12/17/17 12/24/17  Fisher, Linden Dolin, PA-C  esomeprazole (NEXIUM) 40 MG capsule Take 1 capsule (40 mg total) by mouth daily. Patient taking differently: Take 40 mg by mouth daily as needed (Heartburn).  02/01/16 11/18/18  Drenda Freeze, MD  linagliptin (TRADJENTA) 5 MG TABS tablet Take 5 mg by mouth daily.  05/08/17   [provider]  meclizine (ANTIVERT) 12.5 MG tablet Take 12.5 mg by mouth daily as needed for dizziness.     [provider]  memantine (NAMENDA) 10 MG tablet Take 2 tablets by mouth at bedtime.  10/26/15   [provider]  metFORMIN (GLUCOPHAGE) 1000 MG tablet Take 1-1.5 tablets by mouth 2 (two) times daily. Take 1 & 1/2 tablet in the morning and 1 tablet in the evening 01/11/16   [provider]  pioglitazone (ACTOS) 45 MG tablet Take 45 mg by mouth daily.  08/29/17   [provider]  pravastatin (PRAVACHOL) 20 MG tablet Take 1 tablet by mouth every evening.  01/02/16   [provider]  promethazine (PHENERGAN) 12.5 MG tablet TAKE 1 TABLET BY MOUTH EVERY 6 HOURS AS NEEDED FOR NAUSEA 09/03/14   [provider]    Allergies Codeine; Etodolac; Indomethacin; Penicillins; Tizanidine; and Tramadol  History reviewed. No pertinent family history.  Social History Social History   Tobacco Use  . Smoking status: Former Research scientist (life sciences)  . Smokeless tobacco: Never Used  Substance Use Topics  . Alcohol use: No  . Drug use: No    Review of Systems Constitutional: No fever/chills Eyes: No visual changes. ENT: No sore throat. No stiff neck no neck pain Cardiovascular: Denies chest pain. Respiratory: Denies shortness of breath. Gastrointestinal:   +  vomiting.  No diarrhea.  No constipation. Genitourinary: Negative for dysuria. Musculoskeletal: Negative lower extremity swelling Skin: Negative  for rash. Neurological: Negative for severe headaches, focal weakness or numbness.   ____________________________________________   PHYSICAL EXAM:  VITAL SIGNS: ED Triage Vitals  Enc Vitals Group     BP 02/03/18 1035 (!) 157/67     Pulse Rate 02/03/18 1036 67     Resp 02/03/18 1035 (!) 23     Temp 02/03/18 1038 (!) 97.5 F (36.4 C)     Temp Source 02/03/18 1038 Oral     SpO2 02/03/18 1036 100 %     Weight 02/03/18 1038 212 lb 1.3 oz (96.2 kg)     Height 02/03/18 1038 5\' 4"  (1.626 m)     Head Circumference --      Peak Flow --      Pain Score 02/03/18 1038 0     Pain Loc --      Pain Edu? --      Excl. in [place]? --     Constitutional: Alert and oriented. Well appearing and in no acute distress. Eyes: Conjunctivae are normal Head: Atraumatic HEENT: No congestion/rhinnorhea. Mucous membranes are moist.  Oropharynx non-erythematous Neck:   Nontender with no meningismus, no masses, no stridor Cardiovascular: Normal rate, regular rhythm. Grossly normal heart sounds.  Good peripheral circulation. Respiratory: Normal respiratory effort.  No retractions. Lungs CTAB. Abdominal: Soft and nontender. No distention. No guarding no rebound Back:  There is no focal tenderness or step off.  there is no midline tenderness there are no lesions noted. there is no CVA tenderness Musculoskeletal: No lower extremity tenderness, no upper extremity tenderness. No joint effusions, no DVT signs strong distal pulses no edema Neurologic:  Normal speech and language. No gross focal neurologic deficits are appreciated.  Skin:  Skin is warm, dry and intact. No rash noted. Psychiatric: Mood and affect are normal. Speech and behavior are normal.  ____________________________________________   LABS (all labs ordered are listed, but only abnormal results are displayed)  Labs Reviewed  COMPREHENSIVE METABOLIC PANEL  CBC WITH DIFFERENTIAL/PLATELET  LIPASE, BLOOD  TROPONIN I    Pertinent labs  results  that were available during my care of the patient were reviewed by me and considered in my medical decision making (see chart for details). ____________________________________________  EKG  I personally interpreted any EKGs ordered by me or triage Sinus rhythm rate 69 bpm no acute ST elevation or depression, normal axis unremarkable EKG ____________________________________________  RADIOLOGY  Pertinent labs & imaging results that were available during my care of the patient were reviewed by me and considered in my medical decision making (see chart for details). If possible, patient and/or family made aware of any abnormal findings.  No results found. ____________________________________________    PROCEDURES  Procedure(s) performed: None  Procedures  Critical Care performed: None  ____________________________________________   INITIAL IMPRESSION / ASSESSMENT AND PLAN / ED COURSE  Pertinent labs & imaging results that were available during my care of the patient were reviewed by  me and considered in my medical decision making (see chart for details).  Patient here with isolated vomiting and a benign abdomen, blood pressure is reassuring vital signs are reassuring, given her age we will check some blood work to make sure everything else is looking okay.  Low suspicion of this being a cardiac event, will send a troponin as a precaution I do not think serial enzymes will be indicated since this is been going on since she woke up.  We will give her antiemetics IV fluid, this could be a side effect of her colchicine as well.  If so we expect it to wear off as it appears to be doing.  Patient does attribute  her symptoms to [employer].  I did express sympathy.  ----------------------------------------- 12:53 PM on 02/03/2018 -----------------------------------------  Patient is much more relaxed at this time she has no nausea no vomiting in the department her abdomen is benign on  serial exams, troponin is borderline positive but has been so before we will recheck to make sure it is not trending significantly upwards, she has no complaints of chest pain or shortness of breath and her EKG is reassuring.  Patient would like to go home at this time but we have prevailed upon her to try to stay long enough for [place] to recheck troponin.  ----------------------------------------- 3:26 PM on 02/03/2018 -----------------------------------------  She has pulled out her IV she has no chest pain no shortness of breath and she is demanding discharge family are with her at bedside they agree with this.  Troponins are borderline, but not starkly elevated, and she does have a history of borderline troponins in the past.  EKG is reassuring and again she is asymptomatic.  I have discussed with patient and the family the risk benefits and alternatives of discharge and they do understand all of this I feel that collectively they have the capacity to understand this and they adamantly wish to go home which of course is their choice.  Nonetheless, I have prevailed upon him to stay here long enough for me to talk to Dr. Humphrey Rolls, who states that he is happy to see her at 9:00 in the morning on Monday and agrees with the plan.  Given patient's symptoms and findings we will discharge her.  They do not wish to stay for repeat enzymes at this time.  They are eager to go home.  At no time did she have any chest pain or shortness of breath and her EKG is reassuring.  Extensive return precautions given and understood.  They understand if she does feel worse in any way she is to return  ----------------------------------------- 3:38 PM on 02/03/2018 -----------------------------------------  After arranging outpatient follow-up I was called back into the room patient states that she has changed her mind and is very much desirous of being admitted at this time.  She states that if she goes home she will just call 911  and come back because she is worried that she will be nauseated again and she lives by herself and she is worried about her cardiac enzymes.  None of this is unreasonable.  We will admit her to the hospital for further observation given marginal troponins and a nauseated 82 year old.    ____________________________________________   FINAL CLINICAL IMPRESSION(S) / ED DIAGNOSES  Final diagnoses:  None      This chart was dictated using voice recognition software.  Despite best efforts to proofread,  errors can occur which can change meaning.  Schuyler Amor, MD 02/03/18 1207    Schuyler Amor, MD 02/03/18 1253    Schuyler Amor, MD 02/03/18 1528    Schuyler Amor, MD 02/03/18 1539

## 2018-02-03 NOTE — Progress Notes (Signed)
Family Meeting Note  Advance Directive:no  Today a meeting took place with the Patient.  Patient is able to participate.  The following clinical team members were present during this meeting:MD  The following were discussed:Patient's diagnosis: , Patient's progosis: Unable to determine and Goals for treatment: Full Code   Discussed CODE STATUS and the importance of an advanced directive with patient today.  She states that she overall feels pretty healthy and would like to be a full code.  She notes that she does not have any serious medical conditions.  She would like to live many more years.  Discussed the importance of having an advanced directive.  Patient declines chaplain consult this admission.  Additional follow-up to be provided: prn  Time spent during discussion:20 minutes  Evette Doffing, MD

## 2018-02-03 NOTE — ED Notes (Addendum)
Pt pulled out her iv - pressure applied - bruising noted. Bleeding controlled.

## 2018-02-03 NOTE — ED Notes (Signed)
Report called - pt can go to 2a

## 2018-02-04 ENCOUNTER — Observation Stay: Payer: Medicare Other

## 2018-02-04 DIAGNOSIS — R748 Abnormal levels of other serum enzymes: Secondary | ICD-10-CM | POA: Diagnosis not present

## 2018-02-04 LAB — CBC
HCT: 36.3 % (ref 36.0–46.0)
Hemoglobin: 11.9 g/dL — ABNORMAL LOW (ref 12.0–15.0)
MCH: 31.1 pg (ref 26.0–34.0)
MCHC: 32.8 g/dL (ref 30.0–36.0)
MCV: 94.8 fL (ref 80.0–100.0)
Platelets: 247 K/uL (ref 150–400)
RBC: 3.83 MIL/uL — ABNORMAL LOW (ref 3.87–5.11)
RDW: 13.9 % (ref 11.5–15.5)
WBC: 6 K/uL (ref 4.0–10.5)
nRBC: 0 % (ref 0.0–0.2)

## 2018-02-04 LAB — BASIC METABOLIC PANEL WITH GFR
Anion gap: 6 (ref 5–15)
BUN: 10 mg/dL (ref 8–23)
CO2: 28 mmol/L (ref 22–32)
Calcium: 9.1 mg/dL (ref 8.9–10.3)
Chloride: 105 mmol/L (ref 98–111)
Creatinine, Ser: 0.95 mg/dL (ref 0.44–1.00)
GFR calc Af Amer: >60 mL/min
GFR calc non Af Amer: 55 mL/min — ABNORMAL LOW
Glucose, Bld: 131 mg/dL — ABNORMAL HIGH (ref 70–99)
Potassium: 3.7 mmol/L (ref 3.5–5.1)
Sodium: 139 mmol/L (ref 135–145)

## 2018-02-04 LAB — GLUCOSE, CAPILLARY: Glucose-Capillary: 139 mg/dL — ABNORMAL HIGH (ref 70–99)

## 2018-02-04 LAB — TROPONIN I: Troponin I: 0.07 ng/mL

## 2018-02-04 MED ORDER — POLYETHYLENE GLYCOL 3350 17 G PO PACK: 17.0000 g | PACK | Freq: Every day | ORAL | 0 refills | Status: DC | PRN

## 2018-02-04 MED ORDER — DSS 100 MG PO CAPS: 200.0000 mg | ORAL_CAPSULE | Freq: Every day | ORAL | 0 refills | Status: AC

## 2018-02-04 MED ORDER — POLYETHYLENE GLYCOL 3350 17 G PO PACK
17.0000 g | PACK | Freq: Every day | ORAL | Status: DC
  Administered 2018-02-04: 17 g via ORAL
  Filled 2018-02-04: qty 1

## 2018-02-04 MED ORDER — FLEET ENEMA 7-19 GM/118ML RE ENEM
1.0000 | ENEMA | Freq: Once | RECTAL | Status: AC
  Administered 2018-02-04: 1 via RECTAL

## 2018-02-04 NOTE — Discharge Summary (Signed)
[facility] [place] NAME: [name] [name]    MR#:  [id number]  DATE OF BIRTH:  03-07-1936  DATE OF ADMISSION:  02/03/2018 ADMITTING PHYSICIAN: Sela Hua, MD  DATE OF DISCHARGE: No discharge date for patient encounter.  PRIMARY CARE PHYSICIAN: Glendon Axe, MD    ADMISSION DIAGNOSIS:  Non-intractable vomiting with nausea, unspecified vomiting type [R11.2] Elevated troponin [R79.89]  DISCHARGE DIAGNOSIS:  Active Problems:   Elevated troponin   SECONDARY DIAGNOSIS:   Past Medical History:  Diagnosis Date  . Cancer [facility])    left breast cancer  . Diabetes mellitus without complication ([place])   . Hypertension     HOSPITAL COURSE:  *Acute elevated troponin Cardiac enzymes negative for acute coronary syndrome EDP discussed with Dr. Humphrey Rolls and patient has an appointment to see him in clinic on Monday at 9:00AM given insignificance of minimally elevated troponins  *Acute nausea/vomiting Secondary to constipation Resolved Provided antiemetics PRN, started on MiraLAX as needed, Colace daily  *Hypertension Stable on benazepril-HCTZ  *Chronic type 2 diabetes Stable on current regiment  DISCHARGE CONDITIONS:   stable  CONSULTS OBTAINED:    DRUG ALLERGIES:   Allergies  Allergen Reactions  . Codeine Other (See Comments)    "I go crazy"  . Etodolac     Other reaction(s): Unknown  . Indomethacin     Other reaction(s): Unknown  . Penicillins Other (See Comments)    Does not remember this allergy Has patient had a PCN reaction causing immediate rash, facial/tongue/throat swelling, SOB or lightheadedness with hypotension: Unknown Has patient had a PCN reaction causing severe rash involving mucus membranes or skin necrosis: Unknown Has patient had a PCN reaction that required hospitalization: Unknown Has patient had a PCN reaction occurring within the last 10 years: Unknown If all of the above answers are "NO",  then may proceed with Cephalosporin use.   . Tizanidine     Other reaction(s): Syncope Fell on the floor   . Tramadol     Other reaction(s): Unknown    DISCHARGE MEDICATIONS:   Allergies as of 02/04/2018      Reactions   Codeine Other (See Comments)   "I go crazy"   Etodolac    Other reaction(s): Unknown   Indomethacin    Other reaction(s): Unknown   Penicillins Other (See Comments)   Does not remember this allergy Has patient had a PCN reaction causing immediate rash, facial/tongue/throat swelling, SOB or lightheadedness with hypotension: Unknown Has patient had a PCN reaction causing severe rash involving mucus membranes or skin necrosis: Unknown Has patient had a PCN reaction that required hospitalization: Unknown Has patient had a PCN reaction occurring within the last 10 years: Unknown If all of the above answers are "NO", then may proceed with Cephalosporin use.   Tizanidine    Other reaction(s): Syncope Fell on the floor    Tramadol    Other reaction(s): Unknown      Medication List    TAKE these medications   acetaminophen 325 MG tablet Commonly known as:  TYLENOL Take 650 mg by mouth every 6 (six) hours as needed for mild pain.   allopurinol 300 MG tablet Commonly known as:  ZYLOPRIM Take 1 tablet (300 mg total) by mouth daily.   aspirin 81 MG tablet Take 81 mg by mouth daily.   benazepril-hydrochlorthiazide 20-12.5 MG tablet Commonly known as:  LOTENSIN HCT Take 1 tablet by mouth daily.   colchicine 0.6 MG tablet Take 1  tablet (0.6 mg total) by mouth 2 (two) times daily for 7 days.   DSS 100 MG Caps Take 200 mg by mouth at bedtime.   esomeprazole 40 MG capsule Commonly known as:  NEXIUM Take 1 capsule (40 mg total) by mouth daily.   ibuprofen 200 MG tablet Commonly known as:  ADVIL,MOTRIN Take 200 mg by mouth every 6 (six) hours as needed for moderate pain.   LOPERAMIDE A-D 2 MG tablet Generic drug:  loperamide Take 2 mg by mouth as needed  for diarrhea or loose stools.   meclizine 12.5 MG tablet Commonly known as:  ANTIVERT Take 12.5 mg by mouth daily as needed for dizziness.   memantine 10 MG tablet Commonly known as:  NAMENDA Take 2 tablets by mouth every evening.   metFORMIN 1000 MG tablet Commonly known as:  GLUCOPHAGE Take 1-1.5 tablets by mouth 2 (two) times daily. Take 1 & 1/2 tablet in the morning and 1 tablet in the evening   pioglitazone 45 MG tablet Commonly known as:  ACTOS Take 45 mg by mouth daily.   polyethylene glycol packet Commonly known as:  MIRALAX / GLYCOLAX Take 17 g by mouth daily as needed (Constipation).   pravastatin 20 MG tablet Commonly known as:  PRAVACHOL Take 1 tablet by mouth every evening.   promethazine 12.5 MG tablet Commonly known as:  PHENERGAN TAKE 1 TABLET BY MOUTH EVERY 6 HOURS AS NEEDED FOR NAUSEA   sodium chloride 0.65 % Soln nasal spray Commonly known as:  OCEAN Place 1 spray into both nostrils as needed for congestion.   TRADJENTA 5 MG Tabs tablet Generic drug:  linagliptin Take 5 mg by mouth daily as needed (high blood sugar).        DISCHARGE INSTRUCTIONS:   If you experience worsening of your admission symptoms, develop shortness of breath, life threatening emergency, suicidal or homicidal thoughts you must seek medical attention immediately by calling 911 or calling your MD immediately  if symptoms less severe.  You Must read complete instructions/literature along with all the possible adverse reactions/side effects for all the Medicines you take and that have been prescribed to you. Take any new Medicines after you have completely understood and accept all the possible adverse reactions/side effects.   Please note  You were cared for by a hospitalist during your hospital stay. If you have any questions about your discharge medications or the care you received while you were in the hospital after you are discharged, you can call the unit and asked to  speak with the hospitalist on call if the hospitalist that took care of you is not available. Once you are discharged, your primary care physician will handle any further medical issues. Please note that NO REFILLS for any discharge medications will be authorized once you are discharged, as it is imperative that you return to your primary care physician (or establish a relationship with a primary care physician if you do not have one) for your aftercare needs so that they can reassess your need for medications and monitor your lab values.    Today   CHIEF COMPLAINT:   Chief Complaint  Patient presents with  . Emesis    HISTORY OF PRESENT ILLNESS:  82 y.o. female with a known history of breast cancer, HTN, T2DM who presented to the ED with nausea and vomiting that started this morning.  She states she has had about 5-6 episodes of vomiting up "clear liquid".  She is also noted some lightheadedness  with walking.  She denies any abdominal pain.  She endorses some constipation and states that she had a "small, hard" bowel movement this morning.  She had a strain with that bowel movement.  She denies any chest pain, shortness of breath, or diaphoresis.  In the ED, labs were unremarkable except for a mildly elevated troponin to 0.05 and 0.06.  The ED physician discussed with Dr. Humphrey Rolls, who stated that he would see her in clinic on Monday at 9:00 AM.  Patient was initially agreeable to the plan, but then stated that she did not want to leave the hospital because she was worried that she was going to die.  She stated that if she were discharged from the ED, she would just call 911 and come straight back.  Hospitalists were called for admission. VITAL SIGNS:  Blood pressure (!) 142/62, pulse 65, temperature 98 F (36.7 C), resp. rate 14, height 5\' 4"  (1.626 m), weight 86.7 kg, SpO2 96 %.  I/O:    Intake/Output Summary (Last 24 hours) at 02/04/2018 1034 Last data filed at 02/04/2018 1033 Gross per 24  hour  Intake 240 ml  Output 500 ml  Net -260 ml    PHYSICAL EXAMINATION:  GENERAL:  82 y.o.-year-old patient lying in the bed with no acute distress.  EYES: Pupils equal, round, reactive to light and accommodation. No scleral icterus. Extraocular muscles intact.  HEENT: Head atraumatic, normocephalic. Oropharynx and nasopharynx clear.  NECK:  Supple, no jugular venous distention. No thyroid enlargement, no tenderness.  LUNGS: Normal breath sounds bilaterally, no wheezing, rales,rhonchi or crepitation. No use of accessory muscles of respiration.  CARDIOVASCULAR: S1, S2 normal. No murmurs, rubs, or gallops.  ABDOMEN: Soft, non-tender, non-distended. Bowel sounds present. No organomegaly or mass.  EXTREMITIES: No pedal edema, cyanosis, or clubbing.  NEUROLOGIC: Cranial nerves II through XII are intact. Muscle strength 5/5 in all extremities. Sensation intact. Gait not checked.  PSYCHIATRIC: The patient is alert and oriented x 3.  SKIN: No obvious rash, lesion, or ulcer.   DATA REVIEW:   CBC Recent Labs  Lab 02/04/18 0455  WBC 6.0  HGB 11.9*  HCT 36.3  PLT 247    Chemistries  Recent Labs  Lab 02/03/18 1125 02/04/18 0455  NA 140 139  K 4.4 3.7  CL 104 105  CO2 25 28  GLUCOSE 159* 131*  BUN 11 10  CREATININE 0.99 0.95  CALCIUM 9.4 9.1  AST 20  --   ALT 13  --   ALKPHOS 50  --   BILITOT 0.6  --     Cardiac Enzymes Recent Labs  Lab 02/04/18 0455  TROPONINI 0.07*    Microbiology Results  No results found for this or any previous visit.  RADIOLOGY:  Dg Abd 1 View  Result Date: 02/04/2018 CLINICAL DATA:  Vomiting and constipation EXAM: ABDOMEN - 1 VIEW COMPARISON:  CT abdomen and pelvis December 01, 2010 FINDINGS: There is moderate stool in the colon. There is no bowel dilatation or air-fluid level to suggest bowel obstruction. No free air. There are multiple calcifications in the uterus consistent with leiomyomatous change. Lung bases are clear. IMPRESSION:  Calcified leiomyomas in the pelvis. Moderate stool in colon. No evident bowel obstruction or free air. Electronically Signed   By: Lowella Grip III M.D.   On: 02/04/2018 01:38    EKG:   Orders placed or performed during the hospital encounter of 02/03/18  . EKG 12-Lead  . EKG 12-Lead  . EKG 12-Lead  .  EKG 12-Lead  . ED EKG  . ED EKG      Management plans discussed with the patient, family and they are in agreement.  CODE STATUS:     Code Status Orders  (From admission, onward)         Start     Ordered   02/03/18 2107  Full code  Continuous     02/03/18 2106        Code Status History    This patient has a current code status but no historical code status.      TOTAL TIME TAKING CARE OF THIS PATIENT: 40 minutes.    Avel Peace Salary M.D on 02/04/2018 at 10:34 AM  Between 7am to 6pm - Pager - [phone]  After 6pm go to [email or web address] - password [email or web address] [facility]  Office  [phone]  CC: Primary care physician; Glendon Axe, MD   Note: This dictation was prepared with Dragon dictation along with smaller phrase technology. Any transcriptional errors that result from this process are unintentional.

## 2018-02-04 NOTE — Plan of Care (Signed)
  Problem: Education: Goal: Knowledge of General Education information will improve Description Including pain rating scale, medication(s)/side effects and non-pharmacologic comfort measures Outcome: Adequate for Discharge   Problem: Health Behavior/Discharge Planning: Goal: Ability to manage health-related needs will improve Outcome: Adequate for Discharge   

## 2018-05-02 ENCOUNTER — Other Ambulatory Visit: Payer: Self-pay | Admitting: Physician Assistant

## 2018-09-17 ENCOUNTER — Encounter (INDEPENDENT_AMBULATORY_CARE_PROVIDER_SITE_OTHER): Payer: Medicare Other

## 2018-09-17 ENCOUNTER — Ambulatory Visit (INDEPENDENT_AMBULATORY_CARE_PROVIDER_SITE_OTHER): Payer: Medicare Other | Admitting: Vascular Surgery

## 2019-03-21 ENCOUNTER — Emergency Department: Payer: Medicare Other

## 2019-03-21 ENCOUNTER — Encounter: Payer: Self-pay | Admitting: Intensive Care

## 2019-03-21 ENCOUNTER — Emergency Department
Admission: EM | Admit: 2019-03-21 | Discharge: 2019-03-21 | Disposition: A | Discharge status: Home / Self Care | Payer: Medicare Other | Attending: Emergency Medicine | Admitting: Emergency Medicine

## 2019-03-21 ENCOUNTER — Other Ambulatory Visit: Payer: Self-pay

## 2019-03-21 DIAGNOSIS — E119 Type 2 diabetes mellitus without complications: Secondary | ICD-10-CM | POA: Insufficient documentation

## 2019-03-21 DIAGNOSIS — I10 Essential (primary) hypertension: Secondary | ICD-10-CM | POA: Insufficient documentation

## 2019-03-21 DIAGNOSIS — Z7982 Long term (current) use of aspirin: Secondary | ICD-10-CM | POA: Insufficient documentation

## 2019-03-21 DIAGNOSIS — Z87891 Personal history of nicotine dependence: Secondary | ICD-10-CM | POA: Diagnosis not present

## 2019-03-21 DIAGNOSIS — Z7984 Long term (current) use of oral hypoglycemic drugs: Secondary | ICD-10-CM | POA: Insufficient documentation

## 2019-03-21 DIAGNOSIS — Z79899 Other long term (current) drug therapy: Secondary | ICD-10-CM | POA: Insufficient documentation

## 2019-03-21 DIAGNOSIS — Z853 Personal history of malignant neoplasm of breast: Secondary | ICD-10-CM | POA: Insufficient documentation

## 2019-03-21 DIAGNOSIS — R109 Unspecified abdominal pain: Secondary | ICD-10-CM

## 2019-03-21 LAB — BASIC METABOLIC PANEL
Anion gap: 12 (ref 5–15)
BUN: 13 mg/dL (ref 8–23)
CO2: 24 mmol/L (ref 22–32)
Calcium: 9.5 mg/dL (ref 8.9–10.3)
Chloride: 103 mmol/L (ref 98–111)
Creatinine, Ser: 1.03 mg/dL — ABNORMAL HIGH (ref 0.44–1.00)
GFR calc Af Amer: 59 mL/min — ABNORMAL LOW (ref >60)
GFR calc non Af Amer: 51 mL/min — ABNORMAL LOW (ref >60)
Glucose, Bld: 179 mg/dL — ABNORMAL HIGH (ref 70–99)
Potassium: 4 mmol/L (ref 3.5–5.1)
Sodium: 139 mmol/L (ref 135–145)

## 2019-03-21 LAB — CBC
HCT: 38.2 % (ref 36.0–46.0)
Hemoglobin: 12.8 g/dL (ref 12.0–15.0)
MCH: 30.3 pg (ref 26.0–34.0)
MCHC: 33.5 g/dL (ref 30.0–36.0)
MCV: 90.5 fL (ref 80.0–100.0)
Platelets: 265 10*3/uL (ref 150–400)
RBC: 4.22 MIL/uL (ref 3.87–5.11)
RDW: 13.9 % (ref 11.5–15.5)
WBC: 6.6 10*3/uL (ref 4.0–10.5)
nRBC: 0 % (ref 0.0–0.2)

## 2019-03-21 LAB — URINALYSIS, COMPLETE (UACMP) WITH MICROSCOPIC
Bacteria, UA: NONE SEEN
Bilirubin Urine: NEGATIVE
Glucose, UA: NEGATIVE mg/dL
Hgb urine dipstick: NEGATIVE
Ketones, ur: NEGATIVE mg/dL
Nitrite: NEGATIVE
Protein, ur: NEGATIVE mg/dL
Specific Gravity, Urine: 1.016 (ref 1.005–1.030)
pH: 6 (ref 5.0–8.0)

## 2019-03-21 MED ORDER — OXYCODONE-ACETAMINOPHEN 2.5-325 MG PO TABS: 1.0000 | ORAL_TABLET | Freq: Three times a day (TID) | ORAL | 0 refills | Status: DC | PRN

## 2019-03-21 NOTE — ED Provider Notes (Signed)
[facility] Emergency Department Provider Note   First MD Initiated Contact with Patient 03/21/19 1054     (approximate)  I have reviewed the triage vital signs and the nursing notes.   HISTORY  Chief Complaint Flank Pain (right)     HPI Octivia Tissue is a 83 y.o. female with below list of previous medical conditions presents to the emergency department secondary to 3-day history of right flank pain which patient states is currently 8 out of 10.  Patient denies any fever no nausea vomiting diarrhea constipation.  Patient denies any urinary symptoms namely no hematuria dysuria frequency urgency.  Patient denies any constipation or diarrhea.        Past Medical History:  Diagnosis Date   Cancer [facility])    right breast cancer   Diabetes mellitus without complication ([place])    Hypertension     Patient Active Problem List   Diagnosis Date Noted   Elevated troponin 02/03/2018   Diabetes ([place]) 09/12/2017   Hyperlipidemia 09/12/2017    Past Surgical History:  Procedure Laterality Date   BREAST SURGERY     right lumpectomy   KNEE SURGERY     bilateral    Prior to Admission medications   Medication Sig Start Date End Date Taking? Authorizing Provider  acetaminophen (TYLENOL) 325 MG tablet Take 650 mg by mouth every 6 (six) hours as needed for mild pain.     [provider]  allopurinol (ZYLOPRIM) 300 MG tablet Take 1 tablet (300 mg total) by mouth daily. 12/17/17   Caryn Section Linden Dolin, PA-C  aspirin 81 MG tablet Take 81 mg by mouth daily.     [provider]  benazepril-hydrochlorthiazide (LOTENSIN HCT) 20-12.5 MG tablet Take 1 tablet by mouth daily. 01/11/16   [provider]  colchicine 0.6 MG tablet Take 1 tablet (0.6 mg total) by mouth 2 (two) times daily for 7 days. 12/17/17 02/04/19  Fisher, Linden Dolin, PA-C  esomeprazole (NEXIUM) 40 MG capsule Take 1 capsule (40 mg total) by mouth daily. Patient not taking: Reported on  02/03/2018 02/01/16 11/18/18  Drenda Freeze, MD  ibuprofen (ADVIL,MOTRIN) 200 MG tablet Take 200 mg by mouth every 6 (six) hours as needed for moderate pain.    [provider]  linagliptin (TRADJENTA) 5 MG TABS tablet Take 5 mg by mouth daily as needed (high blood sugar).  05/08/17   [provider]  loperamide (LOPERAMIDE A-D) 2 MG tablet Take 2 mg by mouth as needed for diarrhea or loose stools.    [provider]  meclizine (ANTIVERT) 12.5 MG tablet Take 12.5 mg by mouth daily as needed for dizziness.     [provider]  memantine (NAMENDA) 10 MG tablet Take 2 tablets by mouth every evening.  10/26/15   [provider]  metFORMIN (GLUCOPHAGE) 1000 MG tablet Take 1-1.5 tablets by mouth 2 (two) times daily. Take 1 & 1/2 tablet in the morning and 1 tablet in the evening 01/11/16   [provider]  pioglitazone (ACTOS) 45 MG tablet Take 45 mg by mouth daily.  08/29/17   [provider]  polyethylene glycol (MIRALAX / GLYCOLAX) packet Take 17 g by mouth daily as needed (Constipation). 02/04/18   Salary, Holly Bodily D, MD  pravastatin (PRAVACHOL) 20 MG tablet Take 1 tablet by mouth every evening.  01/02/16   [provider]  promethazine (PHENERGAN) 12.5 MG tablet TAKE 1 TABLET BY MOUTH EVERY 6 HOURS AS NEEDED FOR NAUSEA 09/03/14  [provider]  sodium chloride (OCEAN) 0.65 % SOLN nasal spray Place 1 spray into both nostrils as needed for congestion.    [provider]    Allergies Codeine, Etodolac, Indomethacin, Penicillins, Tizanidine, and Tramadol  History reviewed. No pertinent family history.  Social History Social History   Tobacco Use   Smoking status: Former Smoker    Quit date: 1990    Years since quitting: 30.9   Smokeless tobacco: Never Used  Substance Use Topics   Alcohol use: No   Drug use: No    Review of Systems Constitutional: No fever/chills Eyes: No visual changes. ENT: No  sore throat. Cardiovascular: Denies chest pain. Respiratory: Denies shortness of breath. Gastrointestinal: No abdominal pain.  No nausea, no vomiting.  No diarrhea.  No constipation. Genitourinary: Negative for dysuria. Musculoskeletal: Negative for neck pain.  Negative for back pain. Integumentary: Negative for rash. Neurological: Negative for headaches, focal weakness or numbness.   ____________________________________________   PHYSICAL EXAM:  VITAL SIGNS: ED Triage Vitals  Enc Vitals Group     BP 03/21/19 0940 110/71     Pulse Rate 03/21/19 0940 81     Resp 03/21/19 0940 16     Temp 03/21/19 0940 99.2 F (37.3 C)     Temp Source 03/21/19 0940 Oral     SpO2 03/21/19 0940 98 %     Weight 03/21/19 0941 81.6 kg (180 lb)     Height 03/21/19 0941 1.626 m (5\' 4" )     Head Circumference --      Peak Flow --      Pain Score 03/21/19 0941 10     Pain Loc --      Pain Edu? --      Excl. in [place]? --     Constitutional: Alert and oriented.  Eyes: Conjunctivae are normal.  Mouth/Throat: Patient is wearing a mask. Neck: No stridor.  No meningeal signs.   Cardiovascular: Normal rate, regular rhythm. Good peripheral circulation. Grossly normal heart sounds. Respiratory: Normal respiratory effort.  No retractions. Gastrointestinal: Soft and nontender. No distention.  Musculoskeletal: No lower extremity tenderness nor edema. No gross deformities of extremities. Neurologic:  Normal speech and language. No gross focal neurologic deficits are appreciated.  Skin:  Skin is warm, dry and intact. Psychiatric: Mood and affect are normal. Speech and behavior are normal.  ____________________________________________   LABS (all labs ordered are listed, but only abnormal results are displayed)  Labs Reviewed  URINALYSIS, COMPLETE (UACMP) WITH MICROSCOPIC - Abnormal; Notable for the following components:      Result Value   Color, Urine YELLOW (*)    APPearance CLEAR (*)    Leukocytes,Ua  SMALL (*)    All other components within normal limits  BASIC METABOLIC PANEL - Abnormal; Notable for the following components:   Glucose, Bld 179 (*)    Creatinine, Ser 1.03 (*)    GFR calc non Af Amer 51 (*)    GFR calc Af Amer 59 (*)    All other components within normal limits  CBC   ________________________________  RADIOLOGY I, [place] Talbert Trembath, personally viewed and evaluated these images (plain radiographs) as part of my medical decision making, as well as reviewing the written report by the radiologist.    Official radiology report(s): CT Renal Stone Study  Result Date: 03/21/2019 CLINICAL DATA:  83 year old female with history of right-sided flank pain. Suspected kidney stone. EXAM: CT ABDOMEN AND PELVIS WITHOUT CONTRAST TECHNIQUE: Multidetector CT imaging of the  abdomen and pelvis was performed following the standard protocol without IV contrast. COMPARISON:  CT the abdomen and pelvis 12/01/2010. FINDINGS: Lower chest: Aortic atherosclerosis. Calcified atherosclerotic plaque in the right coronary artery. Hepatobiliary: No suspicious cystic or solid hepatic lesions are confidently identified on today's noncontrast CT examination. Unenhanced appearance of the gallbladder is normal. Pancreas: No definite pancreatic mass or peripancreatic fluid collections or inflammatory changes identified on today's noncontrast CT examination. Spleen: Unremarkable. Adrenals/Urinary Tract: No calcifications identified in the collecting system of either kidney, along the course of either ureter, or within the lumen of the urinary bladder. No hydroureteronephrosis or perinephric stranding to suggest urinary tract obstruction. Low-attenuation lesions in both kidneys, incompletely characterized on today's non-contrast CT examination, but statistically likely to represent cysts. Bilateral adrenal glands are normal in appearance. Urinary bladder is normal in appearance. Stomach/Bowel: Normal appearance of the  stomach. No pathologic dilatation of small bowel or colon. Normal appendix. Vascular/Lymphatic: Aortic atherosclerosis. No lymphadenopathy noted in the abdomen or pelvis. Reproductive: Uterus is diffusely enlarged with multiple coarsely calcified lesions, most compatible with fibroids, largest of which measures 4.5 x 3.5 cm. Ovaries are a trophic. Other: No significant volume of ascites.  No pneumoperitoneum. Musculoskeletal: There are no aggressive appearing lytic or blastic lesions noted in the visualized portions of the skeleton. IMPRESSION: 1. No urinary tract calculi or findings of urinary tract obstruction are noted at this time. 2. No acute findings are noted in the abdomen or pelvis to account for the patient's symptoms. 3. Normal appendix. 4. Fibroid uterus. 5. Aortic atherosclerosis, in addition to at least right coronary artery disease. 6. Additional incidental findings, as above. Electronically Signed   By: Vinnie Langton M.D.   On: 03/21/2019 11:32      Procedures   ____________________________________________   INITIAL IMPRESSION / MDM / [place] / ED COURSE  As part of my medical decision making, I reviewed the following data within the [place]   89-year-old female presented with above-stated history and physical exam secondary to right flank pain.  Considered possibility of ureterolithiasis pyelonephritis less likely gallbladder disease diverticulitis.  Laboratory data revealed normal urinalysis as well as creatinine of 1.03 consistent with the patient's previous creatinine.  CT scan revealed no evidence of ureterolithiasis or any other explanation for the patient's discomfort.     ____________________________________________  FINAL CLINICAL IMPRESSION(S) / ED DIAGNOSES  Final diagnoses:  Right flank pain     MEDICATIONS GIVEN DURING THIS VISIT:  Medications - No data to display   ED Discharge Orders    None      *Please note:   [name] [name] was evaluated in Emergency Department on 03/21/2019 for the symptoms described in the history of present illness. She was evaluated in the context of the global COVID-19 pandemic, which necessitated consideration that the patient might be at risk for infection with the SARS-CoV-2 virus that causes COVID-19. Institutional protocols and algorithms that pertain to the evaluation of patients at risk for COVID-19 are in a state of rapid change based on information released by regulatory bodies including the CDC and federal and state organizations. These policies and algorithms were followed during the patient's care in the ED.  Some ED evaluations and interventions may be delayed as a result of limited staffing during the pandemic.*  Note:  This document was prepared using Dragon voice recognition software and may include unintentional dictation errors.   Gregor Hams, MD 03/21/19 [phone]

## 2019-03-21 NOTE — ED Triage Notes (Signed)
Patient c/o right side/flank pain. Denies urinary symptoms. HX diabetes.

## 2019-03-21 NOTE — ED Notes (Signed)
Seen by md on arrival to room.  She is in nad.  Flank pain that she thinks is her kidneys.  Has clear yellow urine specimen.

## 2020-06-09 ENCOUNTER — Emergency Department: Payer: Medicare Other

## 2020-06-09 ENCOUNTER — Other Ambulatory Visit: Payer: Self-pay

## 2020-06-09 ENCOUNTER — Emergency Department
Admission: EM | Admit: 2020-06-09 | Discharge: 2020-06-09 | Disposition: A | Discharge status: Home / Self Care | Payer: Medicare Other | Attending: Emergency Medicine | Admitting: Emergency Medicine

## 2020-06-09 DIAGNOSIS — Z79899 Other long term (current) drug therapy: Secondary | ICD-10-CM | POA: Insufficient documentation

## 2020-06-09 DIAGNOSIS — Z853 Personal history of malignant neoplasm of breast: Secondary | ICD-10-CM | POA: Insufficient documentation

## 2020-06-09 DIAGNOSIS — E119 Type 2 diabetes mellitus without complications: Secondary | ICD-10-CM | POA: Diagnosis not present

## 2020-06-09 DIAGNOSIS — Z7984 Long term (current) use of oral hypoglycemic drugs: Secondary | ICD-10-CM | POA: Diagnosis not present

## 2020-06-09 DIAGNOSIS — I10 Essential (primary) hypertension: Secondary | ICD-10-CM | POA: Diagnosis not present

## 2020-06-09 DIAGNOSIS — Z7982 Long term (current) use of aspirin: Secondary | ICD-10-CM | POA: Insufficient documentation

## 2020-06-09 DIAGNOSIS — M25551 Pain in right hip: Secondary | ICD-10-CM | POA: Diagnosis present

## 2020-06-09 DIAGNOSIS — M79651 Pain in right thigh: Secondary | ICD-10-CM | POA: Diagnosis not present

## 2020-06-09 DIAGNOSIS — Z87891 Personal history of nicotine dependence: Secondary | ICD-10-CM | POA: Insufficient documentation

## 2020-06-09 MED ORDER — KETOROLAC TROMETHAMINE 30 MG/ML IJ SOLN
30.0000 mg | Freq: Once | INTRAMUSCULAR | Status: AC
  Administered 2020-06-09: 30 mg via INTRAMUSCULAR
  Filled 2020-06-09: qty 1

## 2020-06-09 MED ORDER — LIDOCAINE 5 % EX PTCH: 1.0000 | MEDICATED_PATCH | Freq: Two times a day (BID) | CUTANEOUS | 0 refills | Status: AC

## 2020-06-09 MED ORDER — LIDOCAINE 5 % EX PTCH
1.0000 | MEDICATED_PATCH | CUTANEOUS | Status: DC
  Administered 2020-06-09: 1 via TRANSDERMAL
  Filled 2020-06-09: qty 1

## 2020-06-09 NOTE — Discharge Instructions (Addendum)
No acute findings on x-ray of the right hip.  Follow discharge care instruction and take medications as directed.

## 2020-06-09 NOTE — ED Triage Notes (Signed)
Pt comes from [facility] with c/o right sided hip, back and leg pain. Pt denies any recent injuries. Pt states she might just need a shot or something.

## 2020-06-09 NOTE — ED Notes (Signed)
See triage note  Presents with pain to right lower back and hip area  States she developed pain after getting up yesterday  Denies any fall  Thinks she may have twisted wrong

## 2020-06-09 NOTE — ED Provider Notes (Signed)
[facility] Emergency Department Provider Note   ____________________________________________   Event Date/Time   First MD Initiated Contact with Patient 06/09/20 [id number]     (approximate)  I have reviewed the triage vital signs and the nursing notes.   HISTORY  Chief Complaint Back Pain and Hip Pain    HPI [name] [name] is a 85 y.o. female patient presents with 2 days of right lateral hip pain.  Patient said pain radiates mid thigh.  Patient also complaining of back pain which he said is chronic.  Denies bladder or bowel dysfunction.  No radicular component to her back pain.  Rates the pain as a 6/10.  Described pain as "achy".  States no provocative incident for pain.  No palliative measures for pain.  States she believes  needs a "shot ".         Past Medical History:  Diagnosis Date  . Cancer [facility])    right breast cancer  . Diabetes mellitus without complication ([place])   . Hypertension     Patient Active Problem List   Diagnosis Date Noted  . Elevated troponin 02/03/2018  . Diabetes ([place]) 09/12/2017  . Hyperlipidemia 09/12/2017    Past Surgical History:  Procedure Laterality Date  . BREAST SURGERY     right lumpectomy  . KNEE SURGERY     bilateral    Prior to Admission medications   Medication Sig Start Date End Date Taking? Authorizing Provider  lidocaine (LIDODERM) 5 % Place 1 patch onto the skin every 12 (twelve) hours. Remove & Discard patch within 12 hours or as directed by MD 06/09/20 06/09/21 Yes Sable Feil, PA-C  acetaminophen (TYLENOL) 325 MG tablet Take 650 mg by mouth every 6 (six) hours as needed for mild pain.     [provider]  allopurinol (ZYLOPRIM) 300 MG tablet Take 1 tablet (300 mg total) by mouth daily. 12/17/17   Caryn Section Linden Dolin, PA-C  aspirin 81 MG tablet Take 81 mg by mouth daily.     [provider]  benazepril-hydrochlorthiazide (LOTENSIN HCT) 20-12.5 MG tablet Take 1 tablet by mouth daily.  01/11/16   [provider]  colchicine 0.6 MG tablet Take 1 tablet (0.6 mg total) by mouth 2 (two) times daily for 7 days. 12/17/17 02/04/19  Fisher, Linden Dolin, PA-C  ibuprofen (ADVIL,MOTRIN) 200 MG tablet Take 200 mg by mouth every 6 (six) hours as needed for moderate pain.    [provider]  linagliptin (TRADJENTA) 5 MG TABS tablet Take 5 mg by mouth daily as needed (high blood sugar).  05/08/17   [provider]  loperamide (LOPERAMIDE A-D) 2 MG tablet Take 2 mg by mouth as needed for diarrhea or loose stools.    [provider]  meclizine (ANTIVERT) 12.5 MG tablet Take 12.5 mg by mouth daily as needed for dizziness.     [provider]  memantine (NAMENDA) 10 MG tablet Take 2 tablets by mouth every evening.  10/26/15   [provider]  metFORMIN (GLUCOPHAGE) 1000 MG tablet Take 1-1.5 tablets by mouth 2 (two) times daily. Take 1 & 1/2 tablet in the morning and 1 tablet in the evening 01/11/16   [provider]  pioglitazone (ACTOS) 45 MG tablet Take 45 mg by mouth daily.  08/29/17   [provider]  polyethylene glycol (MIRALAX / GLYCOLAX) packet Take 17 g by mouth daily as needed (Constipation). 02/04/18   Salary, Avel Peace, MD  pravastatin (PRAVACHOL) 20 MG tablet  Take 1 tablet by mouth every evening.  01/02/16   [provider]  promethazine (PHENERGAN) 12.5 MG tablet TAKE 1 TABLET BY MOUTH EVERY 6 HOURS AS NEEDED FOR NAUSEA 09/03/14   [provider]  sodium chloride (OCEAN) 0.65 % SOLN nasal spray Place 1 spray into both nostrils as needed for congestion.    [provider]    Allergies Codeine, Etodolac, Indomethacin, Penicillins, Tizanidine, and Tramadol  No family history on file.  Social History Social History   Tobacco Use  . Smoking status: Former Smoker    Quit date: 1990    Years since quitting: 32.1  . Smokeless tobacco: Never Used  Substance Use Topics  . Alcohol use: No  . Drug  use: No    Review of Systems Constitutional: No fever/chills Eyes: No visual changes. ENT: No sore throat. Cardiovascular: Denies chest pain. Respiratory: Denies shortness of breath. Gastrointestinal: No abdominal pain.  No nausea, no vomiting.  No diarrhea.  No constipation. Genitourinary: Negative for dysuria. Musculoskeletal: Negative for back pain. Skin: Negative for rash. Neurological: Negative for headaches, focal weakness or numbness. Endocrine:  Diabetes, hyperlipidemia, hypertension. Allergic/Immunilogical: Codeine, clindamycin, penicillin, and tramadol. ____________________________________________   PHYSICAL EXAM:  VITAL SIGNS: ED Triage Vitals  Enc Vitals Group     BP 06/09/20 0831 (!) 149/64     Pulse Rate 06/09/20 0831 70     Resp 06/09/20 0831 18     Temp 06/09/20 0831 98.4 F (36.9 C)     Temp Source 06/09/20 0831 Oral     SpO2 06/09/20 0831 99 %     Weight 06/09/20 0832 180 lb 12.4 oz (82 kg)     Height 06/09/20 0832 5\' 4"  (1.626 m)     Head Circumference --      Peak Flow --      Pain Score 06/09/20 0828 6     Pain Loc --      Pain Edu? --      Excl. in [place]? --     Constitutional: Alert and oriented. Well appearing and in no acute distress. Cardiovascular: Normal rate, regular rhythm. Grossly normal heart sounds.  Good peripheral circulation. Respiratory: Normal respiratory effort.  No retractions. Lungs CTAB. Musculoskeletal: No obvious hip deformity.  No ligament discrepancy.  Patient is moderate guarding palpation of the greater trochanter.  Patient has full and equal range of motion.  No lower extremity tenderness nor edema.  No joint effusions. Neurologic:  Normal speech and language. No gross focal neurologic deficits are appreciated. No gait instability. Skin:  Skin is warm, dry and intact. No rash noted. Psychiatric: Mood and affect are normal. Speech and behavior are normal.  ____________________________________________   LABS (all labs  ordered are listed, but only abnormal results are displayed)  Labs Reviewed - No data to display ____________________________________________  EKG   ____________________________________________  RADIOLOGY I, Sable Feil, personally viewed and evaluated these images (plain radiographs) as part of my medical decision making, as well as reviewing the written report by the radiologist.  ED MD interpretation: No acute findings x-ray of the right hip.  Official radiology report(s): DG Hip Unilat W or Wo Pelvis 2-3 Views Right  Result Date: 06/09/2020 CLINICAL DATA:  Two days of right hip pain EXAM: DG HIP (WITH OR WITHOUT PELVIS) 2-3V RIGHT COMPARISON:  03/21/2019 FINDINGS: There is no evidence of hip fracture or dislocation. No degenerative hip narrowing or spurring. Osteitis pubis. Lower lumbar spine degeneration. Numerous calcified fibroids. IMPRESSION: No acute  finding or degenerative hip narrowing. Electronically Signed   By: Monte Fantasia M.D.   On: 06/09/2020 09:32    ____________________________________________   PROCEDURES  Procedure(s) performed (including Critical Care):  Procedures   ____________________________________________   INITIAL IMPRESSION / ASSESSMENT AND PLAN / ED COURSE  As part of my medical decision making, I reviewed the following data within the [place]         Patient presents with 2 days of nonprovocative right hip pain.  Discussed no acute findings on x-ray.  Patient complaint physical exam is consistent with muscle skeletal pain.  Patient given discharge care instruction prescription for Lidoderm patches.  Advised follow-up PCP for continued care.      ____________________________________________   FINAL CLINICAL IMPRESSION(S) / ED DIAGNOSES  Final diagnoses:  Right hip pain     ED Discharge Orders         Ordered    lidocaine (LIDODERM) 5 %  Every 12 hours        06/09/20 0942          *Please note:   [name] [name] was evaluated in Emergency Department on 06/09/2020 for the symptoms described in the history of present illness. She was evaluated in the context of the global COVID-19 pandemic, which necessitated consideration that the patient might be at risk for infection with the SARS-CoV-2 virus that causes COVID-19. Institutional protocols and algorithms that pertain to the evaluation of patients at risk for COVID-19 are in a state of rapid change based on information released by regulatory bodies including the CDC and federal and state organizations. These policies and algorithms were followed during the patient's care in the ED.  Some ED evaluations and interventions may be delayed as a result of limited staffing during and the pandemic.*   Note:  This document was prepared using Dragon voice recognition software and may include unintentional dictation errors.    Sable Feil, PA-C 06/09/20 5379    Naaman Plummer, MD 06/09/20 1145

## 2020-07-06 DIAGNOSIS — F0390 Unspecified dementia without behavioral disturbance: Secondary | ICD-10-CM | POA: Diagnosis present

## 2020-11-05 ENCOUNTER — Other Ambulatory Visit: Payer: Self-pay

## 2020-11-05 ENCOUNTER — Emergency Department: Payer: Medicare Other

## 2020-11-05 ENCOUNTER — Emergency Department
Admission: EM | Admit: 2020-11-05 | Discharge: 2020-11-05 | Disposition: A | Discharge status: Home / Self Care | Payer: Medicare Other | Attending: Emergency Medicine | Admitting: Emergency Medicine

## 2020-11-05 DIAGNOSIS — Z7984 Long term (current) use of oral hypoglycemic drugs: Secondary | ICD-10-CM | POA: Insufficient documentation

## 2020-11-05 DIAGNOSIS — Z87891 Personal history of nicotine dependence: Secondary | ICD-10-CM | POA: Insufficient documentation

## 2020-11-05 DIAGNOSIS — Z79899 Other long term (current) drug therapy: Secondary | ICD-10-CM | POA: Diagnosis not present

## 2020-11-05 DIAGNOSIS — Z7982 Long term (current) use of aspirin: Secondary | ICD-10-CM | POA: Insufficient documentation

## 2020-11-05 DIAGNOSIS — M1990 Unspecified osteoarthritis, unspecified site: Secondary | ICD-10-CM | POA: Insufficient documentation

## 2020-11-05 DIAGNOSIS — I10 Essential (primary) hypertension: Secondary | ICD-10-CM | POA: Insufficient documentation

## 2020-11-05 DIAGNOSIS — M25561 Pain in right knee: Secondary | ICD-10-CM | POA: Diagnosis present

## 2020-11-05 DIAGNOSIS — E119 Type 2 diabetes mellitus without complications: Secondary | ICD-10-CM | POA: Diagnosis not present

## 2020-11-05 DIAGNOSIS — Z853 Personal history of malignant neoplasm of breast: Secondary | ICD-10-CM | POA: Insufficient documentation

## 2020-11-05 MED ORDER — HYDROCODONE-ACETAMINOPHEN 5-325 MG PO TABS: 1.0000 | ORAL_TABLET | Freq: Four times a day (QID) | ORAL | 0 refills | Status: DC | PRN

## 2020-11-05 MED ORDER — MELOXICAM 7.5 MG PO TABS: 7.5000 mg | ORAL_TABLET | Freq: Every day | ORAL | 2 refills | Status: AC

## 2020-11-05 NOTE — ED Provider Notes (Signed)
[facility] Emergency Department Provider Note  ____________________________________________   Event Date/Time   First MD Initiated Contact with Patient 11/05/20 1050     (approximate)  I have reviewed the triage vital signs and the nursing notes.   HISTORY  Chief Complaint Knee Pain    HPI [name] [name] is a 85 y.o. female presents emergency department complaining of bilateral knee pain.  Patient states she has had this pain for a while but increased over the last 5 days.  She attributes this to the lumps on her thighs.  Patient's been told she has arthritis.  She denies any known injury.  Past Medical History:  Diagnosis Date   Cancer [facility])    right breast cancer   Diabetes mellitus without complication ([place])    Hypertension     Patient Active Problem List   Diagnosis Date Noted   Elevated troponin 02/03/2018   Diabetes ([place]) 09/12/2017   Hyperlipidemia 09/12/2017    Past Surgical History:  Procedure Laterality Date   BREAST SURGERY     right lumpectomy   KNEE SURGERY     bilateral    Prior to Admission medications   Medication Sig Start Date End Date Taking? Authorizing Provider  HYDROcodone-acetaminophen (NORCO/VICODIN) 5-325 MG tablet Take 1 tablet by mouth every 6 (six) hours as needed for moderate pain. 11/05/20  Yes Aleda Madl, Linden Dolin, PA-C  meloxicam (MOBIC) 7.5 MG tablet Take 1 tablet (7.5 mg total) by mouth daily. 11/05/20 11/05/21 Yes Jabir Dahlem, Linden Dolin, PA-C  acetaminophen (TYLENOL) 325 MG tablet Take 650 mg by mouth every 6 (six) hours as needed for mild pain.     [provider]  allopurinol (ZYLOPRIM) 300 MG tablet Take 1 tablet (300 mg total) by mouth daily. 12/17/17   Caryn Section Linden Dolin, PA-C  aspirin 81 MG tablet Take 81 mg by mouth daily.     [provider]  benazepril-hydrochlorthiazide (LOTENSIN HCT) 20-12.5 MG tablet Take 1 tablet by mouth daily. 01/11/16   [provider]  colchicine 0.6 MG tablet Take  1 tablet (0.6 mg total) by mouth 2 (two) times daily for 7 days. 12/17/17 02/04/19  Lamae Fosco, Linden Dolin, PA-C  lidocaine (LIDODERM) 5 % Place 1 patch onto the skin every 12 (twelve) hours. Remove & Discard patch within 12 hours or as directed by MD 06/09/20 06/09/21  Sable Feil, PA-C  linagliptin (TRADJENTA) 5 MG TABS tablet Take 5 mg by mouth daily as needed (high blood sugar).  05/08/17   [provider]  loperamide (LOPERAMIDE A-D) 2 MG tablet Take 2 mg by mouth as needed for diarrhea or loose stools.    [provider]  meclizine (ANTIVERT) 12.5 MG tablet Take 12.5 mg by mouth daily as needed for dizziness.     [provider]  memantine (NAMENDA) 10 MG tablet Take 2 tablets by mouth every evening.  10/26/15   [provider]  metFORMIN (GLUCOPHAGE) 1000 MG tablet Take 1-1.5 tablets by mouth 2 (two) times daily. Take 1 & 1/2 tablet in the morning and 1 tablet in the evening 01/11/16   [provider]  pioglitazone (ACTOS) 45 MG tablet Take 45 mg by mouth daily.  08/29/17   [provider]  polyethylene glycol (MIRALAX / GLYCOLAX) packet Take 17 g by mouth daily as needed (Constipation). 02/04/18   Salary, Holly Bodily D, MD  pravastatin (PRAVACHOL) 20 MG tablet Take 1 tablet by mouth every evening.  01/02/16   [provider]  promethazine (PHENERGAN) 12.5 MG tablet TAKE 1 TABLET BY MOUTH EVERY 6 HOURS AS NEEDED FOR NAUSEA 09/03/14   [provider]  sodium chloride (OCEAN) 0.65 % SOLN nasal spray Place 1 spray into both nostrils as needed for congestion.    [provider]    Allergies Codeine, Etodolac, Indomethacin, Penicillins, Tizanidine, and Tramadol  History reviewed. No pertinent family history.  Social History Social History   Tobacco Use   Smoking status: Former    Types: Cigarettes    Quit date: 1990    Years since quitting: 32.5   Smokeless tobacco: Never  Substance Use Topics   Alcohol use: No   Drug use:  No    Review of Systems  Constitutional: No fever/chills Eyes: No visual changes. ENT: No sore throat. Respiratory: Denies cough Cardiovascular: Denies chest pain Gastrointestinal: Denies abdominal pain Genitourinary: Negative for dysuria. Musculoskeletal: Negative for back pain. Skin: Negative for rash. Psychiatric: no mood changes,     ____________________________________________   PHYSICAL EXAM:  VITAL SIGNS: ED Triage Vitals  Enc Vitals Group     BP 11/05/20 0948 122/71     Pulse Rate 11/05/20 0948 70     Resp 11/05/20 0948 16     Temp 11/05/20 0948 98.5 F (36.9 C)     Temp Source 11/05/20 0948 Oral     SpO2 11/05/20 0948 100 %     Weight 11/05/20 0949 185 lb (83.9 kg)     Height 11/05/20 0949 '5\' 4"'$  (1.626 m)     Head Circumference --      Peak Flow --      Pain Score 11/05/20 0949 10     Pain Loc --      Pain Edu? --      Excl. in [place]? --     Constitutional: Alert and oriented. Well appearing and in no acute distress. Eyes: Conjunctivae are normal.  Head: Atraumatic. Nose: No congestion/rhinnorhea. Mouth/Throat: Mucous membranes are moist.   Neck:  supple no lymphadenopathy noted Cardiovascular: Normal rate, regular rhythm Respiratory: Normal respiratory effort.  No retractions GU: deferred Musculoskeletal: FROM all extremities, warm and well perfused, lipomas noted in the upper thighs, knees are tender at the joint line bilaterally, neurovascular is intact Neurologic:  Normal speech and language.  Skin:  Skin is warm, dry and intact. No rash noted. Psychiatric: Mood and affect are normal. Speech and behavior are normal.  ____________________________________________   LABS (all labs ordered are listed, but only abnormal results are displayed)  Labs Reviewed - No data to display ____________________________________________   ____________________________________________  RADIOLOGY  X-ray of the right and left  knees  ____________________________________________   PROCEDURES  Procedure(s) performed: No  Procedures    ____________________________________________   INITIAL IMPRESSION / ASSESSMENT AND PLAN / ED COURSE  Pertinent labs & imaging results that were available during my care of the patient were reviewed by me and considered in my medical decision making (see chart for details).   The patient is an 85 year old female presents with bilateral knee pain.  See HPI.  Physical exam shows patient have difficulty walking secondary discomfort  X-ray of the right and left knee show tricompartment arthritis.  Reviewed by me and confirmed by radiology  And explained the findings to the patient and her family.  Patient has no history of GI bleeding and has good kidney function so I will place her on meloxicam 7.5 mg daily.  She is to take this medication but if she sees dark  stools stop taking the medication immediately.  She was given 6 hydrocodone for severe pain.  She is to follow-up with orthopedics who she made an appointment with while here in the ED.  She was discharged stable condition care of her daughter     [name] [name] was evaluated in Emergency Department on 11/05/2020 for the symptoms described in the history of present illness. She was evaluated in the context of the global COVID-19 pandemic, which necessitated consideration that the patient might be at risk for infection with the SARS-CoV-2 virus that causes COVID-19. Institutional protocols and algorithms that pertain to the evaluation of patients at risk for COVID-19 are in a state of rapid change based on information released by regulatory bodies including the CDC and federal and state organizations. These policies and algorithms were followed during the patient's care in the ED.    As part of my medical decision making, I reviewed the following data within the [place] History obtained from family, Nursing  notes reviewed and incorporated, Old chart reviewed, Radiograph reviewed , Notes from prior ED visits, and [place] Controlled Substance Database  ____________________________________________   FINAL CLINICAL IMPRESSION(S) / ED DIAGNOSES  Final diagnoses:  Arthritis      NEW MEDICATIONS STARTED DURING THIS VISIT:  New Prescriptions   HYDROCODONE-ACETAMINOPHEN (NORCO/VICODIN) 5-325 MG TABLET    Take 1 tablet by mouth every 6 (six) hours as needed for moderate pain.   MELOXICAM (MOBIC) 7.5 MG TABLET    Take 1 tablet (7.5 mg total) by mouth daily.     Note:  This document was prepared using Dragon voice recognition software and may include unintentional dictation errors.    Versie Starks, PA-C 11/05/20 1258    Vladimir Crofts, MD 11/05/20 1537

## 2020-11-05 NOTE — ED Triage Notes (Signed)
Pt comes into the ED via EMS from [facility]. With c/o BL knee pain for the past 5 days, denies injury, no obvious swelling or redness, deformity.

## 2020-11-17 ENCOUNTER — Other Ambulatory Visit: Payer: Self-pay | Admitting: Internal Medicine

## 2020-11-17 DIAGNOSIS — M79604 Pain in right leg: Secondary | ICD-10-CM

## 2020-11-17 DIAGNOSIS — R2241 Localized swelling, mass and lump, right lower limb: Secondary | ICD-10-CM

## 2020-11-30 ENCOUNTER — Ambulatory Visit: Payer: PRIVATE HEALTH INSURANCE

## 2020-12-08 ENCOUNTER — Ambulatory Visit
Admission: RE | Admit: 2020-12-08 | Discharge: 2020-12-08 | Disposition: A | Discharge status: Home / Self Care | Payer: Medicare Other | Source: Ambulatory Visit | Attending: Internal Medicine | Admitting: Internal Medicine

## 2020-12-08 ENCOUNTER — Other Ambulatory Visit: Payer: Self-pay | Admitting: Internal Medicine

## 2020-12-08 ENCOUNTER — Other Ambulatory Visit: Payer: Self-pay

## 2020-12-08 DIAGNOSIS — R2241 Localized swelling, mass and lump, right lower limb: Secondary | ICD-10-CM | POA: Insufficient documentation

## 2020-12-08 DIAGNOSIS — M79604 Pain in right leg: Secondary | ICD-10-CM | POA: Insufficient documentation

## 2020-12-08 LAB — POCT I-STAT CREATININE: Creatinine, Ser: 1 mg/dL (ref 0.44–1.00)

## 2020-12-08 MED ORDER — IOHEXOL 350 MG/ML SOLN
85.0000 mL | Freq: Once | INTRAVENOUS | Status: AC | PRN
  Administered 2020-12-08: 85 mL via INTRAVENOUS

## 2023-03-15 IMAGING — DX DG KNEE COMPLETE 4+V*R*
4 series · 5 of 5 positions shown · non-contrast
Comparison: None

CLINICAL DATA: Pain.  Difficulty walking.

EXAM:
RIGHT KNEE - COMPLETE 4+ VIEW

[knee ap]
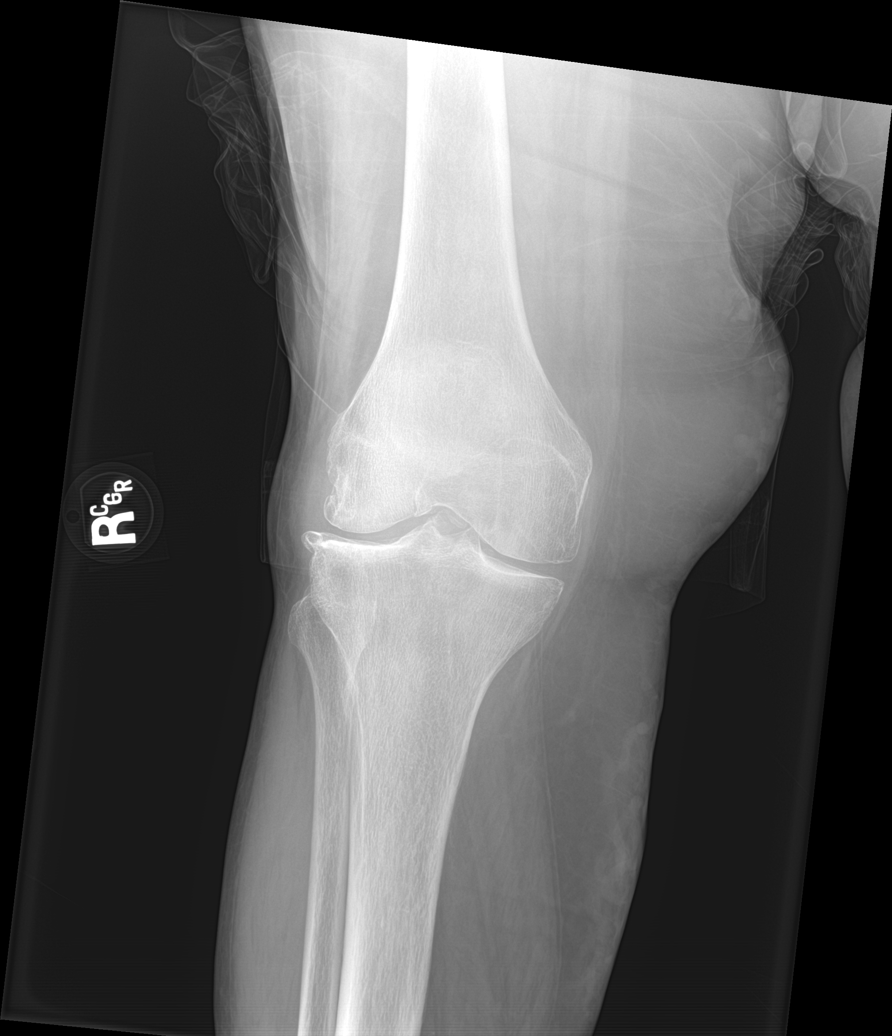

[knee lat]
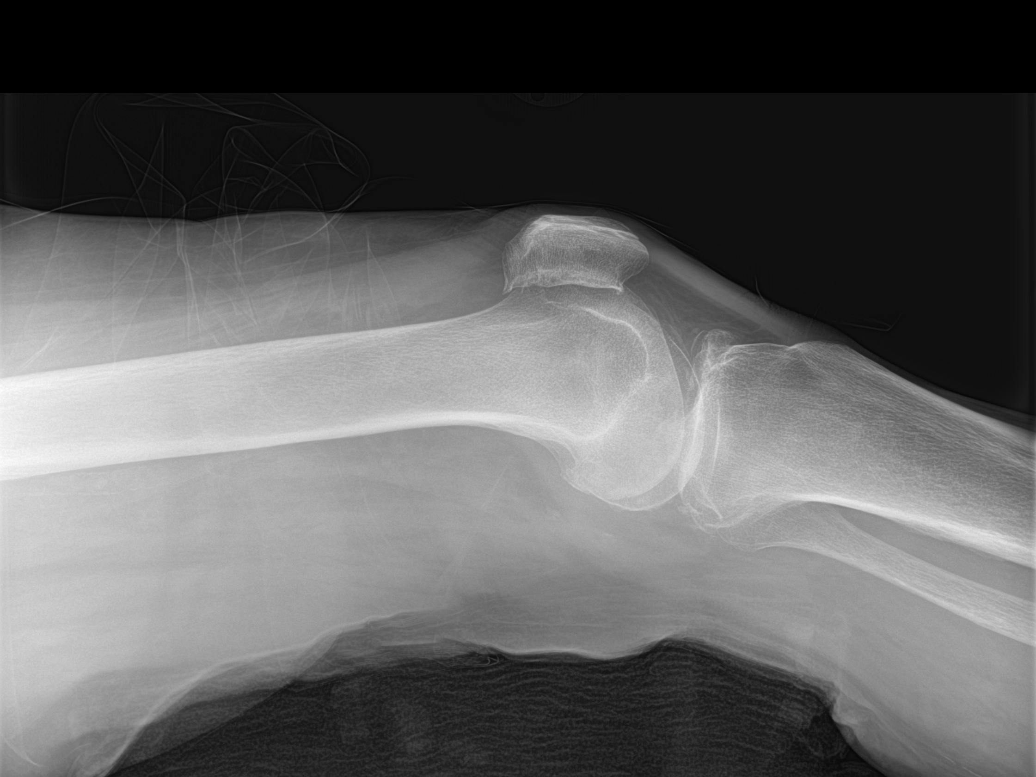

[Series 5: knee obl · 0.14mm/px · 2 of 2 slices shown (1 of 2)]
[im 1/2]
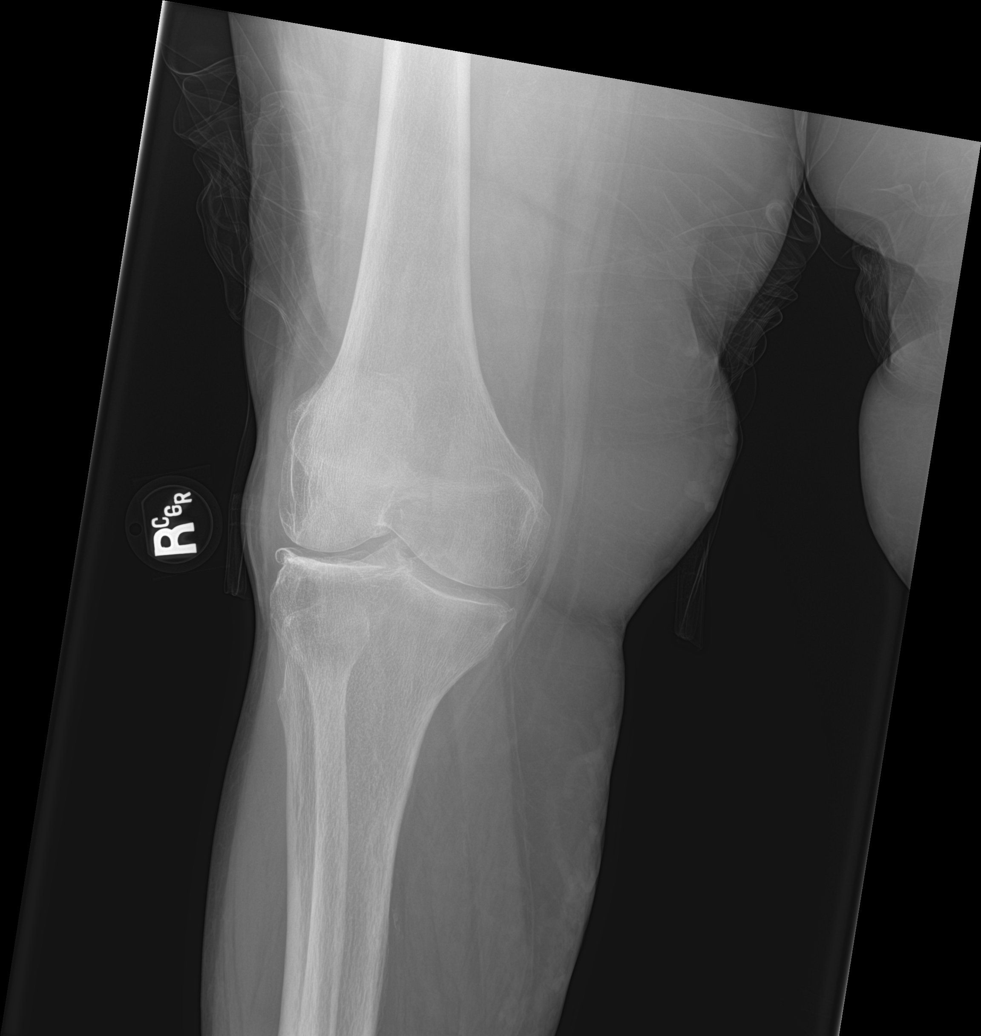
[im 2/2]
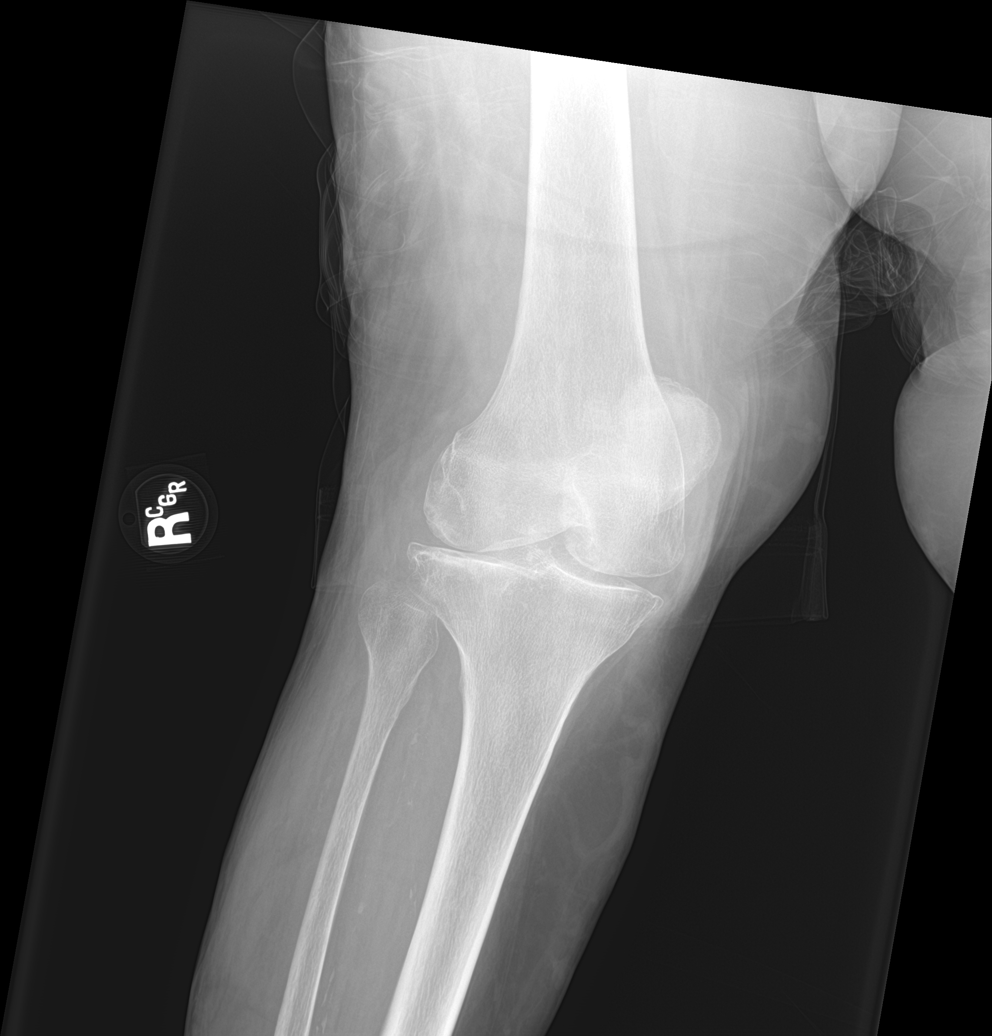

[knee obl (2 of 2)]
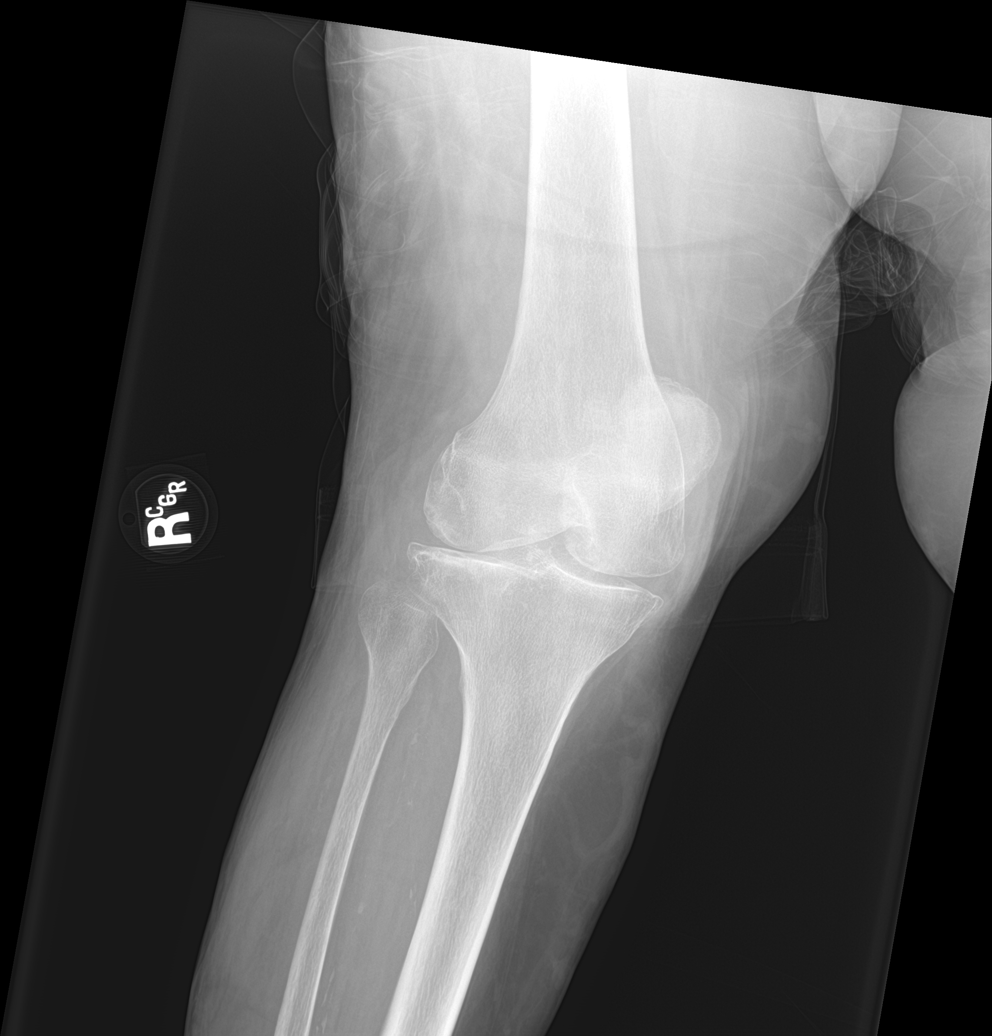

[5 of 5 positions shown; findings below may reference images not displayed]

FINDINGS: No joint effusion. No fracture or dislocation identified. The
moderate tricompartment osteoarthritis is noted with joint space
narrowing and marginal spur formation. The soft tissues are
unremarkable.
IMPRESSION: 1. No acute findings.
2. Moderate tricompartment osteoarthritis.

## 2023-03-30 ENCOUNTER — Emergency Department: Payer: Medicare Other

## 2023-03-30 ENCOUNTER — Inpatient Hospital Stay
Admission: EM | Admit: 2023-03-30 | Discharge: 2023-04-03 | DRG: 291 | Disposition: A | Discharge status: Home Health | Payer: Medicare Other | Attending: Osteopathic Medicine | Admitting: Osteopathic Medicine

## 2023-03-30 ENCOUNTER — Other Ambulatory Visit: Payer: Self-pay

## 2023-03-30 DIAGNOSIS — I50810 Right heart failure, unspecified: Secondary | ICD-10-CM

## 2023-03-30 DIAGNOSIS — J9811 Atelectasis: Secondary | ICD-10-CM | POA: Diagnosis present

## 2023-03-30 DIAGNOSIS — Z7984 Long term (current) use of oral hypoglycemic drugs: Secondary | ICD-10-CM

## 2023-03-30 DIAGNOSIS — F0390 Unspecified dementia without behavioral disturbance: Secondary | ICD-10-CM | POA: Diagnosis present

## 2023-03-30 DIAGNOSIS — I2510 Atherosclerotic heart disease of native coronary artery without angina pectoris: Secondary | ICD-10-CM | POA: Diagnosis present

## 2023-03-30 DIAGNOSIS — I5023 Acute on chronic systolic (congestive) heart failure: Secondary | ICD-10-CM | POA: Diagnosis present

## 2023-03-30 DIAGNOSIS — Z885 Allergy status to narcotic agent status: Secondary | ICD-10-CM

## 2023-03-30 DIAGNOSIS — Z87891 Personal history of nicotine dependence: Secondary | ICD-10-CM

## 2023-03-30 DIAGNOSIS — E785 Hyperlipidemia, unspecified: Secondary | ICD-10-CM | POA: Diagnosis present

## 2023-03-30 DIAGNOSIS — M109 Gout, unspecified: Secondary | ICD-10-CM | POA: Diagnosis present

## 2023-03-30 DIAGNOSIS — E1151 Type 2 diabetes mellitus with diabetic peripheral angiopathy without gangrene: Secondary | ICD-10-CM | POA: Diagnosis present

## 2023-03-30 DIAGNOSIS — I10 Essential (primary) hypertension: Secondary | ICD-10-CM | POA: Diagnosis present

## 2023-03-30 DIAGNOSIS — Z8673 Personal history of transient ischemic attack (TIA), and cerebral infarction without residual deficits: Secondary | ICD-10-CM

## 2023-03-30 DIAGNOSIS — E119 Type 2 diabetes mellitus without complications: Secondary | ICD-10-CM

## 2023-03-30 DIAGNOSIS — Z9889 Other specified postprocedural states: Secondary | ICD-10-CM

## 2023-03-30 DIAGNOSIS — I739 Peripheral vascular disease, unspecified: Secondary | ICD-10-CM | POA: Diagnosis present

## 2023-03-30 DIAGNOSIS — I110 Hypertensive heart disease with heart failure: Principal | ICD-10-CM | POA: Diagnosis present

## 2023-03-30 DIAGNOSIS — Z66 Do not resuscitate: Secondary | ICD-10-CM | POA: Diagnosis present

## 2023-03-30 DIAGNOSIS — R7989 Other specified abnormal findings of blood chemistry: Secondary | ICD-10-CM | POA: Diagnosis present

## 2023-03-30 DIAGNOSIS — Z7982 Long term (current) use of aspirin: Secondary | ICD-10-CM

## 2023-03-30 DIAGNOSIS — Z9181 History of falling: Secondary | ICD-10-CM

## 2023-03-30 DIAGNOSIS — I5082 Biventricular heart failure: Secondary | ICD-10-CM | POA: Diagnosis present

## 2023-03-30 DIAGNOSIS — Z853 Personal history of malignant neoplasm of breast: Secondary | ICD-10-CM

## 2023-03-30 DIAGNOSIS — Z8719 Personal history of other diseases of the digestive system: Secondary | ICD-10-CM

## 2023-03-30 DIAGNOSIS — I4719 Other supraventricular tachycardia: Secondary | ICD-10-CM | POA: Diagnosis not present

## 2023-03-30 DIAGNOSIS — Z888 Allergy status to other drugs, medicaments and biological substances status: Secondary | ICD-10-CM

## 2023-03-30 DIAGNOSIS — Z79899 Other long term (current) drug therapy: Secondary | ICD-10-CM

## 2023-03-30 DIAGNOSIS — Z880 Allergy status to penicillin: Secondary | ICD-10-CM

## 2023-03-30 DIAGNOSIS — R5381 Other malaise: Secondary | ICD-10-CM | POA: Diagnosis present

## 2023-03-30 DIAGNOSIS — R600 Localized edema: Secondary | ICD-10-CM

## 2023-03-30 DIAGNOSIS — R42 Dizziness and giddiness: Principal | ICD-10-CM

## 2023-03-30 DIAGNOSIS — Z8711 Personal history of peptic ulcer disease: Secondary | ICD-10-CM

## 2023-03-30 DIAGNOSIS — E876 Hypokalemia: Secondary | ICD-10-CM | POA: Diagnosis not present

## 2023-03-30 DIAGNOSIS — I509 Heart failure, unspecified: Secondary | ICD-10-CM

## 2023-03-30 DIAGNOSIS — I083 Combined rheumatic disorders of mitral, aortic and tricuspid valves: Secondary | ICD-10-CM | POA: Diagnosis present

## 2023-03-30 HISTORY — DX: Cerebral infarction, unspecified: I63.9

## 2023-03-30 LAB — URINALYSIS, ROUTINE W REFLEX MICROSCOPIC
Bacteria, UA: NONE SEEN
Bilirubin Urine: NEGATIVE
Glucose, UA: NEGATIVE mg/dL
Hgb urine dipstick: NEGATIVE
Ketones, ur: 5 mg/dL — AB
Nitrite: NEGATIVE
Protein, ur: >=300 mg/dL — AB
Specific Gravity, Urine: 1.017 (ref 1.005–1.030)
pH: 5 (ref 5.0–8.0)

## 2023-03-30 LAB — BRAIN NATRIURETIC PEPTIDE: B Natriuretic Peptide: 1313.4 pg/mL — ABNORMAL HIGH (ref 0.0–100.0)

## 2023-03-30 LAB — CBC
HCT: 37.6 % (ref 36.0–46.0)
Hemoglobin: 12.5 g/dL (ref 12.0–15.0)
MCH: 30.8 pg (ref 26.0–34.0)
MCHC: 33.2 g/dL (ref 30.0–36.0)
MCV: 92.6 fL (ref 80.0–100.0)
Platelets: 291 10*3/uL (ref 150–400)
RBC: 4.06 MIL/uL (ref 3.87–5.11)
RDW: 14.5 % (ref 11.5–15.5)
WBC: 6.4 10*3/uL (ref 4.0–10.5)
nRBC: 0 % (ref 0.0–0.2)

## 2023-03-30 LAB — BASIC METABOLIC PANEL
Anion gap: 16 — ABNORMAL HIGH (ref 5–15)
BUN: 15 mg/dL (ref 8–23)
CO2: 19 mmol/L — ABNORMAL LOW (ref 22–32)
Calcium: 8.7 mg/dL — ABNORMAL LOW (ref 8.9–10.3)
Chloride: 100 mmol/L (ref 98–111)
Creatinine, Ser: 1.16 mg/dL — ABNORMAL HIGH (ref 0.44–1.00)
GFR, Estimated: 46 mL/min — ABNORMAL LOW (ref >60)
Glucose, Bld: 133 mg/dL — ABNORMAL HIGH (ref 70–99)
Potassium: 3.8 mmol/L (ref 3.5–5.1)
Sodium: 135 mmol/L (ref 135–145)

## 2023-03-30 LAB — TROPONIN I (HIGH SENSITIVITY)
Troponin I (High Sensitivity): 246 ng/L (ref <18)
Troponin I (High Sensitivity): 255 ng/L (ref <18)

## 2023-03-30 LAB — CBG MONITORING, ED: Glucose-Capillary: 101 mg/dL — ABNORMAL HIGH (ref 70–99)

## 2023-03-30 MED ORDER — IOHEXOL 350 MG/ML SOLN
75.0000 mL | Freq: Once | INTRAVENOUS | Status: AC | PRN
  Administered 2023-03-30: 75 mL via INTRAVENOUS

## 2023-03-30 MED ORDER — FUROSEMIDE 20 MG PO TABS: 20.0000 mg | ORAL_TABLET | Freq: Every day | ORAL | 0 refills | Status: DC

## 2023-03-30 NOTE — ED Triage Notes (Signed)
First Nurse Note;  Pt via [facility] from [facility]. Pt c/o weakness for the past week and bilateral feet swelling and dizziness. Pt is A&Ox4 and NAD 161/102 BP  101 HR  97.8 orally  186 CBG with hx of DM  Pt has a hx of stroke

## 2023-03-30 NOTE — ED Provider Notes (Signed)
[facility] Provider Note   Event Date/Time   First MD Initiated Contact with Patient 03/30/23 2050     (approximate) History  Dizziness  HPI [name] [name] is a 87 y.o. female with stated past medical history of type 2 diabetes, hyperlipidemia, and CHF with echo from last year showing LVEF of 30% who presents complaining of worsening bilateral lower extremity edema and lightheadedness.  Patient states that she has been having worsening lightheadedness and dyspnea on exertion that has been worsening over the last 2 weeks.  Patient denies any change in her medications. ROS: Patient currently denies any vision changes, tinnitus, difficulty speaking, facial droop, sore throat, chest pain, abdominal pain, nausea/vomiting/diarrhea, dysuria, or numbness/paresthesias in any extremity   Physical Exam  Triage Vital Signs: ED Triage Vitals  Encounter Vitals Group     BP 03/30/23 1630 (!) 108/90     Systolic BP Percentile --      Diastolic BP Percentile --      Pulse Rate 03/30/23 1630 96     Resp 03/30/23 1630 18     Temp 03/30/23 1630 98.5 F (36.9 C)     Temp Source 03/30/23 1630 Oral     SpO2 03/30/23 1630 98 %     Weight 03/30/23 1631 152 lb (68.9 kg)     Height 03/30/23 1631 5\' 5"  (1.651 m)     Head Circumference --      Peak Flow --      Pain Score 03/30/23 1631 0     Pain Loc --      Pain Education --      Exclude from Growth Chart --    Most recent vital signs: Vitals:   03/30/23 2057 03/30/23 2300  BP: (!) 151/93 126/79  Pulse: 94 87  Resp: 16 17  Temp: 98.3 F (36.8 C)   SpO2: 100% 100%   General: Awake, oriented x4. CV:  Good peripheral perfusion.  Resp:  Normal effort.  Abd:  No distention.  Other:  Elderly well-developed, well-nourished African-American female resting comfortably in no acute distress.  2+ pitting edema to bilateral lower extremities ED Results / Procedures / Treatments  Labs (all labs ordered are listed, but only abnormal  results are displayed) Labs Reviewed  BASIC METABOLIC PANEL - Abnormal; Notable for the following components:      Result Value   CO2 19 (*)    Glucose, Bld 133 (*)    Creatinine, Ser 1.16 (*)    Calcium 8.7 (*)    GFR, Estimated 46 (*)    Anion gap 16 (*)    All other components within normal limits  URINALYSIS, ROUTINE W REFLEX MICROSCOPIC - Abnormal; Notable for the following components:   Color, Urine YELLOW (*)    APPearance CLEAR (*)    Ketones, ur 5 (*)    Protein, ur >=300 (*)    Leukocytes,Ua TRACE (*)    All other components within normal limits  BRAIN NATRIURETIC PEPTIDE - Abnormal; Notable for the following components:   B Natriuretic Peptide 1,313.4 (*)    All other components within normal limits  CBG MONITORING, ED - Abnormal; Notable for the following components:   Glucose-Capillary 101 (*)    All other components within normal limits  TROPONIN I (HIGH SENSITIVITY) - Abnormal; Notable for the following components:   Troponin I (High Sensitivity) 246 (*)    All other components within normal limits  TROPONIN I (HIGH SENSITIVITY) - Abnormal; Notable for the  following components:   Troponin I (High Sensitivity) 255 (*)    All other components within normal limits  CBC   EKG ED ECG REPORT I, Merwyn Katos, the attending physician, personally viewed and interpreted this ECG. Date: 03/30/2023 EKG Time: 1635 Rate: 96 Rhythm: normal sinus rhythm QRS Axis: normal Intervals: normal ST/T Wave abnormalities: normal Narrative Interpretation: no evidence of acute ischemia RADIOLOGY ED MD interpretation: Chest x-ray shows right-sided pleural effusion.  CT angiography of the chest pending -Agree with radiology assessment Official radiology report(s): DG Chest Port 1 View Result Date: 03/30/2023 CLINICAL DATA:  Dizziness EXAM: PORTABLE CHEST 1 VIEW COMPARISON:  02/01/2016 FINDINGS: Cardiac shadow is enlarged. Aortic calcifications are noted. The lungs are well  aerated bilaterally. Small right pleural effusion is seen. No bony abnormality is noted. IMPRESSION: Small right pleural effusion. Electronically Signed   By: Alcide Clever M.D.   On: 03/30/2023 23:09   PROCEDURES: Critical Care performed: No .1-3 Lead EKG Interpretation  Performed by: Merwyn Katos, MD Authorized by: Merwyn Katos, MD     Interpretation: normal     ECG rate:  71   ECG rate assessment: normal     Rhythm: sinus rhythm     Ectopy: none     Conduction: normal    MEDICATIONS ORDERED IN ED: Medications  iohexol (OMNIPAQUE) 350 MG/ML injection 75 mL (75 mLs Intravenous Contrast Given 03/30/23 2307)   IMPRESSION / MDM / ASSESSMENT AND PLAN / ED COURSE  I reviewed the triage vital signs and the nursing notes.                             The patient is on the cardiac monitor to evaluate for evidence of arrhythmia and/or significant heart rate changes. Patient's presentation is most consistent with acute presentation with potential threat to life or bodily function. Denies dyspnea, endorses LE edema Denies Non adherence to medication regimen  Workup: ECG, CBC, BMP, Troponin, BNP, CXR Findings: EKG: No STEMI and no evidence of Brugada's sign, delta wave, epsilon wave, significantly prolonged QTc, or malignant arrhythmia. BNP: 1313 CXR: Right-sided pleural effusion Based on history, exam and findings, presentation most consistent with acute on chronic heart failure. Low suspicion for PNA, ACS, tamponade, aortic dissection. Patient has elevated troponin that is stable on repeat and therefore likely secondary to patient's chronic heart failure.  Patient is not requiring any oxygen and shows no signs of respiratory distress at this time.  Patient's BNP only 1313 and has evidence of right-sided pleural effusion.  On independent evaluation of this x-ray, she also has patchy opacities superior to this pleural effusion concerning for possible infectious process.  As patient was  also tachycardic upon arrival, she will require a CT angiography of the chest to rule out pneumonia versus pulmonary embolism.  Care of this patient will be signed out to the oncoming physician at the end of my shift.  All pertinent patient information conveyed and all questions answered.  All further care and disposition decisions will be made by the oncoming physician.   FINAL CLINICAL IMPRESSION(S) / ED DIAGNOSES   Final diagnoses:  Lightheadedness  Bilateral lower extremity edema  Troponin level elevated   Rx / DC Orders   ED Discharge Orders          Ordered    furosemide (LASIX) 20 MG tablet  Daily        03/30/23 2327    Ambulatory  referral to Cardiology       Comments: If you have not heard from the Cardiology office within the next 72 hours please call [phone].   03/30/23 2329           Note:  This document was prepared using Dragon voice recognition software and may include unintentional dictation errors.   Merwyn Katos, MD 03/30/23 [phone]

## 2023-03-30 NOTE — ED Notes (Signed)
Lab reports troponin results; acuity level changed and pt taken to room 25 via w/c by EDT Drucie Ip for further eval; report called to [facility]

## 2023-03-30 NOTE — ED Triage Notes (Signed)
Patient states dizziness and bilateral leg swelling for one week.

## 2023-03-31 ENCOUNTER — Inpatient Hospital Stay: Payer: Medicare Other

## 2023-03-31 ENCOUNTER — Telehealth (HOSPITAL_COMMUNITY): Payer: Self-pay

## 2023-03-31 ENCOUNTER — Other Ambulatory Visit (HOSPITAL_COMMUNITY): Payer: Self-pay

## 2023-03-31 DIAGNOSIS — Z880 Allergy status to penicillin: Secondary | ICD-10-CM | POA: Diagnosis not present

## 2023-03-31 DIAGNOSIS — R42 Dizziness and giddiness: Secondary | ICD-10-CM | POA: Diagnosis not present

## 2023-03-31 DIAGNOSIS — Z79899 Other long term (current) drug therapy: Secondary | ICD-10-CM | POA: Diagnosis not present

## 2023-03-31 DIAGNOSIS — Z66 Do not resuscitate: Secondary | ICD-10-CM | POA: Diagnosis present

## 2023-03-31 DIAGNOSIS — I5023 Acute on chronic systolic (congestive) heart failure: Secondary | ICD-10-CM | POA: Diagnosis present

## 2023-03-31 DIAGNOSIS — Z8711 Personal history of peptic ulcer disease: Secondary | ICD-10-CM | POA: Diagnosis not present

## 2023-03-31 DIAGNOSIS — E1151 Type 2 diabetes mellitus with diabetic peripheral angiopathy without gangrene: Secondary | ICD-10-CM | POA: Diagnosis present

## 2023-03-31 DIAGNOSIS — E785 Hyperlipidemia, unspecified: Secondary | ICD-10-CM | POA: Diagnosis present

## 2023-03-31 DIAGNOSIS — R7989 Other specified abnormal findings of blood chemistry: Secondary | ICD-10-CM | POA: Diagnosis not present

## 2023-03-31 DIAGNOSIS — Z8673 Personal history of transient ischemic attack (TIA), and cerebral infarction without residual deficits: Secondary | ICD-10-CM

## 2023-03-31 DIAGNOSIS — I110 Hypertensive heart disease with heart failure: Secondary | ICD-10-CM | POA: Diagnosis present

## 2023-03-31 DIAGNOSIS — E119 Type 2 diabetes mellitus without complications: Secondary | ICD-10-CM

## 2023-03-31 DIAGNOSIS — Z7984 Long term (current) use of oral hypoglycemic drugs: Secondary | ICD-10-CM | POA: Diagnosis not present

## 2023-03-31 DIAGNOSIS — Z87891 Personal history of nicotine dependence: Secondary | ICD-10-CM | POA: Diagnosis not present

## 2023-03-31 DIAGNOSIS — Z9181 History of falling: Secondary | ICD-10-CM | POA: Diagnosis not present

## 2023-03-31 DIAGNOSIS — F0390 Unspecified dementia without behavioral disturbance: Secondary | ICD-10-CM | POA: Diagnosis present

## 2023-03-31 DIAGNOSIS — I5082 Biventricular heart failure: Secondary | ICD-10-CM | POA: Diagnosis present

## 2023-03-31 DIAGNOSIS — Z7982 Long term (current) use of aspirin: Secondary | ICD-10-CM | POA: Diagnosis not present

## 2023-03-31 DIAGNOSIS — J9811 Atelectasis: Secondary | ICD-10-CM | POA: Diagnosis present

## 2023-03-31 DIAGNOSIS — R5381 Other malaise: Secondary | ICD-10-CM | POA: Diagnosis present

## 2023-03-31 DIAGNOSIS — E876 Hypokalemia: Secondary | ICD-10-CM | POA: Diagnosis not present

## 2023-03-31 DIAGNOSIS — M109 Gout, unspecified: Secondary | ICD-10-CM | POA: Diagnosis present

## 2023-03-31 DIAGNOSIS — I4719 Other supraventricular tachycardia: Secondary | ICD-10-CM | POA: Diagnosis not present

## 2023-03-31 DIAGNOSIS — Z885 Allergy status to narcotic agent status: Secondary | ICD-10-CM | POA: Diagnosis not present

## 2023-03-31 DIAGNOSIS — I083 Combined rheumatic disorders of mitral, aortic and tricuspid valves: Secondary | ICD-10-CM | POA: Diagnosis present

## 2023-03-31 DIAGNOSIS — R600 Localized edema: Secondary | ICD-10-CM | POA: Diagnosis not present

## 2023-03-31 DIAGNOSIS — Z853 Personal history of malignant neoplasm of breast: Secondary | ICD-10-CM | POA: Diagnosis not present

## 2023-03-31 DIAGNOSIS — I2510 Atherosclerotic heart disease of native coronary artery without angina pectoris: Secondary | ICD-10-CM | POA: Diagnosis present

## 2023-03-31 LAB — HEPATIC FUNCTION PANEL
ALT: 14 U/L (ref 0–44)
AST: 28 U/L (ref 15–41)
Albumin: 3.6 g/dL (ref 3.5–5.0)
Alkaline Phosphatase: 67 U/L (ref 38–126)
Bilirubin, Direct: 0.3 mg/dL — ABNORMAL HIGH (ref 0.0–0.2)
Indirect Bilirubin: 1 mg/dL — ABNORMAL HIGH (ref 0.3–0.9)
Total Bilirubin: 1.3 mg/dL — ABNORMAL HIGH (ref <1.2)
Total Protein: 6.9 g/dL (ref 6.5–8.1)

## 2023-03-31 LAB — CBC WITH DIFFERENTIAL/PLATELET
Abs Immature Granulocytes: 0.02 10*3/uL (ref 0.00–0.07)
Basophils Absolute: 0 10*3/uL (ref 0.0–0.1)
Basophils Relative: 1 %
Eosinophils Absolute: 0 10*3/uL (ref 0.0–0.5)
Eosinophils Relative: 1 %
HCT: 37.4 % (ref 36.0–46.0)
Hemoglobin: 12.7 g/dL (ref 12.0–15.0)
Immature Granulocytes: 0 %
Lymphocytes Relative: 18 %
Lymphs Abs: 1 10*3/uL (ref 0.7–4.0)
MCH: 31.2 pg (ref 26.0–34.0)
MCHC: 34 g/dL (ref 30.0–36.0)
MCV: 91.9 fL (ref 80.0–100.0)
Monocytes Absolute: 0.4 10*3/uL (ref 0.1–1.0)
Monocytes Relative: 7 %
Neutro Abs: 4.2 10*3/uL (ref 1.7–7.7)
Neutrophils Relative %: 73 %
Platelets: 262 10*3/uL (ref 150–400)
RBC: 4.07 MIL/uL (ref 3.87–5.11)
RDW: 14.5 % (ref 11.5–15.5)
WBC: 5.7 10*3/uL (ref 4.0–10.5)
nRBC: 0 % (ref 0.0–0.2)

## 2023-03-31 LAB — BASIC METABOLIC PANEL
Anion gap: 17 — ABNORMAL HIGH (ref 5–15)
BUN: 13 mg/dL (ref 8–23)
CO2: 23 mmol/L (ref 22–32)
Calcium: 8.8 mg/dL — ABNORMAL LOW (ref 8.9–10.3)
Chloride: 99 mmol/L (ref 98–111)
Creatinine, Ser: 1.13 mg/dL — ABNORMAL HIGH (ref 0.44–1.00)
GFR, Estimated: 48 mL/min — ABNORMAL LOW (ref >60)
Glucose, Bld: 119 mg/dL — ABNORMAL HIGH (ref 70–99)
Potassium: 3.4 mmol/L — ABNORMAL LOW (ref 3.5–5.1)
Sodium: 139 mmol/L (ref 135–145)

## 2023-03-31 LAB — HEMOGLOBIN A1C
Hgb A1c MFr Bld: 6.4 % — ABNORMAL HIGH (ref 4.8–5.6)
Mean Plasma Glucose: 136.98 mg/dL

## 2023-03-31 LAB — CBG MONITORING, ED
Glucose-Capillary: 104 mg/dL — ABNORMAL HIGH (ref 70–99)
Glucose-Capillary: 123 mg/dL — ABNORMAL HIGH (ref 70–99)
Glucose-Capillary: 134 mg/dL — ABNORMAL HIGH (ref 70–99)
Glucose-Capillary: 89 mg/dL (ref 70–99)
Glucose-Capillary: 171 mg/dL — ABNORMAL HIGH (ref 70–99)

## 2023-03-31 LAB — MAGNESIUM: Magnesium: 1.5 mg/dL — ABNORMAL LOW (ref 1.7–2.4)

## 2023-03-31 LAB — TROPONIN I (HIGH SENSITIVITY): Troponin I (High Sensitivity): 269 ng/L (ref <18)

## 2023-03-31 MED ORDER — MEMANTINE HCL 10 MG PO TABS
20.0000 mg | ORAL_TABLET | Freq: Every evening | ORAL | Status: DC
  Administered 2023-03-31 – 2023-04-02 (×3): 20 mg via ORAL
  Filled 2023-03-31 (×2): qty 2
  Filled 2023-03-31: qty 4

## 2023-03-31 MED ORDER — MAGNESIUM SULFATE 2 GM/50ML IV SOLN
2.0000 g | Freq: Once | INTRAVENOUS | Status: AC
  Administered 2023-03-31: 2 g via INTRAVENOUS
  Filled 2023-03-31: qty 50

## 2023-03-31 MED ORDER — PRAVASTATIN SODIUM 20 MG PO TABS
20.0000 mg | ORAL_TABLET | Freq: Every evening | ORAL | Status: DC
  Administered 2023-03-31 – 2023-04-02 (×3): 20 mg via ORAL
  Filled 2023-03-31 (×3): qty 1

## 2023-03-31 MED ORDER — ACETAMINOPHEN 325 MG PO TABS
650.0000 mg | ORAL_TABLET | ORAL | Status: DC | PRN
  Administered 2023-03-31 – 2023-04-02 (×2): 650 mg via ORAL
  Filled 2023-03-31 (×2): qty 2

## 2023-03-31 MED ORDER — INSULIN ASPART 100 UNIT/ML IJ SOLN
0.0000 [IU] | Freq: Three times a day (TID) | INTRAMUSCULAR | Status: DC
  Administered 2023-03-31 (×2): 2 [IU] via SUBCUTANEOUS
  Administered 2023-04-01 – 2023-04-02 (×3): 3 [IU] via SUBCUTANEOUS
  Administered 2023-04-02: 2 [IU] via SUBCUTANEOUS
  Administered 2023-04-03: 3 [IU] via SUBCUTANEOUS
  Filled 2023-03-31 (×7): qty 1

## 2023-03-31 MED ORDER — SODIUM CHLORIDE 0.9 % IV SOLN: 250.0000 mL | INTRAVENOUS | Status: AC | PRN

## 2023-03-31 MED ORDER — SODIUM CHLORIDE 0.9% FLUSH: 3.0000 mL | INTRAVENOUS | Status: DC | PRN

## 2023-03-31 MED ORDER — ASPIRIN 81 MG PO TBEC
81.0000 mg | DELAYED_RELEASE_TABLET | Freq: Every day | ORAL | Status: DC
  Administered 2023-03-31 – 2023-04-03 (×4): 81 mg via ORAL
  Filled 2023-03-31 (×4): qty 1

## 2023-03-31 MED ORDER — SODIUM CHLORIDE 0.9% FLUSH
3.0000 mL | Freq: Two times a day (BID) | INTRAVENOUS | Status: DC
  Administered 2023-03-31 – 2023-04-03 (×6): 3 mL via INTRAVENOUS

## 2023-03-31 MED ORDER — ONDANSETRON HCL 4 MG/2ML IJ SOLN
4.0000 mg | Freq: Four times a day (QID) | INTRAMUSCULAR | Status: DC | PRN
Start: 2023-03-31 — End: 2023-04-03

## 2023-03-31 MED ORDER — INSULIN ASPART 100 UNIT/ML IJ SOLN
0.0000 [IU] | Freq: Every day | INTRAMUSCULAR | Status: DC
Start: 2023-03-31 — End: 2023-04-03
  Administered 2023-04-01: 2 [IU] via SUBCUTANEOUS
  Filled 2023-03-31 (×2): qty 1

## 2023-03-31 MED ORDER — OXYBUTYNIN CHLORIDE ER 5 MG PO TB24
5.0000 mg | ORAL_TABLET | Freq: Every day | ORAL | Status: DC
  Administered 2023-03-31 – 2023-04-03 (×4): 5 mg via ORAL
  Filled 2023-03-31 (×4): qty 1

## 2023-03-31 MED ORDER — HYDROCHLOROTHIAZIDE 12.5 MG PO TABS: 12.5000 mg | ORAL_TABLET | Freq: Every day | ORAL | Status: DC

## 2023-03-31 MED ORDER — COLCHICINE 0.6 MG PO TABS
0.6000 mg | ORAL_TABLET | Freq: Every day | ORAL | Status: DC
  Administered 2023-03-31 – 2023-04-03 (×4): 0.6 mg via ORAL
  Filled 2023-03-31 (×4): qty 1

## 2023-03-31 MED ORDER — ENOXAPARIN SODIUM 40 MG/0.4ML IJ SOSY
40.0000 mg | PREFILLED_SYRINGE | INTRAMUSCULAR | Status: DC
  Administered 2023-03-31 – 2023-04-01 (×2): 40 mg via SUBCUTANEOUS
  Filled 2023-03-31 (×2): qty 0.4

## 2023-03-31 MED ORDER — ALLOPURINOL 300 MG PO TABS: 300.0000 mg | ORAL_TABLET | Freq: Every day | ORAL | Status: DC

## 2023-03-31 MED ORDER — MECLIZINE HCL 25 MG PO TABS
12.5000 mg | ORAL_TABLET | Freq: Every day | ORAL | Status: DC | PRN
  Administered 2023-03-31: 12.5 mg via ORAL
  Filled 2023-03-31: qty 1

## 2023-03-31 MED ORDER — BENAZEPRIL-HYDROCHLOROTHIAZIDE 20-12.5 MG PO TABS: 1.0000 | ORAL_TABLET | Freq: Every day | ORAL | Status: DC

## 2023-03-31 MED ORDER — FUROSEMIDE 10 MG/ML IJ SOLN
40.0000 mg | Freq: Two times a day (BID) | INTRAMUSCULAR | Status: DC
  Administered 2023-03-31 – 2023-04-01 (×4): 40 mg via INTRAVENOUS
  Filled 2023-03-31 (×4): qty 4

## 2023-03-31 MED ORDER — BENAZEPRIL HCL 20 MG PO TABS
20.0000 mg | ORAL_TABLET | Freq: Every day | ORAL | Status: DC
  Filled 2023-03-31: qty 1

## 2023-03-31 MED ORDER — FUROSEMIDE 10 MG/ML IJ SOLN
40.0000 mg | Freq: Once | INTRAMUSCULAR | Status: AC
  Administered 2023-03-31: 40 mg via INTRAVENOUS
  Filled 2023-03-31: qty 4

## 2023-03-31 MED ORDER — ALLOPURINOL 100 MG PO TABS
100.0000 mg | ORAL_TABLET | Freq: Every day | ORAL | Status: DC
  Administered 2023-03-31 – 2023-04-03 (×4): 100 mg via ORAL
  Filled 2023-03-31 (×4): qty 1

## 2023-03-31 NOTE — Progress Notes (Addendum)
PROGRESS NOTE    [name] [name]  IEP:[id number] DOB: 25-Mar-1936 DOA: 03/30/2023 PCP: Lynnea Ferrier, MD     Brief Narrative:   From admission h and p  This 87 year old female with past medical history of T2DM, HLD, and HFrEF. Most recent echo done 03/30/2022 showed LVEF of 30%.  Patient presents with  complaints dizziness, leg swelling, generalized malaise.  She denies chest pain.  She endorses some mild shortness of breath.  She was brought to the ER.  Assessment & Plan:   Principal Problem:   Acute on chronic systolic heart failure (HCC) Active Problems:   Diabetes (HCC)   Hyperlipidemia   Elevated troponin   Acute on chronic systolic CHF (congestive heart failure) (HCC)   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)   Essential hypertension   H/O: duodenal ulcer   PAD (peripheral artery disease) (HCC)   Personal history of breast cancer   History of CVA (cerebrovascular accident)   # Acute on chronic biventricular heart failure TTE from last year shows EF of 30%, severe RV dysfunction, severe TR. Here bnp markedly elevated, has pleural effusion, LE edema. - continue lasix 40 IV bid - monitor I/os - update TTE  # Troponiemia Without chest pain, no overt ischemic changes on EKG, likely demand though could have significant CAD, last cath appears to have been in 2015 with non-obstructive CAD - mgmt per cardiology, probably needs cath if consistent w/ goals of care  # Pleural effusion, right # Lymphadenopathy, chest Moderate, probably 2/2 chf though with multiple enlarged lymph nodes in the chest as well - will speak w/ IR about thoracentesis - outpt repeat imaging of chest in 3 months per radiology  # Hx CVA - continue home asa, statin - f/u lipids and A1c  # T2DM Glucose appropriate - SSI for now  # HTN BP appropriate - hold home benazepril and hydrochlorothiazide for now while we diurese and institute gdmt  # Gout No exacerbation - home  allopurinol and colchicine  # Debility Resides at [facility], ambulates with a walker - PT consult   DVT prophylaxis: lovenox Code Status: dnr confirmed w/ patient Family Communication: daughter updated telephonically 12/20  Level of care: Telemetry Cardiac Status is: Inpatient Remains inpatient appropriate because: severity of illness    Consultants:  cardiology  Procedures: pending  Antimicrobials:  none    Subjective: Reports feeling fine at rest, no chest pain  Objective: Vitals:   03/31/23 0745 03/31/23 0800 03/31/23 0815 03/31/23 0830  BP:      Pulse: 79 94 84 88  Resp: 16 (!) 23 18 (!) 22  Temp:      TempSrc:      SpO2: 100% 98% 98% 100%  Weight:      Height:       No intake or output data in the 24 hours ending 03/31/23 0840 Filed Weights   03/30/23 1631  Weight: 68.9 kg    Examination:  General exam: Appears calm and comfortable  Respiratory system: Clear to auscultation. Save for rales at bases and decreased breath sounds right base Cardiovascular system: S1 & S2 heard, RRR. Systolic murmur Gastrointestinal system: Abdomen is nondistended, soft and nontender.  Central nervous system: Alert and oriented. No focal neurological deficits. Extremities: Symmetric 5 x 5 power. Pitting edema to knees Skin: No rashes, lesions or ulcers Psychiatry: Judgement and insight appear normal. Mood & affect appropriate.     Data Reviewed: I have personally reviewed following  labs and imaging studies  CBC: Recent Labs  Lab 03/30/23 1635 03/31/23 0503  WBC 6.4 5.7  NEUTROABS  --  4.2  HGB 12.5 12.7  HCT 37.6 37.4  MCV 92.6 91.9  PLT 291 262   Basic Metabolic Panel: Recent Labs  Lab 03/30/23 1635 03/31/23 0503  NA 135  --   K 3.8  --   CL 100  --   CO2 19*  --   GLUCOSE 133*  --   BUN 15  --   CREATININE 1.16*  --   CALCIUM 8.7*  --   MG  --  1.5*   GFR: Estimated Creatinine Clearance: 34.6 mL/min (A) (by C-G formula  based on SCr of 1.16 mg/dL (H)). Liver Function Tests: No results for input(s): "AST", "ALT", "ALKPHOS", "BILITOT", "PROT", "ALBUMIN" in the last 168 hours. No results for input(s): "LIPASE", "AMYLASE" in the last 168 hours. No results for input(s): "AMMONIA" in the last 168 hours. Coagulation Profile: No results for input(s): "INR", "PROTIME" in the last 168 hours. Cardiac Enzymes: No results for input(s): "CKTOTAL", "CKMB", "CKMBINDEX", "TROPONINI" in the last 168 hours. BNP (last 3 results) No results for input(s): "PROBNP" in the last 8760 hours. HbA1C: No results for input(s): "HGBA1C" in the last 72 hours. CBG: Recent Labs  Lab 03/30/23 2111 03/31/23 0241 03/31/23 0753  GLUCAP 101* 104* 123*   Lipid Profile: No results for input(s): "CHOL", "HDL", "LDLCALC", "TRIG", "CHOLHDL", "LDLDIRECT" in the last 72 hours. Thyroid Function Tests: No results for input(s): "TSH", "T4TOTAL", "FREET4", "T3FREE", "THYROIDAB" in the last 72 hours. Anemia Panel: No results for input(s): "VITAMINB12", "FOLATE", "FERRITIN", "TIBC", "IRON", "RETICCTPCT" in the last 72 hours. Urine analysis:    Component Value Date/Time   COLORURINE YELLOW (A) 03/30/2023 2108   APPEARANCEUR CLEAR (A) 03/30/2023 2108   APPEARANCEUR Clear 03/12/2014 1000   LABSPEC 1.017 03/30/2023 2108   LABSPEC 1.018 03/12/2014 1000   PHURINE 5.0 03/30/2023 2108   GLUCOSEU NEGATIVE 03/30/2023 2108   GLUCOSEU Negative 03/12/2014 1000   HGBUR NEGATIVE 03/30/2023 2108   BILIRUBINUR NEGATIVE 03/30/2023 2108   BILIRUBINUR Negative 03/12/2014 1000   KETONESUR 5 (A) 03/30/2023 2108   PROTEINUR >=300 (A) 03/30/2023 2108   NITRITE NEGATIVE 03/30/2023 2108   LEUKOCYTESUR TRACE (A) 03/30/2023 2108   LEUKOCYTESUR Trace 03/12/2014 1000   Sepsis Labs: @LABRCNTIP (procalcitonin:4,lacticidven:4)  )No results found for this or any previous visit (from the past 240 hours).       Radiology Studies: CT Angio Chest PE W/Cm &/Or Wo  Cm Result Date: 03/30/2023 CLINICAL DATA:  High probability pulmonary embolism, leg swelling EXAM: CT ANGIOGRAPHY CHEST WITH CONTRAST TECHNIQUE: Multidetector CT imaging of the chest was performed using the standard protocol during bolus administration of intravenous contrast. Multiplanar CT image reconstructions and MIPs were obtained to evaluate the vascular anatomy. RADIATION DOSE REDUCTION: This exam was performed according to the departmental dose-optimization program which includes automated exposure control, adjustment of the mA and/or kV according to patient size and/or use of iterative reconstruction technique. CONTRAST:  75mL OMNIPAQUE IOHEXOL 350 MG/ML SOLN COMPARISON:  None Available. FINDINGS: Cardiovascular: There is adequate opacification of the pulmonary arterial tree. There is poor enhancement of the right lower lobar pulmonary arterial vasculature which relates to collapse of the right lower lobe pulmonary vascular redistribution. No intraluminal filling defects identified to suggest acute pulmonary embolism. The central pulmonary arteries are normal caliber. Extensive multi-vessel coronary artery calcification is identified. Global cardiac size is enlarged with biventricular enlargement noted. There is  reflux of contrast into the hepatic venous system in keeping with of the some degree of right heart failure. No pericardial effusion. Moderate atherosclerotic calcification within the thoracic aorta. No aortic aneurysm. Mediastinum/Nodes: There is pathologic enlargement of right hilar, right paratracheal, and subcarinal lymph nodes with multiple lymph nodes measuring 18-22 mm in short axis diameter. This appears new since prior examination. Visualized thyroid is unremarkable. Esophagus is unremarkable. Lungs/Pleura: Moderate right pleural effusion is present with compressive atelectasis of the dependent right lung. Superimposed back glass 1 bilaterally likely relates to atelectasis. No  pneumothorax. No pleural effusion left. No central obstructing lesion. Upper Abdomen: No acute abnormality.  Trace ascites Musculoskeletal: No chest wall abnormality. No acute or significant osseous findings. Review of the MIP images confirms the above findings. IMPRESSION: 1. No pulmonary embolism. 2. Moderate right pleural effusion with compressive atelectasis of the dependent right lung. No central obstructing lesion. 3. Cardiomegaly with reflux of contrast into the hepatic venous system in keeping with some degree of right heart failure. 4. Extensive multi-vessel coronary artery calcification. 5. Pathologic enlargement of right hilar, right paratracheal, and subcarinal lymph nodes, new since prior examination. Differential considerations include inflammatory conditions such as sarcoidosis, granulomatous infection, nodal metastatic disease, or lymphoproliferative disorders. Follow-up CT or PET CT examination is recommended 3 months to demonstrate resolution or direct potential biopsy strategy. 6. Trace ascites. Aortic Atherosclerosis (ICD10-I70.0). Electronically Signed   By: Helyn Numbers M.D.   On: 03/30/2023 23:42   DG Chest Port 1 View Result Date: 03/30/2023 CLINICAL DATA:  Dizziness EXAM: PORTABLE CHEST 1 VIEW COMPARISON:  02/01/2016 FINDINGS: Cardiac shadow is enlarged. Aortic calcifications are noted. The lungs are well aerated bilaterally. Small right pleural effusion is seen. No bony abnormality is noted. IMPRESSION: Small right pleural effusion. Electronically Signed   By: Alcide Clever M.D.   On: 03/30/2023 23:09        Scheduled Meds:  aspirin EC  81 mg Oral Daily   benazepril  20 mg Oral Daily   And   hydrochlorothiazide  12.5 mg Oral Daily   enoxaparin (LOVENOX) injection  40 mg Subcutaneous Q24H   furosemide  40 mg Intravenous BID   insulin aspart  0-15 Units Subcutaneous TID WC   insulin aspart  0-5 Units Subcutaneous QHS   memantine  20 mg Oral QPM   oxybutynin  5 mg Oral  Daily   pravastatin  20 mg Oral QPM   sodium chloride flush  3 mL Intravenous Q12H   Continuous Infusions:  sodium chloride     magnesium sulfate bolus IVPB 2 g (03/31/23 0835)     LOS: 0 days     Silvano Bilis, MD [facility]   If 7PM-7AM, please contact night-coverage [email or web address] Password [email or web address] 03/31/2023, 8:40 AM

## 2023-03-31 NOTE — ED Notes (Signed)
Assisted to and from restroom, NADN

## 2023-03-31 NOTE — ED Notes (Signed)
Pt requested room temperature to increase as she was feeling cold, no other needs. Will continue to monitor

## 2023-03-31 NOTE — Consult Note (Addendum)
ADVANCED HEART FAILURE CONSULT NOTE  Referring Physician: No ref. provider found  Primary Care: Lynnea Ferrier, MD   HPI: [name] [name] is a 87 y.o. 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  Unfortunately Ms. Thwaites is unable to provide me with much history at all.  After asking her multiple questions he states that she does not know and is only here due to dizziness.  Reports that she has had no shortness of breath.  Based on chart review, patient was last seen by [facility] cardiology in 2015.  At that time echocardiogram demonstrated EF of 55% and moderate mitral regurgitation.  Most recent echo from 03/30/2022 with severe tricuspid regurgitation and severe RV failure based on care everywhere.  On my evaluation in the emergency department patient mildly volume overloaded.  EKG normal sinus rhythm.  CT chest with moderate-sized right pleural effusion that was not amenable to thoracentesis.  And lab work significant for stable high-sensitivity troponin of 200, serum creatinine 1.1 and normal LFTs.   Past Medical History:  Diagnosis Date   Cancer [facility])    right breast cancer   Diabetes mellitus without complication (HCC)    Hypertension    Stroke (HCC)     Current Facility-Administered Medications  Medication Dose Route Frequency Provider Last Rate Last Admin   0.9 %  sodium chloride infusion  250 mL Intravenous PRN Crosley, Debby, MD       acetaminophen (TYLENOL) tablet 650 mg  650 mg Oral Q4H PRN Joneen Roach, Debby, MD   650 mg at 03/31/23 1314   allopurinol (ZYLOPRIM) tablet 100 mg  100 mg Oral Daily Kathrynn Running, MD   100 mg at 03/31/23 4010   aspirin EC tablet 81 mg  81 mg Oral Daily Crosley, Debby, MD   81 mg at 03/31/23 2725   colchicine tablet 0.6 mg  0.6 mg Oral Daily Kathrynn Running, MD   0.6 mg at 03/31/23 0921   enoxaparin (LOVENOX)  injection 40 mg  40 mg Subcutaneous Q24H Crosley, Debby, MD   40 mg at 03/31/23 0816   furosemide (LASIX) injection 40 mg  40 mg Intravenous BID Gery Pray, MD   40 mg at 03/31/23 0816   insulin aspart (novoLOG) injection 0-15 Units  0-15 Units Subcutaneous TID WC Crosley, Debby, MD   2 Units at 03/31/23 1158   insulin aspart (novoLOG) injection 0-5 Units  0-5 Units Subcutaneous QHS Crosley, Debby, MD       meclizine (ANTIVERT) tablet 12.5 mg  12.5 mg Oral Daily PRN Crosley, Debby, MD       memantine (NAMENDA) tablet 20 mg  20 mg Oral QPM Crosley, Debby, MD       ondansetron (ZOFRAN) injection 4 mg  4 mg Intravenous Q6H PRN Crosley, Debby, MD       oxybutynin (DITROPAN-XL) 24 hr tablet 5 mg  5 mg Oral Daily Crosley, Debby, MD   5 mg at 03/31/23 0921   pravastatin (PRAVACHOL) tablet 20 mg  20 mg Oral QPM Crosley, Debby, MD       sodium chloride flush (NS) 0.9 % injection 3 mL  3 mL Intravenous Q12H Crosley, Debby, MD   3 mL at 03/31/23 0244   sodium chloride flush (NS) 0.9 % injection 3 mL  3 mL Intravenous PRN Gery Pray, MD       Current Outpatient Medications  Medication Sig  Dispense Refill   allopurinol (ZYLOPRIM) 100 MG tablet Take 1 tablet by mouth daily.     aspirin 81 MG tablet Take 81 mg by mouth daily as needed for pain or fever.     benazepril-hydrochlorthiazide (LOTENSIN HCT) 20-12.5 MG tablet Take 1 tablet by mouth daily.  11   colchicine 0.6 MG tablet Take 0.6 mg by mouth daily.     furosemide (LASIX) 20 MG tablet Take 1 tablet (20 mg total) by mouth daily. 30 tablet 0   loperamide (LOPERAMIDE A-D) 2 MG tablet Take 2 mg by mouth as needed for diarrhea or loose stools.     memantine (NAMENDA) 10 MG tablet Take 2 tablets by mouth every evening.   2   metFORMIN (GLUCOPHAGE) 1000 MG tablet Take 1 tablet by mouth 2 (two) times daily. Take 1 tablet in the morning and 1 tablet in the evening  11   naproxen sodium (ALEVE) 220 MG tablet Take 220 mg by mouth daily as needed.      polyethylene glycol (MIRALAX / GLYCOLAX) packet Take 17 g by mouth daily as needed (Constipation). 30 each 0   pravastatin (PRAVACHOL) 20 MG tablet Take 1 tablet by mouth every evening.   5   sodium chloride (OCEAN) 0.65 % SOLN nasal spray Place 1 spray into both nostrils as needed for congestion.     allopurinol (ZYLOPRIM) 300 MG tablet Take 1 tablet (300 mg total) by mouth daily. (Patient not taking: Reported on 03/31/2023) 30 tablet 3   oxybutynin (DITROPAN-XL) 5 MG 24 hr tablet Take 5 mg by mouth daily. (Patient not taking: Reported on 03/31/2023)      Allergies  Allergen Reactions   Codeine Other (See Comments)    "I go crazy"   Etodolac     Other reaction(s): Unknown   Indomethacin     Other reaction(s): Unknown   Penicillins Other (See Comments)    Does not remember this allergy Has patient had a PCN reaction causing immediate rash, facial/tongue/throat swelling, SOB or lightheadedness with hypotension: Unknown Has patient had a PCN reaction causing severe rash involving mucus membranes or skin necrosis: Unknown Has patient had a PCN reaction that required hospitalization: Unknown Has patient had a PCN reaction occurring within the last 10 years: Unknown If all of the above answers are "NO", then may proceed with Cephalosporin use.    Tizanidine     Other reaction(s): Syncope Fell on the floor    Tramadol     Other reaction(s): Unknown      Social History   Socioeconomic History   Marital status: Widowed    Spouse name: Not on file   Number of children: Not on file   Years of education: Not on file   Highest education level: Not on file  Occupational History   Not on file  Tobacco Use   Smoking status: Former    Current packs/day: 0.00    Types: Cigarettes    Quit date: 1990    Years since quitting: 34.9   Smokeless tobacco: Never  Substance and Sexual Activity   Alcohol use: No   Drug use: No   Sexual activity: Not on file  Other Topics Concern   Not on  file  Social History Narrative   Not on file   Social Drivers of Health   Financial Resource Strain: Low Risk  (02/21/2023)   Received from [facility]   Overall Financial Resource Strain (CARDIA)    Difficulty of Paying  Living Expenses: Not hard at all  Food Insecurity: No Food Insecurity (02/21/2023)   Received from [facility]   Hunger Vital Sign    Worried About Running Out of Food in the Last Year: Never true    Ran Out of Food in the Last Year: Never true  Transportation Needs: No Transportation Needs (02/21/2023)   Received from [facility] - Transportation    In the past 12 months, has lack of transportation kept you from medical appointments or from getting medications?: No    Lack of Transportation (Non-Medical): No  Physical Activity: Not on file  Stress: Not on file  Social Connections: Not on file  Intimate Partner Violence: Not on file     History reviewed. No pertinent family history.  PHYSICAL EXAM: Vitals:   03/31/23 1314 03/31/23 1500  BP:  116/81  Pulse:  87  Resp:  20  Temp: 98 F (36.7 C)   SpO2:  99%   GENERAL: Well nourished, well developed, and in no apparent distress at rest.  HEENT: Negative for arcus senilis or xanthelasma. There is no scleral icterus.  The mucous membranes are pink and moist.   NECK: Supple, No masses. Normal carotid upstrokes without bruits. No masses or thyromegaly.    CHEST: There are no chest wall deformities. There is no chest wall tenderness. Respirations are unlabored.  Lungs- CTA B/L CARDIAC:  JVP: 10 cm H2O         Normal S1, S2  Normal rate with regular rhythm. No murmurs, rubs or gallops.  Pulses are 2+ and symmetrical in upper and lower extremities. trace edema.  ABDOMEN: Soft, non-tender, non-distended. There are no masses or hepatomegaly. There are normal bowel sounds.  EXTREMITIES: Warm and well perfused with no cyanosis, clubbing.  LYMPHATIC: No  axillary or supraclavicular lymphadenopathy.  NEUROLOGIC: Patient is oriented x3 with no focal or lateralizing neurologic deficits.  PSYCH: Patients affect is appropriate, there is no evidence of anxiety or depression.  SKIN: Warm and dry; no lesions or wounds.   DATA REVIEW  Serum creatinine 1.1.   Echocardiogram from 12/23 on care everywhere with moderately reduced LV function, severely reduced RV function and severe TR.   LFTs within normal limit.   CTA PE with moderate size right pleural effusion.   Personally reviewed.  ASSESSMENT & PLAN:  RV failure -On exam patient only appears mildly hypervolemic.  She is hemodynamically stable with no signs of low output heart failure. -Based on care everywhere she has severe TR and RV failure.  On exam I do not see any V waves on her JVP and she is fairly comfortable. -Obtain echocardiogram.  Will give IV Lasix 60 mg x 1 today. -Can start digoxin 62.5 mcg daily if she does have RV failure based on echo -Outpatient right heart catheterization if indicated. -Above workup pending  2. Chronic systolic heart failure - TTE pending - She does not appear to be in decompensated heart failure by exam - Will give IV lasix 60mg  x 1 today. Adjust meds based on echocardiogram. Hemodynamically stable today with no signs of low output.  - start low dose GDMT if she does have underlying HFrEF based on TTE  (losartan 12.5mg  daily, spironolactone 12.5mg , metoprolol 12.5mg )  3. Dizziness - Orthostatic vitals WNL - Reports dizziness has improved/resolved during my exam.  - TTE pending; may have lightheadedness secondary to underlying RV failure.    Banesa Tristan Advanced Heart Failure Mechanical Circulatory  Support

## 2023-03-31 NOTE — ED Notes (Signed)
Assisted to and from restroom

## 2023-03-31 NOTE — Plan of Care (Signed)
Patient admitted with acute CHF exacerbation, found to have new right pleural effusion. Request to IR for diagnostic and therapeutic thoracentesis.  Limited US of the left chest shows no pleural fluid. Limited US of the right chest shows very small amount of fluid however the lung intermittently obscures this window with respiration. Given increased risk for pneumothorax, diaphragmatic injury and/or liver injury the procedure was deferred at this time. Above discussed with patient and hospitalist.  Images available for review under imaging section of Epic.  IR remains available as needed - please place an new order for US thoracentesis if patient would benefit from repeat evaluation in the future.  Lynnette Caffey, PA-C

## 2023-03-31 NOTE — ED Notes (Signed)
Pt assisted to commode. Pt reported feeling dizzy

## 2023-03-31 NOTE — Telephone Encounter (Signed)
Pharmacy Patient Advocate Encounter  Insurance verification completed.    The patient is insured through [employer]. Patient has Medicare and is not eligible for a copay card, but may be able to apply for patient assistance, if available.    Ran test claim for Jardiance and the current 30 day co-pay is $150.25.   This test claim was processed through [facility]- copay amounts may vary at other pharmacies due to pharmacy/plan contracts, or as the patient moves through the different stages of their insurance plan.

## 2023-03-31 NOTE — ED Notes (Signed)
Patient ambulatory to the bathroom with one person assist.

## 2023-03-31 NOTE — Telephone Encounter (Signed)
Pharmacy Patient Advocate Encounter  Insurance verification completed.    The patient is insured through [employer].     Ran test claim for Entresto and the current 30 day co-pay is $169.17.   This test claim was processed through [facility]- copay amounts may vary at other pharmacies due to pharmacy/plan contracts, or as the patient moves through the different stages of their insurance plan.

## 2023-03-31 NOTE — ED Notes (Signed)
Patient ambulatory to and from the bathroom in room with one person assist.

## 2023-03-31 NOTE — H&P (Addendum)
PCP:   Lynnea Ferrier, MD   Chief Complaint:  Generalized malaise.  HPI: This 87 year old female with past medical history of T2DM, HLD, and HFrEF. Most recent echo done 03/30/2022 showed LVEF of 30%.  Patient presents with  complaints dizziness, leg swelling, generalized malaise.  She denies chest pain.  She endorses some mild shortness of breath.  She was brought to the ER.  In the ER presenting vitals 108/90, 98, 18, afebrile.  Satting 100% on room air.  BNP 1313.  Troponin 265 => 269.  EKG normal sinus rhythm. Magnesium 1.5.  Potassium 3.8.  CO2 19 CTA chest shows moderate right pleural effusion with compressive atelectasis of the dependent right lung.  Cardiomegaly with reflux of contrast into the hepatic venous system in keeping with some degree of right heart failure.  Patient given IV Lasix in ER.  Admission requested for CHF.   Review of Systems:  Per HPI  Past Medical History: Past Medical History:  Diagnosis Date   Cancer (HCC)    right breast cancer   Diabetes mellitus without complication (HCC)    Hypertension    Stroke [facility])    Past Surgical History:  Procedure Laterality Date   BREAST SURGERY     right lumpectomy   KNEE SURGERY     bilateral    Medications: Prior to Admission medications   Medication Sig Start Date End Date Taking? Authorizing Provider  furosemide (LASIX) 20 MG tablet Take 1 tablet (20 mg total) by mouth daily. 03/30/23 04/29/23 Yes Merwyn Katos, MD  acetaminophen (TYLENOL) 325 MG tablet Take 650 mg by mouth every 6 (six) hours as needed for mild pain.     [provider]  allopurinol (ZYLOPRIM) 300 MG tablet Take 1 tablet (300 mg total) by mouth daily. 12/17/17   Sherrie Mustache Roselyn Bering, PA-C  aspirin 81 MG tablet Take 81 mg by mouth daily.     [provider]  benazepril-hydrochlorthiazide (LOTENSIN HCT) 20-12.5 MG tablet Take 1 tablet by mouth daily. 01/11/16   [provider]  colchicine 0.6 MG tablet Take 1 tablet  (0.6 mg total) by mouth 2 (two) times daily for 7 days. 12/17/17 02/04/19  Fisher, Roselyn Bering, PA-C  HYDROcodone-acetaminophen (NORCO/VICODIN) 5-325 MG tablet Take 1 tablet by mouth every 6 (six) hours as needed for moderate pain. 11/05/20   Fisher, Roselyn Bering, PA-C  linagliptin (TRADJENTA) 5 MG TABS tablet Take 5 mg by mouth daily as needed (high blood sugar).  05/08/17   [provider]  loperamide (LOPERAMIDE A-D) 2 MG tablet Take 2 mg by mouth as needed for diarrhea or loose stools.    [provider]  meclizine (ANTIVERT) 12.5 MG tablet Take 12.5 mg by mouth daily as needed for dizziness.     [provider]  memantine (NAMENDA) 10 MG tablet Take 2 tablets by mouth every evening.  10/26/15   [provider]  metFORMIN (GLUCOPHAGE) 1000 MG tablet Take 1-1.5 tablets by mouth 2 (two) times daily. Take 1 & 1/2 tablet in the morning and 1 tablet in the evening 01/11/16   [provider]  pioglitazone (ACTOS) 45 MG tablet Take 45 mg by mouth daily.  08/29/17   [provider]  polyethylene glycol (MIRALAX / GLYCOLAX) packet Take 17 g by mouth daily as needed (Constipation). 02/04/18   Salary, Jetty Duhamel D, MD  pravastatin (PRAVACHOL) 20 MG tablet Take 1 tablet by mouth every evening.  01/02/16   [provider]  promethazine (  PHENERGAN) 12.5 MG tablet TAKE 1 TABLET BY MOUTH EVERY 6 HOURS AS NEEDED FOR NAUSEA 09/03/14   [provider]  sodium chloride (OCEAN) 0.65 % SOLN nasal spray Place 1 spray into both nostrils as needed for congestion.    [provider]    Allergies:   Allergies  Allergen Reactions   Codeine Other (See Comments)    "I go crazy"   Etodolac     Other reaction(s): Unknown   Indomethacin     Other reaction(s): Unknown   Penicillins Other (See Comments)    Does not remember this allergy Has patient had a PCN reaction causing immediate rash, facial/tongue/throat swelling, SOB or lightheadedness with hypotension:  Unknown Has patient had a PCN reaction causing severe rash involving mucus membranes or skin necrosis: Unknown Has patient had a PCN reaction that required hospitalization: Unknown Has patient had a PCN reaction occurring within the last 10 years: Unknown If all of the above answers are "NO", then may proceed with Cephalosporin use.    Tizanidine     Other reaction(s): Syncope Fell on the floor    Tramadol     Other reaction(s): Unknown    Social History:  reports that she quit smoking about 34 years ago. Her smoking use included cigarettes. She has never used smokeless tobacco. She reports that she does not drink alcohol and does not use drugs.  Family History: History reviewed. No pertinent family history.  Physical Exam: Vitals:   03/30/23 1631 03/30/23 2057 03/30/23 2300 03/31/23 0106  BP:  (!) 151/93 126/79 (!) 142/93  Pulse:  94 87 84  Resp:  16 17 14   Temp:  98.3 F (36.8 C)  98.1 F (36.7 C)  TempSrc:  Oral  Oral  SpO2:  100% 100% 100%  Weight: 68.9 kg     Height: 5\' 5"  (1.651 m)       General: A and O x 3, well developed and nourished, no acute distress Eyes: Pink conjunctiva, no scleral icterus ENT: Moist oral mucosa, neck supple, no thyromegaly Lungs: CTA B/L, no wheeze, no crackles, no use of accessory muscles Cardiovascular: RRR, no regurgitation, no bruits, positive JVD Abdomen: soft, positive BS, NTND, no organomegaly, not an acute abdomen GU: not examined Neuro: CN II - XII grossly intact, sensation intact Musculoskeletal: strength 5/5 all extremities.  2+ B/L LE pitting edema Skin: no rash, no subcutaneous crepitation, no decubitus Psych: appropriate patient  Labs on Admission:  Recent Labs    03/30/23 1635  NA 135  K 3.8  CL 100  CO2 19*  GLUCOSE 133*  BUN 15  CREATININE 1.16*  CALCIUM 8.7*    Recent Labs    03/30/23 1635  WBC 6.4  HGB 12.5  HCT 37.6  MCV 92.6  PLT 291     Radiological Exams on Admission: CT Angio Chest PE W/Cm  &/Or Wo Cm Result Date: 03/30/2023 CLINICAL DATA:  High probability pulmonary embolism, leg swelling EXAM: CT ANGIOGRAPHY CHEST WITH CONTRAST TECHNIQUE: Multidetector CT imaging of the chest was performed using the standard protocol during bolus administration of intravenous contrast. Multiplanar CT image reconstructions and MIPs were obtained to evaluate the vascular anatomy. RADIATION DOSE REDUCTION: This exam was performed according to the departmental dose-optimization program which includes automated exposure control, adjustment of the mA and/or kV according to patient size and/or use of iterative reconstruction technique. CONTRAST:  75mL OMNIPAQUE IOHEXOL 350 MG/ML SOLN COMPARISON:  None Available. FINDINGS: Cardiovascular: There is adequate opacification of the pulmonary  arterial tree. There is poor enhancement of the right lower lobar pulmonary arterial vasculature which relates to collapse of the right lower lobe pulmonary vascular redistribution. No intraluminal filling defects identified to suggest acute pulmonary embolism. The central pulmonary arteries are normal caliber. Extensive multi-vessel coronary artery calcification is identified. Global cardiac size is enlarged with biventricular enlargement noted. There is reflux of contrast into the hepatic venous system in keeping with of the some degree of right heart failure. No pericardial effusion. Moderate atherosclerotic calcification within the thoracic aorta. No aortic aneurysm. Mediastinum/Nodes: There is pathologic enlargement of right hilar, right paratracheal, and subcarinal lymph nodes with multiple lymph nodes measuring 18-22 mm in short axis diameter. This appears new since prior examination. Visualized thyroid is unremarkable. Esophagus is unremarkable. Lungs/Pleura: Moderate right pleural effusion is present with compressive atelectasis of the dependent right lung. Superimposed back glass 1 bilaterally likely relates to atelectasis. No  pneumothorax. No pleural effusion left. No central obstructing lesion. Upper Abdomen: No acute abnormality.  Trace ascites Musculoskeletal: No chest wall abnormality. No acute or significant osseous findings. Review of the MIP images confirms the above findings. IMPRESSION: 1. No pulmonary embolism. 2. Moderate right pleural effusion with compressive atelectasis of the dependent right lung. No central obstructing lesion. 3. Cardiomegaly with reflux of contrast into the hepatic venous system in keeping with some degree of right heart failure. 4. Extensive multi-vessel coronary artery calcification. 5. Pathologic enlargement of right hilar, right paratracheal, and subcarinal lymph nodes, new since prior examination. Differential considerations include inflammatory conditions such as sarcoidosis, granulomatous infection, nodal metastatic disease, or lymphoproliferative disorders. Follow-up CT or PET CT examination is recommended 3 months to demonstrate resolution or direct potential biopsy strategy. 6. Trace ascites. Aortic Atherosclerosis (ICD10-I70.0). Electronically Signed   By: Helyn Numbers M.D.   On: 03/30/2023 23:42   DG Chest Port 1 View Result Date: 03/30/2023 CLINICAL DATA:  Dizziness EXAM: PORTABLE CHEST 1 VIEW COMPARISON:  02/01/2016 FINDINGS: Cardiac shadow is enlarged. Aortic calcifications are noted. The lungs are well aerated bilaterally. Small right pleural effusion is seen. No bony abnormality is noted. IMPRESSION: Small right pleural effusion. Electronically Signed   By: Alcide Clever M.D.   On: 03/30/2023 23:09    Assessment/Plan Present on Admission:  Acute on chronic HFrEF //CAD -CHF order set initiated -IV Lasix twice daily -Daily weights, strict I's and O's -Oxygen if needed. -Continue bisoprolol, HCTZ, aspirin, Pravachol. -Consult placed to heart failure team by EDP.Marland Kitchen   Elevated troponin -Likely secondary to CHF.  Troponin elevated but flat.  Patient without complaints of  chest pains.  EKG without acute ischemic changes. -Defer to cardiology   Hyperlipidemia -Pravachol resumed   T2DM -Sliding scale insulin initiated   Dementia -Namenda resumed   Gout -Allopurinol resumed  [name] [name] 03/31/2023, 1:44 AM

## 2023-03-31 NOTE — Evaluation (Signed)
Physical Therapy Evaluation Patient Details Name: [name] [name] MRN: [id number] DOB: 06-12-1935 Today's Date: 03/31/2023  History of Present Illness  This 87 year old female with past medical history of T2DM, HLD, and HFrEF.  Most recent echo done 03/30/2022 showed LVEF of 30%.  Patient presents with  complaints dizziness, leg swelling, generalized malaise.  She denies chest pain.  She endorses some mild shortness of breath.  She was brought to the ER.   Clinical Impression  Patient received in ED on stretcher. She is agreeable to PT assessment. Patient is mod I with bed mobility. Transfers with supervision from stretcher and commode in room. Patient ambulated 25 feet with RW and supervision. She will continue to benefit from skilled PT to improve strength, endurance and safety.           If plan is discharge home, recommend the following: A little help with walking and/or transfers;A little help with bathing/dressing/bathroom;Assist for transportation;Help with stairs or ramp for entrance   Can travel by private vehicle    yes    Equipment Recommendations None recommended by PT  Recommendations for Other Services       Functional Status Assessment Patient has had a recent decline in their functional status and demonstrates the ability to make significant improvements in function in a reasonable and predictable amount of time.     Precautions / Restrictions Precautions Precautions: Fall Precaution Comments: no recent falls per her report Restrictions Weight Bearing Restrictions Per Provider Order: No      Mobility  Bed Mobility Overal bed mobility: Modified Independent             General bed mobility comments: increased time    Transfers Overall transfer level: Needs assistance Equipment used: Rolling walker (2 wheels) Transfers: Sit to/from Stand Sit to Stand: Supervision           General transfer comment: patient needs supervision from high stretcher     Ambulation/Gait Ambulation/Gait assistance: Supervision Gait Distance (Feet): 25 Feet Assistive device: Rolling walker (2 wheels) Gait Pattern/deviations: Step-through pattern Gait velocity: decr     General Gait Details: no lob, ambulating well with supervision  Stairs            Wheelchair Mobility     Tilt Bed    Modified Rankin (Stroke Patients Only)       Balance Overall balance assessment: Modified Independent                                           Pertinent Vitals/Pain Pain Assessment Pain Assessment: No/denies pain    Home Living Family/patient expects to be discharged to:: Other (Comment)                   Additional Comments: Lives at [facility] independent living    Prior Function Prior Level of Function : Independent/Modified Independent             Mobility Comments: uses walker ADLs Comments: no A, gets one meal provided by facility     Extremity/Trunk Assessment   Upper Extremity Assessment Upper Extremity Assessment: Overall WFL for tasks assessed    Lower Extremity Assessment Lower Extremity Assessment: Overall WFL for tasks assessed    Cervical / Trunk Assessment Cervical / Trunk Assessment: Normal  Communication   Communication Communication: No apparent difficulties Cueing Techniques: Verbal cues  Cognition Arousal: Alert Behavior During Therapy:  WFL for tasks assessed/performed Overall Cognitive Status: Within Functional Limits for tasks assessed                                          General Comments      Exercises     Assessment/Plan    PT Assessment Patient needs continued PT services  PT Problem List Decreased strength;Decreased activity tolerance;Decreased mobility       PT Treatment Interventions DME instruction;Gait training;Functional mobility training;Therapeutic activities;Therapeutic exercise;Balance training;Neuromuscular  re-education;Patient/family education    PT Goals (Current goals can be found in the Care Plan section)  Acute Rehab PT Goals Patient Stated Goal: to get stronger PT Goal Formulation: With patient Time For Goal Achievement: 04/07/23 Potential to Achieve Goals: Good    Frequency Min 1X/week     Co-evaluation               AM-PAC PT "6 Clicks" Mobility  Outcome Measure Help needed turning from your back to your side while in a flat bed without using bedrails?: None Help needed moving from lying on your back to sitting on the side of a flat bed without using bedrails?: None Help needed moving to and from a bed to a chair (including a wheelchair)?: A Little Help needed standing up from a chair using your arms (e.g., wheelchair or bedside chair)?: A Little Help needed to walk in hospital room?: A Little Help needed climbing 3-5 steps with a railing? : A Little 6 Click Score: 20    End of Session   Activity Tolerance: Patient tolerated treatment well Patient left: in bed;with call bell/phone within reach;with family/visitor present Nurse Communication: Mobility status PT Visit Diagnosis: Muscle weakness (generalized) (M62.81);Other abnormalities of gait and mobility (R26.89)    Time: 1027-2536 PT Time Calculation (min) (ACUTE ONLY): 16 min   Charges:   PT Evaluation $PT Eval Low Complexity: 1 Low   PT General Charges $$ ACUTE PT VISIT: 1 Visit         Amadeo Coke, PT, GCS 03/31/23,12:51 PM

## 2023-03-31 NOTE — ED Notes (Signed)
Patient complains of a headache 5/10

## 2023-03-31 NOTE — ED Notes (Signed)
Patient ambulatory to bathroom in room with one person assist.

## 2023-03-31 NOTE — ED Provider Notes (Signed)
Is a patient with evidence of heart failure, CT angiogram shows no PE, oxygenation is normal.  Elevated BNP and serial troponins have been flat although elevated.  She adamantly denies any chest pain or discomfort whatsoever and has no shortness of breath.  Daughter at bedside says that patient has dementia.  She has no establish care with cardiologist into the best of her knowledge has no history of congestive heart failure and does not take diuretic.  Given these findings, patient and daughter are concerned with new diagnosis of potential CHF exacerbation and would like to be admitted for diuresis and to ensure proper follow-up with cardiologist.  Hospitalist called for admission.   Pilar Jarvis, MD 03/31/23 [phone]

## 2023-04-01 ENCOUNTER — Inpatient Hospital Stay (HOSPITAL_COMMUNITY)
Admit: 2023-04-01 | Discharge: 2023-04-01 | Disposition: A | Discharge status: Home / Self Care | Payer: Medicare Other | Attending: Obstetrics and Gynecology | Admitting: Obstetrics and Gynecology

## 2023-04-01 DIAGNOSIS — I5023 Acute on chronic systolic (congestive) heart failure: Secondary | ICD-10-CM

## 2023-04-01 DIAGNOSIS — R7989 Other specified abnormal findings of blood chemistry: Secondary | ICD-10-CM

## 2023-04-01 DIAGNOSIS — R42 Dizziness and giddiness: Secondary | ICD-10-CM | POA: Diagnosis not present

## 2023-04-01 DIAGNOSIS — R600 Localized edema: Secondary | ICD-10-CM

## 2023-04-01 DIAGNOSIS — I50810 Right heart failure, unspecified: Secondary | ICD-10-CM

## 2023-04-01 LAB — ECHOCARDIOGRAM COMPLETE
AR max vel: 1.14 cm2
AV Peak grad: 8 mm[Hg]
Ao pk vel: 1.41 m/s
Area-P 1/2: 5.02 cm2
Calc EF: 31 %
Height: 65 in
S' Lateral: 3.4 cm
Single Plane A2C EF: 36 %
Single Plane A4C EF: 24.1 %
Weight: 2432 [oz_av]

## 2023-04-01 LAB — CBG MONITORING, ED
Glucose-Capillary: 101 mg/dL — ABNORMAL HIGH (ref 70–99)
Glucose-Capillary: 181 mg/dL — ABNORMAL HIGH (ref 70–99)

## 2023-04-01 LAB — MAGNESIUM: Magnesium: 1.9 mg/dL (ref 1.7–2.4)

## 2023-04-01 LAB — BASIC METABOLIC PANEL
Anion gap: 13 (ref 5–15)
BUN: 16 mg/dL (ref 8–23)
CO2: 28 mmol/L (ref 22–32)
Calcium: 8.7 mg/dL — ABNORMAL LOW (ref 8.9–10.3)
Chloride: 97 mmol/L — ABNORMAL LOW (ref 98–111)
Creatinine, Ser: 1.15 mg/dL — ABNORMAL HIGH (ref 0.44–1.00)
GFR, Estimated: 47 mL/min — ABNORMAL LOW (ref >60)
Glucose, Bld: 93 mg/dL (ref 70–99)
Potassium: 2.8 mmol/L — ABNORMAL LOW (ref 3.5–5.1)
Sodium: 138 mmol/L (ref 135–145)

## 2023-04-01 LAB — GLUCOSE, CAPILLARY
Glucose-Capillary: 87 mg/dL (ref 70–99)
Glucose-Capillary: 202 mg/dL — ABNORMAL HIGH (ref 70–99)

## 2023-04-01 MED ORDER — POTASSIUM CHLORIDE CRYS ER 20 MEQ PO TBCR
40.0000 meq | EXTENDED_RELEASE_TABLET | Freq: Once | ORAL | Status: AC
  Administered 2023-04-01: 40 meq via ORAL
  Filled 2023-04-01: qty 2

## 2023-04-01 MED ORDER — LOSARTAN POTASSIUM 25 MG PO TABS
12.5000 mg | ORAL_TABLET | Freq: Every day | ORAL | Status: DC
  Administered 2023-04-01 – 2023-04-03 (×3): 12.5 mg via ORAL
  Filled 2023-04-01 (×3): qty 1

## 2023-04-01 MED ORDER — POTASSIUM CHLORIDE CRYS ER 20 MEQ PO TBCR
20.0000 meq | EXTENDED_RELEASE_TABLET | Freq: Once | ORAL | Status: AC
  Administered 2023-04-01: 20 meq via ORAL
  Filled 2023-04-01: qty 1

## 2023-04-01 MED ORDER — POTASSIUM CHLORIDE CRYS ER 20 MEQ PO TBCR: 60.0000 meq | EXTENDED_RELEASE_TABLET | Freq: Once | ORAL | Status: DC

## 2023-04-01 MED ORDER — POTASSIUM CHLORIDE 10 MEQ/100ML IV SOLN
10.0000 meq | INTRAVENOUS | Status: DC
  Administered 2023-04-01: 10 meq via INTRAVENOUS
  Filled 2023-04-01 (×2): qty 100

## 2023-04-01 MED ORDER — SPIRONOLACTONE 12.5 MG HALF TABLET
12.5000 mg | ORAL_TABLET | Freq: Every day | ORAL | Status: DC
  Administered 2023-04-01 – 2023-04-03 (×3): 12.5 mg via ORAL
  Filled 2023-04-01 (×3): qty 1

## 2023-04-01 NOTE — Progress Notes (Signed)
Progress Note  Patient Name: [name] [name] Date of Encounter: 04/01/2023  Primary Cardiologist: New  Subjective   No chest pain or dyspnea. Lower extremity swelling and dizziness improved. Intake/output not accurate. Renal function stable.   Inpatient Medications    Scheduled Meds:  allopurinol  100 mg Oral Daily   aspirin EC  81 mg Oral Daily   colchicine  0.6 mg Oral Daily   enoxaparin (LOVENOX) injection  40 mg Subcutaneous Q24H   furosemide  40 mg Intravenous BID   insulin aspart  0-15 Units Subcutaneous TID WC   insulin aspart  0-5 Units Subcutaneous QHS   memantine  20 mg Oral QPM   oxybutynin  5 mg Oral Daily   potassium chloride  20 mEq Oral Once   pravastatin  20 mg Oral QPM   sodium chloride flush  3 mL Intravenous Q12H   Continuous Infusions:  PRN Meds: acetaminophen, meclizine, ondansetron (ZOFRAN) IV, sodium chloride flush   Vital Signs    Vitals:   04/01/23 0630 04/01/23 0700 04/01/23 0722 04/01/23 0730  BP: 113/77 123/78  121/82  Pulse: 78 79  78  Resp: 16 20  18   Temp:   (!) 97.4 F (36.3 C)   TempSrc:   Oral   SpO2: 98% 99%  100%  Weight:      Height:        Intake/Output Summary (Last 24 hours) at 04/01/2023 0913 Last data filed at 04/01/2023 0753 Gross per 24 hour  Intake 84.35 ml  Output --  Net 84.35 ml   Filed Weights   03/30/23 1631  Weight: 68.9 kg    Telemetry    SR with short atrial run and PVCs - Personally Reviewed  ECG    No new tracings - Personally Reviewed  Physical Exam   GEN: No acute distress.   Neck: No JVD. Cardiac: RRR, no murmurs, rubs, or gallops.  Respiratory: Mildly diminished along the right base.  GI: Soft, nontender, non-distended.   MS: No edema; No deformity. Neuro:  Alert and oriented x 3; Nonfocal.  Psych: Normal affect.  Labs    Chemistry Recent Labs  Lab 03/30/23 1635 03/31/23 0503 03/31/23 0512 04/01/23 0418  NA 135 139  --  138  K 3.8 3.4*  --  2.8*  CL 100 99  --  97*   CO2 19* 23  --  28  GLUCOSE 133* 119*  --  93  BUN 15 13  --  16  CREATININE 1.16* 1.13*  --  1.15*  CALCIUM 8.7* 8.8*  --  8.7*  PROT  --   --  6.9  --   ALBUMIN  --   --  3.6  --   AST  --   --  28  --   ALT  --   --  14  --   ALKPHOS  --   --  67  --   BILITOT  --   --  1.3*  --   GFRNONAA 46* 48*  --  47*  ANIONGAP 16* 17*  --  13     Hematology Recent Labs  Lab 03/30/23 1635 03/31/23 0503  WBC 6.4 5.7  RBC 4.06 4.07  HGB 12.5 12.7  HCT 37.6 37.4  MCV 92.6 91.9  MCH 30.8 31.2  MCHC 33.2 34.0  RDW 14.5 14.5  PLT 291 262    Cardiac EnzymesNo results for input(s): "TROPONINI" in the last 168 hours. No results for input(s): "TROPIPOC" in  the last 168 hours.   BNP Recent Labs  Lab 03/30/23 2108  BNP 1,313.4*     DDimer No results for input(s): "DDIMER" in the last 168 hours.   Radiology    [place] CHEST (PLEURAL EFFUSION) Result Date: 03/31/2023 IMPRESSION: Minimal right pleural effusion with no safe window for thoracentesis. No procedure was performed. Electronically Signed   By: Acquanetta Belling M.D.   On: 03/31/2023 11:09   CT Angio Chest PE W/Cm &/Or Wo Cm Result Date: 03/30/2023 IMPRESSION: 1. No pulmonary embolism. 2. Moderate right pleural effusion with compressive atelectasis of the dependent right lung. No central obstructing lesion. 3. Cardiomegaly with reflux of contrast into the hepatic venous system in keeping with some degree of right heart failure. 4. Extensive multi-vessel coronary artery calcification. 5. Pathologic enlargement of right hilar, right paratracheal, and subcarinal lymph nodes, new since prior examination. Differential considerations include inflammatory conditions such as sarcoidosis, granulomatous infection, nodal metastatic disease, or lymphoproliferative disorders. Follow-up CT or PET CT examination is recommended 3 months to demonstrate resolution or direct potential biopsy strategy. 6. Trace ascites. Aortic Atherosclerosis (ICD10-I70.0).  Electronically Signed   By: Helyn Numbers M.D.   On: 03/30/2023 23:42   DG Chest Port 1 View Result Date: 03/30/2023 IMPRESSION: Small right pleural effusion. Electronically Signed   By: Alcide Clever M.D.   On: 03/30/2023 23:09    Cardiac Studies   2D echo 03/30/2022 Gavin Potters): AORTIC ROOT                   Size: Normal             Dissection: INDETERM FOR DISSECTION  AORTIC VALVE               Leaflets: Tricuspid                   Morphology: MILDLY THICKENED               Mobility: Fully mobile  LEFT VENTRICLE                   Size: Normal                        Anterior: HYPOCONTRACTILE            Contraction: MOD GLOBAL DECREASE             Septal: HYPOCONTRACTILE             Closest EF: 30% (Estimated)                 Apical: HYPOCONTRACTILE              LV Masses: No Masses                     Inferior: HYPOCONTRACTILE                    LVH: MILD LVH                     Posterior: HYPOCONTRACTILE           Dias.FxClass: (Grade 1) relaxation abnormal, E/A reversal  MITRAL VALVE               Leaflets: Normal                        Mobility: Fully mobile  Morphology: Normal  LEFT ATRIUM                   Size: SEVERELY ENLARGED            LA Masses: No masses              IA Septum: Normal IAS  MAIN PA                   Size: Normal  PULMONIC VALVE             Morphology: Normal                        Mobility: Fully mobile  RIGHT VENTRICLE              RV Masses: No Masses                         Size: Normal              Free Wall: Normal                     Contraction: SEVERE GLOBAL DECREASE  TRICUSPID VALVE               Leaflets: Normal                        Mobility: Fully mobile             Morphology: Normal  RIGHT ATRIUM               RA Other: None                           RA Mass: No masses                   Size: MODERATELY ENLARGED  PERICARDIUM                 Fluid: No effusion  INFERIOR VENACAVA                   Size: Normal Normal  respiratory collapse  _________________________________________________________________________________________   DOPPLER ECHO and OTHER SPECIAL PROCEDURES                 Aortic: TRIVIAL AR                 No AS                 Mitral: MILD MR                    No MS                         MV Inflow E Vel = 98.5 cm/sec     MV Annulus E'Vel = 7.1 cm/sec                         E/E'Ratio = 13.9              Tricuspid: SEVERE TR                  No TS  385.0 cm/sec peak TR vel   62.3 mmHg peak RV pressure              Pulmonary: TRIVIAL PR                 No PS  _________________________________________________________________________________________  INTERPRETATION  MODERATE LV SYSTOLIC DYSFUNCTION (See above)  SEVERE RV SYSTOLIC DYSFUNCTION (See above)  SEVERE VALVULAR REGURGITATION (See above)  NO VALVULAR STENOSIS  Severely elevated pulmonary pressures with severe TR   Patient Profile     87 y.o. female with history of CAD noted on CT imaging, HFrEF with possible RV failure, severe tricuspid regurgitation, DM2, and HLD admitted with dizziness and fatigue who we are seeing for HFrEF and elevated troponin.   Assessment & Plan    1. HFrEF with RV failure with right pleural effusion: -Evaluated by advanced heart failure team on 12/20, mildly volume up at that time -Prior echo, through [place] PCP, read as severe TR and RV failure, though there was no evidence of V waves or JVP on exam  -Echo pending, escalate GDMT as able/tolerated pending results -Minimal right pleural effusion noted by ultrasound without safe window for thoracentesis  -Intake/output not accurate  -IV Lasix 40 mg bid with monitoring of renal function  -Consider outpatient RHC  2. CAD involving the native coronary arteries with elevated high sensitivity troponin: -Never with chest pain or dyspnea -High sensitivity troponin trended to 269, possibly in the setting of supply demand ischemia  with hypervolemia  -CT imaging this admission notable for extensive multi-vessel coronary artery calcification  -After detailed discussion of cardiac cath, she reports she would not want LHC, she would consider RHC for the above -Since she does not want LHC moving forward, there is really no indication to pursue noninvasive ischemic testing either  -ASA and statin   3. Severe TR: -Await echo  4. Hypokalemia: -Replete to goal 4.0, ordered  -Magnesium normal      For questions or updates, please contact [facility] Please consult [email or web address] for contact info under Cardiology/STEMI.    Signed, Eula Listen, PA-C [facility] Pager: [phone] 04/01/2023, 9:13 AM

## 2023-04-01 NOTE — ED Notes (Signed)
Reached out to MD [facility] via secure chat about problems with runs of potassium burning.

## 2023-04-01 NOTE — Progress Notes (Signed)
PROGRESS NOTE    [name] [name]  YNW:[id number] DOB: October 30, 1935 DOA: 03/30/2023 PCP: Lynnea Ferrier, MD     Brief Narrative:   From admission h and p  This 87 year old female with past medical history of T2DM, HLD, and HFrEF. Most recent echo done 03/30/2022 showed LVEF of 30%.  Patient presents with  complaints dizziness, leg swelling, generalized malaise.  She denies chest pain.  She endorses some mild shortness of breath.  She was brought to the ER.  Assessment & Plan:   Principal Problem:   Acute on chronic systolic heart failure (HCC) Active Problems:   Diabetes (HCC)   Hyperlipidemia   Elevated troponin   Acute on chronic systolic CHF (congestive heart failure) (HCC)   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)   Essential hypertension   H/O: duodenal ulcer   PAD (peripheral artery disease) (HCC)   Personal history of breast cancer   History of CVA (cerebrovascular accident)   # Acute on chronic biventricular heart failure TTE from last year shows EF of 30%, severe RV dysfunction, severe TR. Here bnp markedly elevated, has pleural effusion, LE edema. - continue lasix   - monitor I/os - update TTE, results pending - cardiology considering interval RHC - gdmt per cardiology  # Troponiemia # CAD Without chest pain, no overt ischemic changes on EKG, likely demand though could have significant CAD, last cath appears to have been in 2015 with non-obstructive CAD. Cardiology discussed LHC with patient today, she declines. - continue asa, statin  # Pleural effusion, right # Lymphadenopathy, chest Moderate on cxr, probably 2/2 chf though with multiple enlarged lymph nodes in the chest as well. IR consulted, u/s shows small effusion too small to tap - diuresis as above - outpt repeat imaging of chest in 3 months per radiology  # Hx CVA - continue home asa, statin - f/u lipids   # T2DM Glucose appropriate. A1c 6.4 - SSI for now  # HTN BP  appropriate - hold home benazepril and hydrochlorothiazide for now while we diurese and institute gdmt  # Gout No exacerbation - home allopurinol and colchicine  # Debility Resides at [facility], ambulates with a walker - PT consulted, advises HH PT   DVT prophylaxis: lovenox Code Status: dnr confirmed w/ patient Family Communication: daughter updated telephonically 12/21  Level of care: Telemetry Cardiac Status is: Inpatient Remains inpatient appropriate because: severity of illness    Consultants:  cardiology  Procedures: pending  Antimicrobials:  none    Subjective: Reports feeling fine, tolerating diet, no chest pain  Objective: Vitals:   04/01/23 1155 04/01/23 1200 04/01/23 1230 04/01/23 1300  BP:  112/72 116/67 110/71  Pulse:  76 82 83  Resp:  11 18 18   Temp: 97.9 F (36.6 C)     TempSrc: Oral     SpO2:  100% 98% 100%  Weight:      Height:        Intake/Output Summary (Last 24 hours) at 04/01/2023 1402 Last data filed at 04/01/2023 0753 Gross per 24 hour  Intake 84.35 ml  Output --  Net 84.35 ml   Filed Weights   03/30/23 1631  Weight: 68.9 kg    Examination:  General exam: Appears calm and comfortable  Respiratory system: Clear to auscultation. Save for rales at bases and decreased breath sounds right base Cardiovascular system: S1 & S2 heard, RRR. Systolic murmur Gastrointestinal system: Abdomen is nondistended, soft and nontender.  Central nervous  system: Alert and oriented. No focal neurological deficits. Extremities: Symmetric 5 x 5 power. Trace pitting edema LEs Skin: No rashes, lesions or ulcers Psychiatry: Judgement and insight appear normal. Mood & affect appropriate.     Data Reviewed: I have personally reviewed following labs and imaging studies  CBC: Recent Labs  Lab 03/30/23 1635 03/31/23 0503  WBC 6.4 5.7  NEUTROABS  --  4.2  HGB 12.5 12.7  HCT 37.6 37.4  MCV 92.6 91.9  PLT 291 262   Basic  Metabolic Panel: Recent Labs  Lab 03/30/23 1635 03/31/23 0503 04/01/23 0418  NA 135 139 138  K 3.8 3.4* 2.8*  CL 100 99 97*  CO2 19* 23 28  GLUCOSE 133* 119* 93  BUN 15 13 16   CREATININE 1.16* 1.13* 1.15*  CALCIUM 8.7* 8.8* 8.7*  MG  --  1.5* 1.9   GFR: Estimated Creatinine Clearance: 34.9 mL/min (A) (by C-G formula based on SCr of 1.15 mg/dL (H)). Liver Function Tests: Recent Labs  Lab 03/31/23 0512  AST 28  ALT 14  ALKPHOS 67  BILITOT 1.3*  PROT 6.9  ALBUMIN 3.6   No results for input(s): "LIPASE", "AMYLASE" in the last 168 hours. No results for input(s): "AMMONIA" in the last 168 hours. Coagulation Profile: No results for input(s): "INR", "PROTIME" in the last 168 hours. Cardiac Enzymes: No results for input(s): "CKTOTAL", "CKMB", "CKMBINDEX", "TROPONINI" in the last 168 hours. BNP (last 3 results) No results for input(s): "PROBNP" in the last 8760 hours. HbA1C: Recent Labs    03/31/23 0503  HGBA1C 6.4*   CBG: Recent Labs  Lab 03/31/23 1139 03/31/23 1555 03/31/23 2158 04/01/23 0715 04/01/23 1135  GLUCAP 134* 89 171* 101* 181*   Lipid Profile: No results for input(s): "CHOL", "HDL", "LDLCALC", "TRIG", "CHOLHDL", "LDLDIRECT" in the last 72 hours. Thyroid Function Tests: No results for input(s): "TSH", "T4TOTAL", "FREET4", "T3FREE", "THYROIDAB" in the last 72 hours. Anemia Panel: No results for input(s): "VITAMINB12", "FOLATE", "FERRITIN", "TIBC", "IRON", "RETICCTPCT" in the last 72 hours. Urine analysis:    Component Value Date/Time   COLORURINE YELLOW (A) 03/30/2023 2108   APPEARANCEUR CLEAR (A) 03/30/2023 2108   APPEARANCEUR Clear 03/12/2014 1000   LABSPEC 1.017 03/30/2023 2108   LABSPEC 1.018 03/12/2014 1000   PHURINE 5.0 03/30/2023 2108   GLUCOSEU NEGATIVE 03/30/2023 2108   GLUCOSEU Negative 03/12/2014 1000   HGBUR NEGATIVE 03/30/2023 2108   BILIRUBINUR NEGATIVE 03/30/2023 2108   BILIRUBINUR Negative 03/12/2014 1000   KETONESUR 5 (A)  03/30/2023 2108   PROTEINUR >=300 (A) 03/30/2023 2108   NITRITE NEGATIVE 03/30/2023 2108   LEUKOCYTESUR TRACE (A) 03/30/2023 2108   LEUKOCYTESUR Trace 03/12/2014 1000   Sepsis Labs: @LABRCNTIP (procalcitonin:4,lacticidven:4)  )No results found for this or any previous visit (from the past 240 hours).       Radiology Studies: [place] CHEST (PLEURAL EFFUSION) Result Date: 03/31/2023 CLINICAL DATA:  Patient admitted with acute CHF exacerbation with new right pleural effusion. Request for diagnostic and therapeutic thoracentesis. EXAM: CHEST ULTRASOUND COMPARISON:  CXR 03/30/23, CTA chest 03/30/23 FINDINGS: Examination of the left chest shows no pleural fluid. Examination of the right chest shows minimal pleural effusion which is intermittently obscured by lung during respiration. No safe window for thoracentesis. IMPRESSION: Minimal right pleural effusion with no safe window for thoracentesis. No procedure was performed. Electronically Signed   By: Acquanetta Belling M.D.   On: 03/31/2023 11:09   CT Angio Chest PE W/Cm &/Or Wo Cm Result Date: 03/30/2023 CLINICAL DATA:  High  probability pulmonary embolism, leg swelling EXAM: CT ANGIOGRAPHY CHEST WITH CONTRAST TECHNIQUE: Multidetector CT imaging of the chest was performed using the standard protocol during bolus administration of intravenous contrast. Multiplanar CT image reconstructions and MIPs were obtained to evaluate the vascular anatomy. RADIATION DOSE REDUCTION: This exam was performed according to the departmental dose-optimization program which includes automated exposure control, adjustment of the mA and/or kV according to patient size and/or use of iterative reconstruction technique. CONTRAST:  75mL OMNIPAQUE IOHEXOL 350 MG/ML SOLN COMPARISON:  None Available. FINDINGS: Cardiovascular: There is adequate opacification of the pulmonary arterial tree. There is poor enhancement of the right lower lobar pulmonary arterial vasculature which relates to  collapse of the right lower lobe pulmonary vascular redistribution. No intraluminal filling defects identified to suggest acute pulmonary embolism. The central pulmonary arteries are normal caliber. Extensive multi-vessel coronary artery calcification is identified. Global cardiac size is enlarged with biventricular enlargement noted. There is reflux of contrast into the hepatic venous system in keeping with of the some degree of right heart failure. No pericardial effusion. Moderate atherosclerotic calcification within the thoracic aorta. No aortic aneurysm. Mediastinum/Nodes: There is pathologic enlargement of right hilar, right paratracheal, and subcarinal lymph nodes with multiple lymph nodes measuring 18-22 mm in short axis diameter. This appears new since prior examination. Visualized thyroid is unremarkable. Esophagus is unremarkable. Lungs/Pleura: Moderate right pleural effusion is present with compressive atelectasis of the dependent right lung. Superimposed back glass 1 bilaterally likely relates to atelectasis. No pneumothorax. No pleural effusion left. No central obstructing lesion. Upper Abdomen: No acute abnormality.  Trace ascites Musculoskeletal: No chest wall abnormality. No acute or significant osseous findings. Review of the MIP images confirms the above findings. IMPRESSION: 1. No pulmonary embolism. 2. Moderate right pleural effusion with compressive atelectasis of the dependent right lung. No central obstructing lesion. 3. Cardiomegaly with reflux of contrast into the hepatic venous system in keeping with some degree of right heart failure. 4. Extensive multi-vessel coronary artery calcification. 5. Pathologic enlargement of right hilar, right paratracheal, and subcarinal lymph nodes, new since prior examination. Differential considerations include inflammatory conditions such as sarcoidosis, granulomatous infection, nodal metastatic disease, or lymphoproliferative disorders. Follow-up CT or  PET CT examination is recommended 3 months to demonstrate resolution or direct potential biopsy strategy. 6. Trace ascites. Aortic Atherosclerosis (ICD10-I70.0). Electronically Signed   By: Helyn Numbers M.D.   On: 03/30/2023 23:42   DG Chest Port 1 View Result Date: 03/30/2023 CLINICAL DATA:  Dizziness EXAM: PORTABLE CHEST 1 VIEW COMPARISON:  02/01/2016 FINDINGS: Cardiac shadow is enlarged. Aortic calcifications are noted. The lungs are well aerated bilaterally. Small right pleural effusion is seen. No bony abnormality is noted. IMPRESSION: Small right pleural effusion. Electronically Signed   By: Alcide Clever M.D.   On: 03/30/2023 23:09        Scheduled Meds:  allopurinol  100 mg Oral Daily   aspirin EC  81 mg Oral Daily   colchicine  0.6 mg Oral Daily   enoxaparin (LOVENOX) injection  40 mg Subcutaneous Q24H   furosemide  40 mg Intravenous BID   insulin aspart  0-15 Units Subcutaneous TID WC   insulin aspart  0-5 Units Subcutaneous QHS   memantine  20 mg Oral QPM   oxybutynin  5 mg Oral Daily   pravastatin  20 mg Oral QPM   sodium chloride flush  3 mL Intravenous Q12H   Continuous Infusions:     LOS: 1 day     Silvano Bilis,  MD [facility]   If 7PM-7AM, please contact night-coverage [email or web address] Password [email or web address] 04/01/2023, 2:02 PM

## 2023-04-01 NOTE — ED Notes (Signed)
Patient c/o burning at IV site while potassium is running.  No signs of infiltration, arm warm and dry, no redness noted, blood return noted in j-tube.  Patient requested IV to be removed and another one placed.

## 2023-04-01 NOTE — ED Notes (Signed)
ED TO INPATIENT HANDOFF REPORT  ED Nurse Name and Phone #: Jerilee Field RN [phone]  S Name/Age/Gender [name] [name] 87 y.o. female Room/Bed: ED25A/ED25A  Code Status   Code Status: Limited: Do not attempt resuscitation (DNR) -DNR-LIMITED -Do Not Intubate/DNI   Home/SNF/Other Skilled nursing facility Patient oriented to: self, place, time, and situation Is this baseline? Yes   Triage Complete: Triage complete  Chief Complaint Acute on chronic systolic heart failure [facility]) [I50.23]  Triage Note First Nurse Note;  Pt via [facility] from [facility]. Pt c/o weakness for the past week and bilateral feet swelling and dizziness. Pt is A&Ox4 and NAD 161/102 BP  101 HR  97.8 orally  186 CBG with hx of DM  Pt has a hx of stroke  Patient states dizziness and bilateral leg swelling for one week.   Allergies Allergies  Allergen Reactions   Codeine Other (See Comments)    "I go crazy"   Etodolac     Other reaction(s): Unknown   Indomethacin     Other reaction(s): Unknown   Penicillins Other (See Comments)    Does not remember this allergy Has patient had a PCN reaction causing immediate rash, facial/tongue/throat swelling, SOB or lightheadedness with hypotension: Unknown Has patient had a PCN reaction causing severe rash involving mucus membranes or skin necrosis: Unknown Has patient had a PCN reaction that required hospitalization: Unknown Has patient had a PCN reaction occurring within the last 10 years: Unknown If all of the above answers are "NO", then may proceed with Cephalosporin use.    Tizanidine     Other reaction(s): Syncope Fell on the floor    Tramadol     Other reaction(s): Unknown    Level of Care/Admitting Diagnosis ED Disposition     ED Disposition  Admit   Condition  --   Comment  Hospital Area: [facility] [[id number]]  Level of Care: Telemetry Cardiac [[id number]]  Covid Evaluation: Asymptomatic - no recent exposure (last 10 days) testing not  required  Diagnosis: Acute on chronic systolic heart failure (HCC) [428.23.ICD-9-CM]  Admitting Physician: Gery Pray [[id number]]  Attending Physician: Gery Pray [phone]  Certification:: I certify this patient will need inpatient services for at least 2 midnights  Expected Medical Readiness: 04/02/2023          B Medical/Surgery History Past Medical History:  Diagnosis Date   Cancer [facility])    right breast cancer   Diabetes mellitus without complication (HCC)    Hypertension    Stroke [facility])    Past Surgical History:  Procedure Laterality Date   BREAST SURGERY     right lumpectomy   KNEE SURGERY     bilateral     A IV Location/Drains/Wounds Patient Lines/Drains/Airways Status     Active Line/Drains/Airways     Name Placement date Placement time Site Days   Peripheral IV 04/01/23 22 G Posterior;Right Wrist 04/01/23  0624  Wrist  less than 1            Intake/Output Last 24 hours  Intake/Output Summary (Last 24 hours) at 04/01/2023 1452 Last data filed at 04/01/2023 0753 Gross per 24 hour  Intake 84.35 ml  Output --  Net 84.35 ml    Labs/Imaging Results for orders placed or performed during the hospital encounter of 03/30/23 (from the past 48 hours)  Basic metabolic panel     Status: Abnormal   Collection Time: 03/30/23  4:35 PM  Result Value Ref Range   Sodium 135 135 -  145 mmol/L   Potassium 3.8 3.5 - 5.1 mmol/L   Chloride 100 98 - 111 mmol/L   CO2 19 (L) 22 - 32 mmol/L   Glucose, Bld 133 (H) 70 - 99 mg/dL    Comment: Glucose reference range applies only to samples taken after fasting for at least 8 hours.   BUN 15 8 - 23 mg/dL   Creatinine, Ser 4.09 (H) 0.44 - 1.00 mg/dL   Calcium 8.7 (L) 8.9 - 10.3 mg/dL   GFR, Estimated 46 (L) >60 mL/min    Comment: (NOTE) Calculated using the CKD-EPI Creatinine Equation (2021)    Anion gap 16 (H) 5 - 15    Comment: Performed at [facility], [address] [place], [place] [address]  CBC      Status: None   Collection Time: 03/30/23  4:35 PM  Result Value Ref Range   WBC 6.4 4.0 - 10.5 K/uL   RBC 4.06 3.87 - 5.11 MIL/uL   Hemoglobin 12.5 12.0 - 15.0 g/dL   HCT 47.8 29.5 - 62.1 %   MCV 92.6 80.0 - 100.0 fL   MCH 30.8 26.0 - 34.0 pg   MCHC 33.2 30.0 - 36.0 g/dL   RDW 30.8 65.7 - 84.6 %   Platelets 291 150 - 400 K/uL   nRBC 0.0 0.0 - 0.2 %    Comment: Performed at [facility], [address] [place], [place] [address]  Troponin I (High Sensitivity)     Status: Abnormal   Collection Time: 03/30/23  4:35 PM  Result Value Ref Range   Troponin I (High Sensitivity) 246 (HH) <18 ng/L    Comment: CRITICAL RESULT CALLED TO, READ BACK BY AND VERIFIED WITH LISA THOMPSON AT 2050 ON 03/30/23 BY SS (NOTE) Elevated high sensitivity troponin I (hsTnI) values and significant  changes across serial measurements may suggest ACS but many other  chronic and acute conditions are known to elevate hsTnI results.  Refer to the "Links" section for chest pain algorithms and additional  guidance. Performed at [facility], [address] [place], [place] [address]   Urinalysis, Routine w reflex microscopic -Urine, Clean Catch     Status: Abnormal   Collection Time: 03/30/23  9:08 PM  Result Value Ref Range   Color, Urine YELLOW (A) YELLOW   APPearance CLEAR (A) CLEAR   Specific Gravity, Urine 1.017 1.005 - 1.030   pH 5.0 5.0 - 8.0   Glucose, UA NEGATIVE NEGATIVE mg/dL   Hgb urine dipstick NEGATIVE NEGATIVE   Bilirubin Urine NEGATIVE NEGATIVE   Ketones, ur 5 (A) NEGATIVE mg/dL   Protein, ur >=244 (A) NEGATIVE mg/dL   Nitrite NEGATIVE NEGATIVE   Leukocytes,Ua TRACE (A) NEGATIVE   RBC / HPF 0-5 0 - 5 RBC/hpf   WBC, UA 0-5 0 - 5 WBC/hpf   Bacteria, UA NONE SEEN NONE SEEN   Squamous Epithelial / HPF 0-5 0 - 5 /HPF   Mucus PRESENT    Hyaline Casts, UA PRESENT     Comment: Performed at [facility], [address], [place], [place] [address]  Brain  natriuretic peptide     Status: Abnormal   Collection Time: 03/30/23  9:08 PM  Result Value Ref Range   B Natriuretic Peptide 1,313.4 (H) 0.0 - 100.0 pg/mL    Comment: Performed at [facility], [address] [place], [place] [address]  CBG monitoring, ED     Status: Abnormal   Collection Time: 03/30/23  9:11  PM  Result Value Ref Range   Glucose-Capillary 101 (H) 70 - 99 mg/dL    Comment: Glucose reference range applies only to samples taken after fasting for at least 8 hours.  Troponin I (High Sensitivity)     Status: Abnormal   Collection Time: 03/30/23 10:21 PM  Result Value Ref Range   Troponin I (High Sensitivity) 255 (HH) <18 ng/L    Comment: CRITICAL VALUE NOTED. VALUE IS CONSISTENT WITH PREVIOUSLY REPORTED/CALLED VALUE ASW (NOTE) Elevated high sensitivity troponin I (hsTnI) values and significant  changes across serial measurements may suggest ACS but many other  chronic and acute conditions are known to elevate hsTnI results.  Refer to the "Links" section for chest pain algorithms and additional  guidance. Performed at [facility], [address] [place], [place] [address]   Troponin I (High Sensitivity)     Status: Abnormal   Collection Time: 03/31/23 12:59 AM  Result Value Ref Range   Troponin I (High Sensitivity) 269 (HH) <18 ng/L    Comment: CRITICAL VALUE NOTED. VALUE IS CONSISTENT WITH PREVIOUSLY REPORTED/CALLED VALUE ASW (NOTE) Elevated high sensitivity troponin I (hsTnI) values and significant  changes across serial measurements may suggest ACS but many other  chronic and acute conditions are known to elevate hsTnI results.  Refer to the "Links" section for chest pain algorithms and additional  guidance. Performed at [facility], [address] [place], [place] [address]   CBG monitoring, ED     Status: Abnormal   Collection Time: 03/31/23  2:41 AM  Result Value Ref Range   Glucose-Capillary 104 (H) 70 - 99 mg/dL     Comment: Glucose reference range applies only to samples taken after fasting for at least 8 hours.  CBC with Differential/Platelet     Status: None   Collection Time: 03/31/23  5:03 AM  Result Value Ref Range   WBC 5.7 4.0 - 10.5 K/uL   RBC 4.07 3.87 - 5.11 MIL/uL   Hemoglobin 12.7 12.0 - 15.0 g/dL   HCT 09.8 11.9 - 14.7 %   MCV 91.9 80.0 - 100.0 fL   MCH 31.2 26.0 - 34.0 pg   MCHC 34.0 30.0 - 36.0 g/dL   RDW 82.9 56.2 - 13.0 %   Platelets 262 150 - 400 K/uL   nRBC 0.0 0.0 - 0.2 %   Neutrophils Relative % 73 %   Neutro Abs 4.2 1.7 - 7.7 K/uL   Lymphocytes Relative 18 %   Lymphs Abs 1.0 0.7 - 4.0 K/uL   Monocytes Relative 7 %   Monocytes Absolute 0.4 0.1 - 1.0 K/uL   Eosinophils Relative 1 %   Eosinophils Absolute 0.0 0.0 - 0.5 K/uL   Basophils Relative 1 %   Basophils Absolute 0.0 0.0 - 0.1 K/uL   Immature Granulocytes 0 %   Abs Immature Granulocytes 0.02 0.00 - 0.07 K/uL    Comment: Performed at [facility], [address] [place], [place] [address]  Magnesium     Status: Abnormal   Collection Time: 03/31/23  5:03 AM  Result Value Ref Range   Magnesium 1.5 (L) 1.7 - 2.4 mg/dL    Comment: Performed at [facility], [address] [place], [place] [address]  Hemoglobin A1c     Status: Abnormal   Collection Time: 03/31/23  5:03 AM  Result Value Ref Range   Hgb A1c MFr Bld 6.4 (H) 4.8 - 5.6 %    Comment: (NOTE) Pre diabetes:  5.7%-6.4%  Diabetes:              >6.4%  Glycemic control for   <7.0% adults with diabetes    Mean Plasma Glucose 136.98 mg/dL    Comment: Performed at [facility], [address] [place], [place] [address]  Basic metabolic panel     Status: Abnormal   Collection Time: 03/31/23  5:03 AM  Result Value Ref Range   Sodium 139 135 - 145 mmol/L   Potassium 3.4 (L) 3.5 - 5.1 mmol/L   Chloride 99 98 - 111 mmol/L   CO2 23 22 - 32 mmol/L   Glucose, Bld 119 (H) 70 - 99 mg/dL    Comment: Glucose reference range  applies only to samples taken after fasting for at least 8 hours.   BUN 13 8 - 23 mg/dL   Creatinine, Ser 6.04 (H) 0.44 - 1.00 mg/dL   Calcium 8.8 (L) 8.9 - 10.3 mg/dL   GFR, Estimated 48 (L) >60 mL/min    Comment: (NOTE) Calculated using the CKD-EPI Creatinine Equation (2021)    Anion gap 17 (H) 5 - 15    Comment: Performed at [facility], [address] [place], [place] [address]  Hepatic function panel     Status: Abnormal   Collection Time: 03/31/23  5:12 AM  Result Value Ref Range   Total Protein 6.9 6.5 - 8.1 g/dL   Albumin 3.6 3.5 - 5.0 g/dL   AST 28 15 - 41 U/L   ALT 14 0 - 44 U/L   Alkaline Phosphatase 67 38 - 126 U/L   Total Bilirubin 1.3 (H) <1.2 mg/dL   Bilirubin, Direct 0.3 (H) 0.0 - 0.2 mg/dL   Indirect Bilirubin 1.0 (H) 0.3 - 0.9 mg/dL    Comment: Performed at [facility], [address] [place], [place] [address]  CBG monitoring, ED     Status: Abnormal   Collection Time: 03/31/23  7:53 AM  Result Value Ref Range   Glucose-Capillary 123 (H) 70 - 99 mg/dL    Comment: Glucose reference range applies only to samples taken after fasting for at least 8 hours.  CBG monitoring, ED     Status: Abnormal   Collection Time: 03/31/23 11:39 AM  Result Value Ref Range   Glucose-Capillary 134 (H) 70 - 99 mg/dL    Comment: Glucose reference range applies only to samples taken after fasting for at least 8 hours.  CBG monitoring, ED     Status: None   Collection Time: 03/31/23  3:55 PM  Result Value Ref Range   Glucose-Capillary 89 70 - 99 mg/dL    Comment: Glucose reference range applies only to samples taken after fasting for at least 8 hours.  CBG monitoring, ED     Status: Abnormal   Collection Time: 03/31/23  9:58 PM  Result Value Ref Range   Glucose-Capillary 171 (H) 70 - 99 mg/dL    Comment: Glucose reference range applies only to samples taken after fasting for at least 8 hours.  Basic metabolic panel     Status: Abnormal   Collection Time:  04/01/23  4:18 AM  Result Value Ref Range   Sodium 138 135 - 145 mmol/L   Potassium 2.8 (L) 3.5 - 5.1 mmol/L   Chloride 97 (L) 98 - 111 mmol/L   CO2 28 22 - 32 mmol/L   Glucose, Bld 93 70 - 99 mg/dL    Comment: Glucose reference range applies only to samples  taken after fasting for at least 8 hours.   BUN 16 8 - 23 mg/dL   Creatinine, Ser 1.61 (H) 0.44 - 1.00 mg/dL   Calcium 8.7 (L) 8.9 - 10.3 mg/dL   GFR, Estimated 47 (L) >60 mL/min    Comment: (NOTE) Calculated using the CKD-EPI Creatinine Equation (2021)    Anion gap 13 5 - 15    Comment: Performed at [facility], [address] [place], [place] [address]  Magnesium     Status: None   Collection Time: 04/01/23  4:18 AM  Result Value Ref Range   Magnesium 1.9 1.7 - 2.4 mg/dL    Comment: Performed at [facility], [address] [place], [place] [address]  CBG monitoring, ED     Status: Abnormal   Collection Time: 04/01/23  7:15 AM  Result Value Ref Range   Glucose-Capillary 101 (H) 70 - 99 mg/dL    Comment: Glucose reference range applies only to samples taken after fasting for at least 8 hours.   Comment 1 Document in Chart   CBG monitoring, ED     Status: Abnormal   Collection Time: 04/01/23 11:35 AM  Result Value Ref Range   Glucose-Capillary 181 (H) 70 - 99 mg/dL    Comment: Glucose reference range applies only to samples taken after fasting for at least 8 hours.   Comment 1 Document in Chart    [place] CHEST (PLEURAL EFFUSION) Result Date: 03/31/2023 CLINICAL DATA:  Patient admitted with acute CHF exacerbation with new right pleural effusion. Request for diagnostic and therapeutic thoracentesis. EXAM: CHEST ULTRASOUND COMPARISON:  CXR 03/30/23, CTA chest 03/30/23 FINDINGS: Examination of the left chest shows no pleural fluid. Examination of the right chest shows minimal pleural effusion which is intermittently obscured by lung during respiration. No safe window for thoracentesis. IMPRESSION: Minimal right  pleural effusion with no safe window for thoracentesis. No procedure was performed. Electronically Signed   By: Acquanetta Belling M.D.   On: 03/31/2023 11:09   CT Angio Chest PE W/Cm &/Or Wo Cm Result Date: 03/30/2023 CLINICAL DATA:  High probability pulmonary embolism, leg swelling EXAM: CT ANGIOGRAPHY CHEST WITH CONTRAST TECHNIQUE: Multidetector CT imaging of the chest was performed using the standard protocol during bolus administration of intravenous contrast. Multiplanar CT image reconstructions and MIPs were obtained to evaluate the vascular anatomy. RADIATION DOSE REDUCTION: This exam was performed according to the departmental dose-optimization program which includes automated exposure control, adjustment of the mA and/or kV according to patient size and/or use of iterative reconstruction technique. CONTRAST:  75mL OMNIPAQUE IOHEXOL 350 MG/ML SOLN COMPARISON:  None Available. FINDINGS: Cardiovascular: There is adequate opacification of the pulmonary arterial tree. There is poor enhancement of the right lower lobar pulmonary arterial vasculature which relates to collapse of the right lower lobe pulmonary vascular redistribution. No intraluminal filling defects identified to suggest acute pulmonary embolism. The central pulmonary arteries are normal caliber. Extensive multi-vessel coronary artery calcification is identified. Global cardiac size is enlarged with biventricular enlargement noted. There is reflux of contrast into the hepatic venous system in keeping with of the some degree of right heart failure. No pericardial effusion. Moderate atherosclerotic calcification within the thoracic aorta. No aortic aneurysm. Mediastinum/Nodes: There is pathologic enlargement of right hilar, right paratracheal, and subcarinal lymph nodes with multiple lymph nodes measuring 18-22 mm in short axis diameter. This appears new since prior examination. Visualized thyroid is unremarkable. Esophagus is unremarkable.  Lungs/Pleura: Moderate right pleural effusion is present with compressive atelectasis  of the dependent right lung. Superimposed back glass 1 bilaterally likely relates to atelectasis. No pneumothorax. No pleural effusion left. No central obstructing lesion. Upper Abdomen: No acute abnormality.  Trace ascites Musculoskeletal: No chest wall abnormality. No acute or significant osseous findings. Review of the MIP images confirms the above findings. IMPRESSION: 1. No pulmonary embolism. 2. Moderate right pleural effusion with compressive atelectasis of the dependent right lung. No central obstructing lesion. 3. Cardiomegaly with reflux of contrast into the hepatic venous system in keeping with some degree of right heart failure. 4. Extensive multi-vessel coronary artery calcification. 5. Pathologic enlargement of right hilar, right paratracheal, and subcarinal lymph nodes, new since prior examination. Differential considerations include inflammatory conditions such as sarcoidosis, granulomatous infection, nodal metastatic disease, or lymphoproliferative disorders. Follow-up CT or PET CT examination is recommended 3 months to demonstrate resolution or direct potential biopsy strategy. 6. Trace ascites. Aortic Atherosclerosis (ICD10-I70.0). Electronically Signed   By: Helyn Numbers M.D.   On: 03/30/2023 23:42   DG Chest Port 1 View Result Date: 03/30/2023 CLINICAL DATA:  Dizziness EXAM: PORTABLE CHEST 1 VIEW COMPARISON:  02/01/2016 FINDINGS: Cardiac shadow is enlarged. Aortic calcifications are noted. The lungs are well aerated bilaterally. Small right pleural effusion is seen. No bony abnormality is noted. IMPRESSION: Small right pleural effusion. Electronically Signed   By: Alcide Clever M.D.   On: 03/30/2023 23:09    Pending Labs Unresulted Labs (From admission, onward)     Start     Ordered   04/02/23 0500  Lipid panel  Tomorrow morning,   R        04/01/23 1404   04/01/23 0500  Basic metabolic panel   Daily,   R      03/31/23 0857            Vitals/Pain Today's Vitals   04/01/23 1300 04/01/23 1330 04/01/23 1400 04/01/23 1430  BP: 110/71 103/73 101/64 106/78  Pulse: 83 78 80 78  Resp: 18 14 18 15   Temp:      TempSrc:      SpO2: 100% 100% 100% 95%  Weight:      Height:      PainSc:        Isolation Precautions No active isolations  Medications Medications  sodium chloride flush (NS) 0.9 % injection 3 mL (3 mLs Intravenous Given 04/01/23 1012)  sodium chloride flush (NS) 0.9 % injection 3 mL (has no administration in time range)  0.9 %  sodium chloride infusion (has no administration in time range)  acetaminophen (TYLENOL) tablet 650 mg (650 mg Oral Given 03/31/23 1314)  ondansetron (ZOFRAN) injection 4 mg (has no administration in time range)  furosemide (LASIX) injection 40 mg (40 mg Intravenous Given 04/01/23 0839)  insulin aspart (novoLOG) injection 0-15 Units (3 Units Subcutaneous Given 04/01/23 1143)  insulin aspart (novoLOG) injection 0-5 Units ( Subcutaneous Not Given 03/31/23 2205)  aspirin EC tablet 81 mg (81 mg Oral Given 04/01/23 0942)  memantine (NAMENDA) tablet 20 mg (20 mg Oral Given 03/31/23 1733)  oxybutynin (DITROPAN-XL) 24 hr tablet 5 mg (5 mg Oral Given 04/01/23 0943)  pravastatin (PRAVACHOL) tablet 20 mg (20 mg Oral Given 03/31/23 1733)  allopurinol (ZYLOPRIM) tablet 100 mg (100 mg Oral Given 04/01/23 0941)  colchicine tablet 0.6 mg (0.6 mg Oral Given 04/01/23 0943)  iohexol (OMNIPAQUE) 350 MG/ML injection 75 mL (75 mLs Intravenous Contrast Given 03/30/23 2307)  furosemide (LASIX) injection 40 mg (40 mg Intravenous Given 03/31/23 0100)  magnesium sulfate IVPB  2 g 50 mL (0 g Intravenous Stopped 03/31/23 0728)  magnesium sulfate IVPB 2 g 50 mL (0 g Intravenous Stopped 03/31/23 0946)  potassium chloride SA (KLOR-CON M) CR tablet 40 mEq (40 mEq Oral Given 04/01/23 0603)  potassium chloride SA (KLOR-CON M) CR tablet 40 mEq (40 mEq Oral Given 04/01/23  0837)  potassium chloride SA (KLOR-CON M) CR tablet 20 mEq (20 mEq Oral Given 04/01/23 6213)    Mobility walks with person assist       R Recommendations: See Admitting Provider Note  Report given to:   Additional Notes:

## 2023-04-01 NOTE — Progress Notes (Signed)
2D echo reviewed:  1. Left ventricular ejection fraction, by estimation, is 20 to 25%. Left  ventricular ejection fraction by 3D volume is 21 %. The left ventricle has  severely decreased function. The left ventricle demonstrates global  hypokinesis. Left ventricular  diastolic parameters are consistent with Grade III diastolic dysfunction  (restrictive). Elevated left ventricular end-diastolic pressure.   2. Right ventricular systolic function is severely reduced. The right  ventricular size is mildly enlarged. Tricuspid regurgitation signal is  inadequate for assessing PA pressure.   3. Left atrial size was mildly dilated.   4. Right atrial size was mild to moderately dilated.   5. The mitral valve is degenerative. Mild mitral valve regurgitation. No  evidence of mitral stenosis.   6. Tricuspid valve regurgitation is severe.   7. The aortic valve is tricuspid. Aortic valve regurgitation is not  visualized. Aortic valve sclerosis/calcification is present, without any  evidence of aortic stenosis. Aortic valve Vmax measures 1.41 m/s.   8. The inferior vena cava is normal in size with <50% respiratory  variability, suggesting right atrial pressure of 8 mmHg.   Recommendations based on echo findings: -right and left heart cath on Monday -start GDMT with Losartan 12.5mg  daily, Cleda Daub 12.5mg  daily -if BP tolerates then add on Carvedilol 3.125mg  BID and SGLT2i

## 2023-04-01 NOTE — Progress Notes (Signed)
  Echocardiogram 2D Echocardiogram has been performed.  [name] [name] 04/01/2023, 1:44 PM

## 2023-04-01 NOTE — ED Notes (Signed)
Per MD Wouk, IV potassium can be stopped.

## 2023-04-01 NOTE — ED Notes (Addendum)
Patient still c/o run of potassium burning.  Patient's IV patent and healthy.  Patient requesting the potassium to be stopped.  Patient asked if she could "just take pills."  Patient CAOx3, disoriented to time.  IV potassium paused and NP Jon Billings made aware.

## 2023-04-02 DIAGNOSIS — I5023 Acute on chronic systolic (congestive) heart failure: Secondary | ICD-10-CM | POA: Diagnosis not present

## 2023-04-02 LAB — GLUCOSE, CAPILLARY
Glucose-Capillary: 154 mg/dL — ABNORMAL HIGH (ref 70–99)
Glucose-Capillary: 168 mg/dL — ABNORMAL HIGH (ref 70–99)
Glucose-Capillary: 150 mg/dL — ABNORMAL HIGH (ref 70–99)
Glucose-Capillary: 143 mg/dL — ABNORMAL HIGH (ref 70–99)

## 2023-04-02 LAB — LIPID PANEL
Cholesterol: 118 mg/dL (ref 0–200)
HDL: 43 mg/dL (ref >40)
LDL Cholesterol: 60 mg/dL (ref 0–99)
Total CHOL/HDL Ratio: 2.7 {ratio}
Triglycerides: 74 mg/dL (ref <150)
VLDL: 15 mg/dL (ref 0–40)

## 2023-04-02 LAB — BASIC METABOLIC PANEL
Anion gap: 13 (ref 5–15)
BUN: 17 mg/dL (ref 8–23)
CO2: 26 mmol/L (ref 22–32)
Calcium: 8.6 mg/dL — ABNORMAL LOW (ref 8.9–10.3)
Chloride: 99 mmol/L (ref 98–111)
Creatinine, Ser: 1.26 mg/dL — ABNORMAL HIGH (ref 0.44–1.00)
GFR, Estimated: 42 mL/min — ABNORMAL LOW (ref >60)
Glucose, Bld: 121 mg/dL — ABNORMAL HIGH (ref 70–99)
Potassium: 3.6 mmol/L (ref 3.5–5.1)
Sodium: 138 mmol/L (ref 135–145)

## 2023-04-02 MED ORDER — POTASSIUM CHLORIDE CRYS ER 20 MEQ PO TBCR
40.0000 meq | EXTENDED_RELEASE_TABLET | Freq: Once | ORAL | Status: AC
  Administered 2023-04-02: 40 meq via ORAL
  Filled 2023-04-02: qty 2

## 2023-04-02 MED ORDER — DIGOXIN 125 MCG PO TABS
0.1250 mg | ORAL_TABLET | Freq: Every day | ORAL | Status: DC
  Administered 2023-04-02: 0.125 mg via ORAL
  Filled 2023-04-02 (×2): qty 1

## 2023-04-02 NOTE — Progress Notes (Signed)
Transition of Care [facility]) - Inpatient Brief Assessment   Patient Details  Name: Zalynn Sizemore MRN: [id number] Date of Birth: 04/29/1935  Transition of Care [facility]) CM/SW Contact:    Bing Quarry, RN Phone Number: 04/02/2023, 11:52 AM   Clinical Narrative: 12/22: Admitted 12/19 [facility] via [facility] [place]. Pt c/o weakness, bilateral feet swelling, and dizziness. Pt is A&Ox4 per triage notes. HTX of T2DM, HLD, and HFrEF. Admitted for RIGHT side CHFand Right pleural effusion found. . Now IP. Patient has Medicare A/B insurance. Remains with ongoing medical issues/treatment. Heart Failure Consult completed. Advanced heart failure service to see tomorrow per cadiology.   PT evals in for [facility] in Friday, 12/20 around 1 pm while in ED setting.   PCP: Daniel Nones [phone] Contact:  Cunningham,Gayle (Daughter) [phone] (Mobile)   Transition of Care Asessment: Insurance and Status: Insurance coverage has been reviewed Patient has primary care physician: Yes Home environment has been reviewed: [facility]. Prior level of function:: Family/patient expects to be discharged to [facility] independent living. Independent/Modified Independent  Mobility Comments: uses walker. Receives one meal provided by facility per day. Prior/Current Home Services: No current home services Social Drivers of Health Review: SDOH reviewed no interventions necessary Readmission risk has been reviewed: Yes (10%) Transition of care needs: transition of care needs identified, TOC will continue to follow

## 2023-04-02 NOTE — Progress Notes (Signed)
Progress Note  Patient Name: [name] [name] Date of Encounter: 04/02/2023  Primary Cardiologist: New  Subjective   Echo showed an EF of 20-25% with global hypokinesis, Gr3DD, elevated LVEDP, severely reduced RV systolic function with mildly enlarged ventricular cavity size, mildly dilated left atrium, mildly to moderate dilated right atrium, degenerative mitral valve with mild regurgitation, severe tricuspid regurgitation, and aortic valve sclerosis without evidence of stenosis.   She denies chest pain, dyspnea, palpitations, dizziness, presyncope, or syncope. Again declines cardiac cath. States "I want to go home. I have to die at some point anyway."  Inpatient Medications    Scheduled Meds:  allopurinol  100 mg Oral Daily   aspirin EC  81 mg Oral Daily   colchicine  0.6 mg Oral Daily   furosemide  40 mg Intravenous BID   insulin aspart  0-15 Units Subcutaneous TID WC   insulin aspart  0-5 Units Subcutaneous QHS   losartan  12.5 mg Oral Daily   memantine  20 mg Oral QPM   oxybutynin  5 mg Oral Daily   pravastatin  20 mg Oral QPM   sodium chloride flush  3 mL Intravenous Q12H   spironolactone  12.5 mg Oral Daily   Continuous Infusions:  PRN Meds: acetaminophen, ondansetron (ZOFRAN) IV, sodium chloride flush   Vital Signs    Vitals:   04/01/23 1541 04/01/23 2105 04/02/23 0031 04/02/23 0342  BP: 131/89 93/63 108/73 100/79  Pulse: 83 85 81 79  Resp: 19 18 18 18   Temp: 97.9 F (36.6 C) 97.8 F (36.6 C) 98 F (36.7 C) (!) 97.5 F (36.4 C)  TempSrc:    Oral  SpO2: 100% 100% 99% 100%  Weight:      Height:        Intake/Output Summary (Last 24 hours) at 04/02/2023 0751 Last data filed at 04/02/2023 0500 Gross per 24 hour  Intake 84.35 ml  Output 500 ml  Net -415.65 ml   Filed Weights   03/30/23 1631  Weight: 68.9 kg    Telemetry    SR with short atrial run, 5 beat run of NSVT, and PVCs - Personally Reviewed  ECG    No new tracings - Personally  Reviewed  Physical Exam   GEN: No acute distress.   Neck: No JVD. Cardiac: RRR, no murmurs, rubs, or gallops.  Respiratory: Mildly diminished along the right base.  GI: Soft, nontender, non-distended.   MS: No edema; No deformity. Neuro:  Alert and oriented x 3; Nonfocal.  Psych: Normal affect.  Labs    Chemistry Recent Labs  Lab 03/31/23 0503 03/31/23 0512 04/01/23 0418 04/02/23 0422  NA 139  --  138 138  K 3.4*  --  2.8* 3.6  CL 99  --  97* 99  CO2 23  --  28 26  GLUCOSE 119*  --  93 121*  BUN 13  --  16 17  CREATININE 1.13*  --  1.15* 1.26*  CALCIUM 8.8*  --  8.7* 8.6*  PROT  --  6.9  --   --   ALBUMIN  --  3.6  --   --   AST  --  28  --   --   ALT  --  14  --   --   ALKPHOS  --  67  --   --   BILITOT  --  1.3*  --   --   GFRNONAA 48*  --  47* 42*  ANIONGAP 17*  --  13 13     Hematology Recent Labs  Lab 03/30/23 1635 03/31/23 0503  WBC 6.4 5.7  RBC 4.06 4.07  HGB 12.5 12.7  HCT 37.6 37.4  MCV 92.6 91.9  MCH 30.8 31.2  MCHC 33.2 34.0  RDW 14.5 14.5  PLT 291 262    Cardiac EnzymesNo results for input(s): "TROPONINI" in the last 168 hours. No results for input(s): "TROPIPOC" in the last 168 hours.   BNP Recent Labs  Lab 03/30/23 2108  BNP 1,313.4*     DDimer No results for input(s): "DDIMER" in the last 168 hours.   Radiology    [place] CHEST (PLEURAL EFFUSION) Result Date: 03/31/2023 IMPRESSION: Minimal right pleural effusion with no safe window for thoracentesis. No procedure was performed. Electronically Signed   By: Acquanetta Belling M.D.   On: 03/31/2023 11:09   CT Angio Chest PE W/Cm &/Or Wo Cm Result Date: 03/30/2023 IMPRESSION: 1. No pulmonary embolism. 2. Moderate right pleural effusion with compressive atelectasis of the dependent right lung. No central obstructing lesion. 3. Cardiomegaly with reflux of contrast into the hepatic venous system in keeping with some degree of right heart failure. 4. Extensive multi-vessel coronary artery  calcification. 5. Pathologic enlargement of right hilar, right paratracheal, and subcarinal lymph nodes, new since prior examination. Differential considerations include inflammatory conditions such as sarcoidosis, granulomatous infection, nodal metastatic disease, or lymphoproliferative disorders. Follow-up CT or PET CT examination is recommended 3 months to demonstrate resolution or direct potential biopsy strategy. 6. Trace ascites. Aortic Atherosclerosis (ICD10-I70.0). Electronically Signed   By: Helyn Numbers M.D.   On: 03/30/2023 23:42   DG Chest Port 1 View Result Date: 03/30/2023 IMPRESSION: Small right pleural effusion. Electronically Signed   By: Alcide Clever M.D.   On: 03/30/2023 23:09    Cardiac Studies   2D echo 04/01/2023: 1. Left ventricular ejection fraction, by estimation, is 20 to 25%. Left  ventricular ejection fraction by 3D volume is 21 %. The left ventricle has  severely decreased function. The left ventricle demonstrates global  hypokinesis. Left ventricular  diastolic parameters are consistent with Grade III diastolic dysfunction  (restrictive). Elevated left ventricular end-diastolic pressure.   2. Right ventricular systolic function is severely reduced. The right  ventricular size is mildly enlarged. Tricuspid regurgitation signal is  inadequate for assessing PA pressure.   3. Left atrial size was mildly dilated.   4. Right atrial size was mild to moderately dilated.   5. The mitral valve is degenerative. Mild mitral valve regurgitation. No  evidence of mitral stenosis.   6. Tricuspid valve regurgitation is severe.   7. The aortic valve is tricuspid. Aortic valve regurgitation is not  visualized. Aortic valve sclerosis/calcification is present, without any  evidence of aortic stenosis. Aortic valve Vmax measures 1.41 m/s.   8. The inferior vena cava is normal in size with <50% respiratory  variability, suggesting right atrial pressure of 8 mmHg.   __________  2D echo 03/30/2022 Gavin Potters): AORTIC ROOT                   Size: Normal             Dissection: INDETERM FOR DISSECTION  AORTIC VALVE               Leaflets: Tricuspid                   Morphology: MILDLY THICKENED  Mobility: Fully mobile  LEFT VENTRICLE                   Size: Normal                        Anterior: HYPOCONTRACTILE            Contraction: MOD GLOBAL DECREASE             Septal: HYPOCONTRACTILE             Closest EF: 30% (Estimated)                 Apical: HYPOCONTRACTILE              LV Masses: No Masses                     Inferior: HYPOCONTRACTILE                    LVH: MILD LVH                     Posterior: HYPOCONTRACTILE           Dias.FxClass: (Grade 1) relaxation abnormal, E/A reversal  MITRAL VALVE               Leaflets: Normal                        Mobility: Fully mobile             Morphology: Normal  LEFT ATRIUM                   Size: SEVERELY ENLARGED            LA Masses: No masses              IA Septum: Normal IAS  MAIN PA                   Size: Normal  PULMONIC VALVE             Morphology: Normal                        Mobility: Fully mobile  RIGHT VENTRICLE              RV Masses: No Masses                         Size: Normal              Free Wall: Normal                     Contraction: SEVERE GLOBAL DECREASE  TRICUSPID VALVE               Leaflets: Normal                        Mobility: Fully mobile             Morphology: Normal  RIGHT ATRIUM               RA Other: None                           RA Mass: No masses  Size: MODERATELY ENLARGED  PERICARDIUM                 Fluid: No effusion  INFERIOR VENACAVA                   Size: Normal Normal respiratory collapse  _________________________________________________________________________________________   DOPPLER ECHO and OTHER SPECIAL PROCEDURES                 Aortic: TRIVIAL AR                 No AS                 Mitral:  MILD MR                    No MS                         MV Inflow E Vel = 98.5 cm/sec     MV Annulus E'Vel = 7.1 cm/sec                         E/E'Ratio = 13.9              Tricuspid: SEVERE TR                  No TS                         385.0 cm/sec peak TR vel   62.3 mmHg peak RV pressure              Pulmonary: TRIVIAL PR                 No PS  _________________________________________________________________________________________  INTERPRETATION  MODERATE LV SYSTOLIC DYSFUNCTION (See above)  SEVERE RV SYSTOLIC DYSFUNCTION (See above)  SEVERE VALVULAR REGURGITATION (See above)  NO VALVULAR STENOSIS  Severely elevated pulmonary pressures with severe TR   Patient Profile     87 y.o. female with history of CAD noted on CT imaging, HFrEF with possible RV failure, severe tricuspid regurgitation, DM2, and HLD admitted with dizziness and fatigue who we are seeing for HFrEF and elevated troponin.   Assessment & Plan    1. HFrEF with RV failure with right pleural effusion with severe TR: -Evaluated by advanced heart failure team on 12/20, mildly volume up at that time -Prior echo, through [facility], read as severe TR and RV failure with EF 30% -Echo this admission with EF 20-25% with severe RV dysfunction and severe TR -Minimal right pleural effusion noted by ultrasound without safe window for thoracentesis  -Started on losartan 12.5 mg and spironolactone 12.5 mg on 12/21 -With RV failure, start digoxin 0.125 mg daily -Escalate GDMT with addition of Coreg and SGLT2i moving forward as vitals and labs allow -Recommend R/LHC on 12/223, however the patient again declines this stating she does not want invasive testing and that "I have to die at some point anyway" -She understands the risks of not evaluating her cardiomyopathy further, up to and including death -Intake/output not accurate  -With increasing SCr, hold IV Lasix for today -Resume follow up with advanced heart failure team  on 12/23   2. CAD involving the native coronary arteries with elevated high sensitivity troponin: -Never with chest pain or dyspnea -High sensitivity troponin trended to 269, possibly in the setting of supply demand ischemia with  hypervolemia  -CT imaging this admission notable for extensive multi-vessel coronary artery calcification  -She declines R/LHC as above -ASA and statin   3. Hypokalemia: -Improving, replete to goal 4.0 -Magnesium normal      For questions or updates, please contact [facility] Please consult [email or web address] for contact info under Cardiology/STEMI.    Signed, Eula Listen, PA-C [facility] Pager: [phone] 04/02/2023, 7:51 AM

## 2023-04-02 NOTE — Progress Notes (Signed)
PROGRESS NOTE    [name] [name]  WUJ:[id number] DOB: 09/09/1935 DOA: 03/30/2023 PCP: Lynnea Ferrier, MD     Brief Narrative:   From admission h and p  This 87 year old female with past medical history of T2DM, HLD, and HFrEF. Most recent echo done 03/30/2022 showed LVEF of 30%.  Patient presents with  complaints dizziness, leg swelling, generalized malaise.  She denies chest pain.  She endorses some mild shortness of breath.  She was brought to the ER.  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 Updated TTE shows reduction in EF to 20-25.  - lasix on pause given bump in creatinine - monitor I/os - patient has declined cath - digoxin started - cardiology advising staying the night so that the advanced heart failure team can see her tomorrow  # Troponiemia # CAD Without chest pain, no overt ischemic changes on EKG, likely demand though could have significant CAD, last cath appears to have been in 2015 with non-obstructive CAD. Cardiology discussed cath with patient todayshe declines. - continue asa, statin  # Pleural effusion, right # Lymphadenopathy, chest Moderate on cxr, probably 2/2 chf though with multiple enlarged lymph nodes in the chest as well. IR consulted, u/s shows small effusion too small to tap - diuresis as above - outpt repeat imaging of  chest in 3 months per radiology  # Hx CVA LDL less than 70 - continue home asa, statin  # T2DM Glucose appropriate. A1c 6.4 - SSI for now  # HTN BP appropriate - hold home benazepril and hydrochlorothiazide for now while we diurese and institute gdmt  # Gout No exacerbation - home allopurinol and colchicine  # Debility Resides at [facility], ambulates with a walker. PT consulted, advises HH PT   DVT prophylaxis: lovenox Code Status: dnr confirmed w/ patient Family Communication: daughter updated telephonically 12/22  Level of care: Telemetry Cardiac Status is: Inpatient Remains inpatient appropriate because: severity of illness    Consultants:  cardiology  Procedures: none  Antimicrobials:  none    Subjective: Reports feeling fine, tolerating diet, no chest pain, no dyspnea  Objective: Vitals:   04/01/23 2105 04/02/23 0031 04/02/23 0342 04/02/23 0745  BP: 93/63 108/73 100/79 121/76  Pulse: 85 81 79 82  Resp: 18 18 18    Temp: 97.8 F (36.6 C) 98 F (36.7 C) (!) 97.5 F (36.4 C) 98 F (36.7 C)  TempSrc:   Oral Oral  SpO2: 100% 99% 100% 98%  Weight:      Height:        Intake/Output Summary (Last 24 hours) at 04/02/2023 1049 Last data filed at 04/02/2023 0500 Gross per 24 hour  Intake --  Output 500 ml  Net -500 ml   Filed Weights   03/30/23 1631  Weight: 68.9 kg    Examination:  General exam:  Appears calm and comfortable  Respiratory system: Clear to auscultation. Save for rales at bases and decreased breath sounds right base Cardiovascular system: S1 & S2 heard, RRR. Systolic murmur Gastrointestinal system: Abdomen is nondistended, soft and nontender.  Central nervous system: Alert and oriented. No focal neurological deficits. Extremities: Symmetric 5 x 5 power. Trace pitting edema LEs Skin: No rashes, lesions or ulcers Psychiatry: Judgement and insight appear normal. Mood & affect appropriate.     Data  Reviewed: I have personally reviewed following labs and imaging studies  CBC: Recent Labs  Lab 03/30/23 1635 03/31/23 0503  WBC 6.4 5.7  NEUTROABS  --  4.2  HGB 12.5 12.7  HCT 37.6 37.4  MCV 92.6 91.9  PLT 291 262   Basic Metabolic Panel: Recent Labs  Lab 03/30/23 1635 03/31/23 0503 04/01/23 0418 04/02/23 0422  NA 135 139 138 138  K 3.8 3.4* 2.8* 3.6  CL 100 99 97* 99  CO2 19* 23 28 26   GLUCOSE 133* 119* 93 121*  BUN 15 13 16 17   CREATININE 1.16* 1.13* 1.15* 1.26*  CALCIUM 8.7* 8.8* 8.7* 8.6*  MG  --  1.5* 1.9  --    GFR: Estimated Creatinine Clearance: 31.8 mL/min (A) (by C-G formula based on SCr of 1.26 mg/dL (H)). Liver Function Tests: Recent Labs  Lab 03/31/23 0512  AST 28  ALT 14  ALKPHOS 67  BILITOT 1.3*  PROT 6.9  ALBUMIN 3.6   No results for input(s): "LIPASE", "AMYLASE" in the last 168 hours. No results for input(s): "AMMONIA" in the last 168 hours. Coagulation Profile: No results for input(s): "INR", "PROTIME" in the last 168 hours. Cardiac Enzymes: No results for input(s): "CKTOTAL", "CKMB", "CKMBINDEX", "TROPONINI" in the last 168 hours. BNP (last 3 results) No results for input(s): "PROBNP" in the last 8760 hours. HbA1C: Recent Labs    03/31/23 0503  HGBA1C 6.4*   CBG: Recent Labs  Lab 04/01/23 0715 04/01/23 1135 04/01/23 1604 04/01/23 2103 04/02/23 0759  GLUCAP 101* 181* 87 202* 154*   Lipid Profile: Recent Labs    04/02/23 0422  CHOL 118  HDL 43  LDLCALC 60  TRIG 74  CHOLHDL 2.7   Thyroid Function Tests: No results for input(s): "TSH", "T4TOTAL", "FREET4", "T3FREE", "THYROIDAB" in the last 72 hours. Anemia Panel: No results for input(s): "VITAMINB12", "FOLATE", "FERRITIN", "TIBC", "IRON", "RETICCTPCT" in the last 72 hours. Urine analysis:    Component Value Date/Time   COLORURINE YELLOW (A) 03/30/2023 2108   APPEARANCEUR CLEAR (A) 03/30/2023 2108   APPEARANCEUR Clear 03/12/2014 1000   LABSPEC 1.017 03/30/2023 2108    LABSPEC 1.018 03/12/2014 1000   PHURINE 5.0 03/30/2023 2108   GLUCOSEU NEGATIVE 03/30/2023 2108   GLUCOSEU Negative 03/12/2014 1000   HGBUR NEGATIVE 03/30/2023 2108   BILIRUBINUR NEGATIVE 03/30/2023 2108   BILIRUBINUR Negative 03/12/2014 1000   KETONESUR 5 (A) 03/30/2023 2108   PROTEINUR >=300 (A) 03/30/2023 2108   NITRITE NEGATIVE 03/30/2023 2108   LEUKOCYTESUR TRACE (A) 03/30/2023 2108   LEUKOCYTESUR Trace 03/12/2014 1000   Sepsis Labs: @LABRCNTIP (procalcitonin:4,lacticidven:4)  )No results found for this or any previous visit (from the past 240 hours).       Radiology Studies: ECHOCARDIOGRAM COMPLETE Result Date: 04/01/2023    ECHOCARDIOGRAM REPORT   Patient Name:   [name] [name] Date of Exam: 04/01/2023 Medical Rec #:  [id number]     Height:       65.0 in Accession #:    [id number]    Weight:  152.0 lb Date of Birth:  11/04/1935     BSA:          1.760 m Patient Age:    87 years      BP:           121/82 mmHg Patient Gender: F             HR:           96 bpm. Exam Location:  [facility] Procedure: 2D Echo and 3D Echo Indications:     CHF I50.9  History:         Patient has no prior history of Echocardiogram examinations.  Sonographer:     Overton Mam RDCS, FASE Referring Phys:  [id number] Wilfred Curtis DGUY Diagnosing Phys: Armanda Magic MD IMPRESSIONS  1. Left ventricular ejection fraction, by estimation, is 20 to 25%. Left ventricular ejection fraction by 3D volume is 21 %. The left ventricle has severely decreased function. The left ventricle demonstrates global hypokinesis. Left ventricular diastolic parameters are consistent with Grade III diastolic dysfunction (restrictive). Elevated left ventricular end-diastolic pressure.  2. Right ventricular systolic function is severely reduced. The right ventricular size is mildly enlarged. Tricuspid regurgitation signal is inadequate for assessing PA pressure.  3. Left atrial size was mildly dilated.  4. Right atrial size was mild to  moderately dilated.  5. The mitral valve is degenerative. Mild mitral valve regurgitation. No evidence of mitral stenosis.  6. Tricuspid valve regurgitation is severe.  7. The aortic valve is tricuspid. Aortic valve regurgitation is not visualized. Aortic valve sclerosis/calcification is present, without any evidence of aortic stenosis. Aortic valve Vmax measures 1.41 m/s.  8. The inferior vena cava is normal in size with <50% respiratory variability, suggesting right atrial pressure of 8 mmHg. FINDINGS  Left Ventricle: Left ventricular ejection fraction, by estimation, is 20 to 25%. Left ventricular ejection fraction by 3D volume is 21 %. The left ventricle has severely decreased function. The left ventricle demonstrates global hypokinesis. The left ventricular internal cavity size was normal in size. There is no left ventricular hypertrophy. Left ventricular diastolic parameters are consistent with Grade III diastolic dysfunction (restrictive). Elevated left ventricular end-diastolic pressure. Right Ventricle: The right ventricular size is mildly enlarged. No increase in right ventricular wall thickness. Right ventricular systolic function is severely reduced. Tricuspid regurgitation signal is inadequate for assessing PA pressure. Left Atrium: Left atrial size was mildly dilated. Right Atrium: Right atrial size was mild to moderately dilated. Pericardium: There is no evidence of pericardial effusion. Mitral Valve: The mitral valve is degenerative in appearance. There is mild thickening of the mitral valve leaflet(s). Mild mitral valve regurgitation. No evidence of mitral valve stenosis. Tricuspid Valve: The tricuspid valve is normal in structure. Tricuspid valve regurgitation is severe. No evidence of tricuspid stenosis. Aortic Valve: The aortic valve is tricuspid. Aortic valve regurgitation is not visualized. Aortic valve sclerosis/calcification is present, without any evidence of aortic stenosis. Aortic valve  peak gradient measures 8.0 mmHg. Pulmonic Valve: The pulmonic valve was normal in structure. Pulmonic valve regurgitation is not visualized. No evidence of pulmonic stenosis. Aorta: The aortic root is normal in size and structure. Venous: The inferior vena cava is normal in size with less than 50% respiratory variability, suggesting right atrial pressure of 8 mmHg. IAS/Shunts: No atrial level shunt detected by color flow Doppler.  LEFT VENTRICLE PLAX 2D LVIDd:         4.00 cm         Diastology LVIDs:  3.40 cm         LV e' medial:    5.00 cm/s LV PW:         1.40 cm         LV E/e' medial:  15.9 LV IVS:        2.20 cm         LV e' lateral:   5.98 cm/s LVOT diam:     1.70 cm         LV E/e' lateral: 13.3 LV SV:         26 LV SV Index:   15 LVOT Area:     2.27 cm        3D Volume EF                                LV 3D EF:    Left                                             ventricul LV Volumes (MOD)                            ar LV vol d, MOD    58.3 ml                    ejection A2C:                                        fraction LV vol d, MOD    64.3 ml                    by 3D A4C:                                        volume is LV vol s, MOD    37.3 ml                    21 %. A2C: LV vol s, MOD    48.8 ml A4C:                           3D Volume EF: LV SV MOD A2C:   21.0 ml       3D EF:        21 % LV SV MOD A4C:   64.3 ml       LV EDV:       171 ml LV SV MOD BP:    19.2 ml       LV ESV:       136 ml                                LV SV:        35 ml RIGHT VENTRICLE RV Basal diam:  4.00 cm RV S prime:     8.49 cm/s TAPSE (M-mode): 1.2 cm LEFT ATRIUM  Index        RIGHT ATRIUM           Index LA diam:        4.20 cm 2.39 cm/m   RA Area:     19.00 cm LA Vol (A2C):   71.7 ml 40.73 ml/m  RA Volume:   56.50 ml  32.10 ml/m LA Vol (A4C):   55.4 ml 31.47 ml/m LA Biplane Vol: 68.3 ml 38.80 ml/m  AORTIC VALVE                 PULMONIC VALVE AV Area (Vmax): 1.14 cm     PV Vmax:        0.64 m/s  AV Vmax:        141.00 cm/s  PV Peak grad:   1.6 mmHg AV Peak Grad:   8.0 mmHg     RVOT Peak grad: 1 mmHg LVOT Vmax:      71.10 cm/s LVOT Vmean:     44.100 cm/s LVOT VTI:       0.114 m  AORTA Ao Root diam: 2.90 cm Ao Asc diam:  3.00 cm MITRAL VALVE               TRICUSPID VALVE MV Area (PHT): 5.02 cm    TV Peak grad:   31.4 mmHg MV Decel Time: 151 msec    TV Vmax:        2.80 m/s MV E velocity: 79.70 cm/s MV A velocity: 40.40 cm/s  SHUNTS MV E/A ratio:  1.97        Systemic VTI:  0.11 m                            Systemic Diam: 1.70 cm Armanda Magic MD Electronically signed by Armanda Magic MD Signature Date/Time: 04/01/2023/4:18:07 PM    Final    [place] CHEST (PLEURAL EFFUSION) Result Date: 03/31/2023 CLINICAL DATA:  Patient admitted with acute CHF exacerbation with new right pleural effusion. Request for diagnostic and therapeutic thoracentesis. EXAM: CHEST ULTRASOUND COMPARISON:  CXR 03/30/23, CTA chest 03/30/23 FINDINGS: Examination of the left chest shows no pleural fluid. Examination of the right chest shows minimal pleural effusion which is intermittently obscured by lung during respiration. No safe window for thoracentesis. IMPRESSION: Minimal right pleural effusion with no safe window for thoracentesis. No procedure was performed. Electronically Signed   By: Acquanetta Belling M.D.   On: 03/31/2023 11:09        Scheduled Meds:  allopurinol  100 mg Oral Daily   aspirin EC  81 mg Oral Daily   colchicine  0.6 mg Oral Daily   digoxin  0.125 mg Oral Daily   insulin aspart  0-15 Units Subcutaneous TID WC   insulin aspart  0-5 Units Subcutaneous QHS   losartan  12.5 mg Oral Daily   memantine  20 mg Oral QPM   oxybutynin  5 mg Oral Daily   pravastatin  20 mg Oral QPM   sodium chloride flush  3 mL Intravenous Q12H   spironolactone  12.5 mg Oral Daily   Continuous Infusions:     LOS: 2 days     Silvano Bilis, MD [facility]   If 7PM-7AM, please contact  night-coverage [email or web address] Password [facility] 04/02/2023, 10:49 AM

## 2023-04-02 NOTE — Plan of Care (Signed)
  Problem: Clinical Measurements: Goal: Ability to maintain clinical measurements within normal limits will improve Outcome: Progressing   Problem: Pain Management: Goal: General experience of comfort will improve Outcome: Progressing   Problem: Safety: Goal: Ability to remain free from injury will improve Outcome: Progressing

## 2023-04-02 NOTE — Plan of Care (Signed)

## 2023-04-03 DIAGNOSIS — I5023 Acute on chronic systolic (congestive) heart failure: Secondary | ICD-10-CM | POA: Diagnosis not present

## 2023-04-03 LAB — BASIC METABOLIC PANEL
Anion gap: 9 (ref 5–15)
BUN: 18 mg/dL (ref 8–23)
CO2: 25 mmol/L (ref 22–32)
Calcium: 8.6 mg/dL — ABNORMAL LOW (ref 8.9–10.3)
Chloride: 106 mmol/L (ref 98–111)
Creatinine, Ser: 0.93 mg/dL (ref 0.44–1.00)
GFR, Estimated: >60 mL/min (ref >60)
Glucose, Bld: 116 mg/dL — ABNORMAL HIGH (ref 70–99)
Potassium: 3.9 mmol/L (ref 3.5–5.1)
Sodium: 140 mmol/L (ref 135–145)

## 2023-04-03 LAB — GLUCOSE, CAPILLARY
Glucose-Capillary: 125 mg/dL — ABNORMAL HIGH (ref 70–99)
Glucose-Capillary: 192 mg/dL — ABNORMAL HIGH (ref 70–99)

## 2023-04-03 LAB — MAGNESIUM: Magnesium: 1.8 mg/dL (ref 1.7–2.4)

## 2023-04-03 MED ORDER — DIGOXIN 125 MCG PO TABS
0.0625 mg | ORAL_TABLET | Freq: Every day | ORAL | Status: DC
Start: 2023-04-03 — End: 2023-04-03
  Filled 2023-04-03: qty 0.5

## 2023-04-03 MED ORDER — SPIRONOLACTONE 25 MG PO TABS: 12.5000 mg | ORAL_TABLET | Freq: Every day | ORAL | 0 refills | Status: DC

## 2023-04-03 MED ORDER — LOSARTAN POTASSIUM 25 MG PO TABS: 12.5000 mg | ORAL_TABLET | Freq: Every day | ORAL | 0 refills | Status: DC

## 2023-04-03 MED ORDER — ASPIRIN 81 MG PO TBEC: 81.0000 mg | DELAYED_RELEASE_TABLET | Freq: Every day | ORAL | 0 refills | Status: DC

## 2023-04-03 MED ORDER — FUROSEMIDE 20 MG PO TABS
20.0000 mg | ORAL_TABLET | Freq: Every day | ORAL | Status: DC
  Administered 2023-04-03: 20 mg via ORAL
  Filled 2023-04-03: qty 1

## 2023-04-03 NOTE — Progress Notes (Signed)
Heart Failure Navigator Progress Note  Assessed for Heart & Vascular TOC clinic readiness.  Patient is now an Advanced Heart Failure Team patient of [facility].  Navigator will sign off at this time.  Roxy Horseman, RN, BSN [facility] Heart Failure Navigator Secure Chat Only

## 2023-04-03 NOTE — Discharge Summary (Signed)
Physician Discharge Summary   Patient: [name] [name] MRN: [id number]  DOB: 13-Jan-1936   Admit:     Date of Admission: 03/30/2023 Admitted from: home   Discharge: Date of discharge: 04/03/23  Disposition: Home health Condition at discharge: fair  CODE STATUS: DNR     Discharge Physician: Sunnie Nielsen, DO [facility]     PCP: Lynnea Ferrier, MD  Recommendations for Outpatient Follow-up:  Follow up with PCP Curtis Sites III, MD in 1-2 weeks Follow up with cardiology as directed Please obtain labs/tests: BMP, CBC in 1 week    Discharge Instructions     (HEART FAILURE PATIENTS) Call MD:  Anytime you have any of the following symptoms: 1) 3 pound weight gain in 24 hours or 5 pounds in 1 week 2) shortness of breath, with or without a dry hacking cough 3) swelling in the hands, feet or stomach 4) if you have to sleep on extra pillows at night in order to breathe.   Complete by: As directed    Ambulatory referral to Cardiology   Complete by: As directed    If you have not heard from the Cardiology office within the next 72 hours please call [phone].   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 Most recent echo about a year ago  03/30/2022 showed LVEF of 30%.  Patient presents to ED 12/19 via EMS from [facility] assisted living facility with dizziness, leg swelling, generalized malaise.   Hospital course / significant events:  12/20: admitted for CHF, cardiology consulted. Small R pleural effusion, IR eval and will defer thoracentesis. Plan echo. Diuresing.  12/21: Echo EF 20-25%, severe decreased LV function w/ global hypokinesis and Grade III diastolic dysfunction, RV also severely reduced fxn, severe TR, recs for R/L heart cath 12/22: cardiology d/w patient, declined cardiac cath 12/23: pt stable, eager for discharge home, per cardiology she is cleared from their perspective to DC and follow outpatient   Consultants:  Cardiology - Advanced Heart Failure Team   Procedures/Surgeries: none      ASSESSMENT & PLAN:   Acute on chronic biventricular heart failure Coronary Artery Disease  Essential hypertension  Echo this admission w/ severe LV dysfunction EF 20 to 25% with grade 3 DD, severe RV dysfunction, mild MR, severe TR.  Pt declines cardiac cath Continue ASA, Statin  Diuresis w/ Lasix 20 mg daily  GDMT per cardiology / heart failure team: losartan 12.5 mg daily, spironolactone 12.5 mg daily, will not add beta Snee given soft BP, will not add SGLT2i given age and increased UTI risk Digoxin 0.125 mg daily  D/c home benazeril and hydrochlorothiazide Consider outpatient AL workup/PYP scan  Pleural effusion, right Lymphadenopathy, chest Moderate on cxr, probably 2/2 chf though with multiple enlarged lymph nodes in the chest as well. IR consulted, u/s shows small effusion too small to tap diuresis as above Consider outpt repeat imaging of chest in 3 months per radiology   Hx CVA LDL less than 70 continue home asa, statin   T2DM Glucose appropriate. A1c 6.4 Resume home metformin  Per cardiology, would not add SGLT2i given age and increased UTI risk   Gout No exacerbation home allopurinol and  colchicine   Debility Resides at [facility], ambulates with a walker. PT consulted,  HH PT           Discharge Instructions  Allergies as of 04/03/2023       Reactions   Codeine Other (See Comments)   "I go crazy"   Etodolac    Other reaction(s): Unknown   Indomethacin    Other reaction(s): Unknown   Penicillins Other (See Comments)   Does not remember this allergy Has patient had a PCN reaction causing immediate rash, facial/tongue/throat swelling, SOB or lightheadedness with hypotension: Unknown Has patient had a PCN reaction causing severe rash involving mucus membranes or skin necrosis: Unknown Has patient had a PCN reaction that required hospitalization: Unknown Has patient had a PCN reaction occurring within the last 10 years: Unknown If all of the above answers are "NO", then may proceed with Cephalosporin use.   Tizanidine    Other reaction(s): Syncope Fell on the floor    Tramadol    Other reaction(s): Unknown        Medication List     STOP taking these medications    aspirin 81 MG tablet Replaced by: aspirin EC 81 MG tablet   benazepril-hydrochlorthiazide 20-12.5 MG tablet Commonly known as: LOTENSIN HCT   oxybutynin 5 MG 24 hr tablet Commonly known as: DITROPAN-XL       TAKE these medications    allopurinol 100 MG tablet Commonly known as: ZYLOPRIM Take 1 tablet by mouth daily. What changed: Another medication with the same name was removed. Continue taking this medication, and follow the directions you see here.   aspirin EC 81 MG tablet Take 1 tablet (81 mg total) by mouth daily. Swallow whole. Start taking on: April 04, 2023 Replaces: aspirin 81 MG tablet   colchicine 0.6 MG tablet Take 0.6 mg by mouth daily.   furosemide 20 MG tablet Commonly known as: Lasix Take 1 tablet (20 mg total) by mouth daily.   Loperamide A-D 2 MG tablet Generic drug: loperamide Take 2 mg by mouth as needed for diarrhea or  loose stools.   losartan 25 MG tablet Commonly known as: COZAAR Take 0.5 tablets (12.5 mg total) by mouth daily. Start taking on: April 04, 2023   memantine 10 MG tablet Commonly known as: NAMENDA Take 2 tablets by mouth every evening.   metFORMIN 1000 MG tablet Commonly known as: GLUCOPHAGE Take 1 tablet by mouth 2 (two) times daily. Take 1 tablet in the morning and 1 tablet in the evening   naproxen sodium 220 MG tablet Commonly known as: ALEVE Take 220 mg by mouth daily as needed.   polyethylene glycol 17 g packet Commonly known as: MIRALAX / GLYCOLAX Take 17 g by mouth daily as needed (Constipation).   pravastatin 20 MG tablet Commonly known as: PRAVACHOL Take 1 tablet by mouth every evening.   sodium chloride 0.65 % Soln nasal spray Commonly known as: OCEAN Place 1 spray into  both nostrils as needed for congestion.   spironolactone 25 MG tablet Commonly known as: ALDACTONE Take 0.5 tablets (12.5 mg total) by mouth daily. Start taking on: April 04, 2023         Follow-up Information     [facility].   Specialty: Cardiology Contact information: [address] [place] [address] [phone]                Allergies  Allergen Reactions   Codeine Other (See Comments)    "I go crazy"   Etodolac     Other reaction(s): Unknown   Indomethacin     Other reaction(s): Unknown   Penicillins Other (See Comments)    Does not remember this allergy Has patient had a PCN reaction causing immediate rash, facial/tongue/throat swelling, SOB or lightheadedness with hypotension: Unknown Has patient had a PCN reaction causing severe rash involving mucus membranes or skin necrosis: Unknown Has patient had a PCN reaction that required hospitalization: Unknown Has patient had a PCN reaction occurring within the last 10 years: Unknown If all of the above answers are "NO", then may proceed  with Cephalosporin use.    Tizanidine     Other reaction(s): Syncope Fell on the floor    Tramadol     Other reaction(s): Unknown     Subjective: pt reports feeling well today, no chest pain or SOB, she is eager for discharge home. No dizziness/lightheadedness, tolerating diet, ambulating   Discharge Exam: BP 110/76 (BP Location: Right Arm)   Pulse 83   Temp 97.9 F (36.6 C)   Resp 13   Ht 5\' 5"  (1.651 m)   Wt 68.9 kg   SpO2 99%   BMI 25.29 kg/m  General: Pt is alert, awake, not in acute distress Cardiovascular: RRR, S1/S2 +, no rubs, no gallops Respiratory: CTA bilaterally, no wheezing, no rhonchi Abdominal: Soft, NT, ND, bowel sounds + Extremities: no edema, no cyanosis     The results of significant diagnostics from this hospitalization (including imaging, microbiology, ancillary and laboratory) are listed below for reference.     Microbiology: No results found for this or any previous visit (from the past 240 hours).   Labs: BNP (last 3 results) Recent Labs    03/30/23 2108  BNP 1,313.4*   Basic Metabolic Panel: Recent Labs  Lab 03/30/23 1635 03/31/23 0503 04/01/23 0418 04/02/23 0422 04/03/23 0429  NA 135 139 138 138 140  K 3.8 3.4* 2.8* 3.6 3.9  CL 100 99 97* 99 106  CO2 19* 23 28 26 25   GLUCOSE 133* 119* 93 121* 116*  BUN 15 13 16 17 18   CREATININE 1.16* 1.13* 1.15* 1.26* 0.93  CALCIUM 8.7* 8.8* 8.7* 8.6* 8.6*  MG  --  1.5* 1.9  --  1.8   Liver Function Tests: Recent Labs  Lab 03/31/23 0512  AST 28  ALT 14  ALKPHOS 67  BILITOT 1.3*  PROT 6.9  ALBUMIN 3.6   No results for input(s): "LIPASE", "AMYLASE" in the last 168 hours. No results for input(s): "AMMONIA" in the last 168 hours. CBC: Recent Labs  Lab 03/30/23 1635 03/31/23 0503  WBC 6.4 5.7  NEUTROABS  --  4.2  HGB 12.5 12.7  HCT 37.6 37.4  MCV 92.6 91.9  PLT 291 262   Cardiac Enzymes: No results for input(s): "CKTOTAL", "CKMB", "CKMBINDEX", "TROPONINI" in the last 168  hours. BNP: Invalid input(s): "POCBNP" CBG: Recent Labs  Lab 04/02/23  1139 04/02/23 1545 04/02/23 2114 04/03/23 0837 04/03/23 1156  GLUCAP 168* 150* 143* 125* 192*   D-Dimer No results for input(s): "DDIMER" in the last 72 hours. Hgb A1c No results for input(s): "HGBA1C" in the last 72 hours. Lipid Profile Recent Labs    04/02/23 0422  CHOL 118  HDL 43  LDLCALC 60  TRIG 74  CHOLHDL 2.7   Thyroid function studies No results for input(s): "TSH", "T4TOTAL", "T3FREE", "THYROIDAB" in the last 72 hours.  Invalid input(s): "FREET3" Anemia work up No results for input(s): "VITAMINB12", "FOLATE", "FERRITIN", "TIBC", "IRON", "RETICCTPCT" in the last 72 hours. Urinalysis    Component Value Date/Time   COLORURINE YELLOW (A) 03/30/2023 2108   APPEARANCEUR CLEAR (A) 03/30/2023 2108   APPEARANCEUR Clear 03/12/2014 1000   LABSPEC 1.017 03/30/2023 2108   LABSPEC 1.018 03/12/2014 1000   PHURINE 5.0 03/30/2023 2108   GLUCOSEU NEGATIVE 03/30/2023 2108   GLUCOSEU Negative 03/12/2014 1000   HGBUR NEGATIVE 03/30/2023 2108   BILIRUBINUR NEGATIVE 03/30/2023 2108   BILIRUBINUR Negative 03/12/2014 1000   KETONESUR 5 (A) 03/30/2023 2108   PROTEINUR >=300 (A) 03/30/2023 2108   NITRITE NEGATIVE 03/30/2023 2108   LEUKOCYTESUR TRACE (A) 03/30/2023 2108   LEUKOCYTESUR Trace 03/12/2014 1000   Sepsis Labs Recent Labs  Lab 03/30/23 1635 03/31/23 0503  WBC 6.4 5.7   Microbiology No results found for this or any previous visit (from the past 240 hours). Imaging ECHOCARDIOGRAM COMPLETE Result Date: 04/01/2023    ECHOCARDIOGRAM REPORT   Patient Name:   [name] [name] Date of Exam: 04/01/2023 Medical Rec #:  [id number]     Height:       65.0 in Accession #:    [id number]    Weight:       152.0 lb Date of Birth:  [birth year]     BSA:          1.760 m Patient Age:    87 years      BP:           121/82 mmHg Patient Gender: F             HR:           96 bpm. Exam Location:  [facility] Procedure: 2D Echo  and 3D Echo Indications:     CHF I50.9  History:         Patient has no prior history of Echocardiogram examinations.  Sonographer:     Overton Mam RDCS, FASE Referring Phys:  [id number] Wilfred Curtis ONGE Diagnosing Phys: Armanda Magic MD IMPRESSIONS  1. Left ventricular ejection fraction, by estimation, is 20 to 25%. Left ventricular ejection fraction by 3D volume is 21 %. The left ventricle has severely decreased function. The left ventricle demonstrates global hypokinesis. Left ventricular diastolic parameters are consistent with Grade III diastolic dysfunction (restrictive). Elevated left ventricular end-diastolic pressure.  2. Right ventricular systolic function is severely reduced. The right ventricular size is mildly enlarged. Tricuspid regurgitation signal is inadequate for assessing PA pressure.  3. Left atrial size was mildly dilated.  4. Right atrial size was mild to moderately dilated.  5. The mitral valve is degenerative. Mild mitral valve regurgitation. No evidence of mitral stenosis.  6. Tricuspid valve regurgitation is severe.  7. The aortic valve is tricuspid. Aortic valve regurgitation is not visualized. Aortic valve sclerosis/calcification is present, without any evidence of aortic stenosis. Aortic valve Vmax measures 1.41 m/s.  8. The inferior vena cava is normal in size with <50% respiratory variability,  suggesting right atrial pressure of 8 mmHg. FINDINGS  Left Ventricle: Left ventricular ejection fraction, by estimation, is 20 to 25%. Left ventricular ejection fraction by 3D volume is 21 %. The left ventricle has severely decreased function. The left ventricle demonstrates global hypokinesis. The left ventricular internal cavity size was normal in size. There is no left ventricular hypertrophy. Left ventricular diastolic parameters are consistent with Grade III diastolic dysfunction (restrictive). Elevated left ventricular end-diastolic pressure. Right Ventricle: The right ventricular size is  mildly enlarged. No increase in right ventricular wall thickness. Right ventricular systolic function is severely reduced. Tricuspid regurgitation signal is inadequate for assessing PA pressure. Left Atrium: Left atrial size was mildly dilated. Right Atrium: Right atrial size was mild to moderately dilated. Pericardium: There is no evidence of pericardial effusion. Mitral Valve: The mitral valve is degenerative in appearance. There is mild thickening of the mitral valve leaflet(s). Mild mitral valve regurgitation. No evidence of mitral valve stenosis. Tricuspid Valve: The tricuspid valve is normal in structure. Tricuspid valve regurgitation is severe. No evidence of tricuspid stenosis. Aortic Valve: The aortic valve is tricuspid. Aortic valve regurgitation is not visualized. Aortic valve sclerosis/calcification is present, without any evidence of aortic stenosis. Aortic valve peak gradient measures 8.0 mmHg. Pulmonic Valve: The pulmonic valve was normal in structure. Pulmonic valve regurgitation is not visualized. No evidence of pulmonic stenosis. Aorta: The aortic root is normal in size and structure. Venous: The inferior vena cava is normal in size with less than 50% respiratory variability, suggesting right atrial pressure of 8 mmHg. IAS/Shunts: No atrial level shunt detected by color flow Doppler.  LEFT VENTRICLE PLAX 2D LVIDd:         4.00 cm         Diastology LVIDs:         3.40 cm         LV e' medial:    5.00 cm/s LV PW:         1.40 cm         LV E/e' medial:  15.9 LV IVS:        2.20 cm         LV e' lateral:   5.98 cm/s LVOT diam:     1.70 cm         LV E/e' lateral: 13.3 LV SV:         26 LV SV Index:   15 LVOT Area:     2.27 cm        3D Volume EF                                LV 3D EF:    Left                                             ventricul LV Volumes (MOD)                            ar LV vol d, MOD    58.3 ml                    ejection A2C:  fraction LV  vol d, MOD    64.3 ml                    by 3D A4C:                                        volume is LV vol s, MOD    37.3 ml                    21 %. A2C: LV vol s, MOD    48.8 ml A4C:                           3D Volume EF: LV SV MOD A2C:   21.0 ml       3D EF:        21 % LV SV MOD A4C:   64.3 ml       LV EDV:       171 ml LV SV MOD BP:    19.2 ml       LV ESV:       136 ml                                LV SV:        35 ml RIGHT VENTRICLE RV Basal diam:  4.00 cm RV S prime:     8.49 cm/s TAPSE (M-mode): 1.2 cm LEFT ATRIUM             Index        RIGHT ATRIUM           Index LA diam:        4.20 cm 2.39 cm/m   RA Area:     19.00 cm LA Vol (A2C):   71.7 ml 40.73 ml/m  RA Volume:   56.50 ml  32.10 ml/m LA Vol (A4C):   55.4 ml 31.47 ml/m LA Biplane Vol: 68.3 ml 38.80 ml/m  AORTIC VALVE                 PULMONIC VALVE AV Area (Vmax): 1.14 cm     PV Vmax:        0.64 m/s AV Vmax:        141.00 cm/s  PV Peak grad:   1.6 mmHg AV Peak Grad:   8.0 mmHg     RVOT Peak grad: 1 mmHg LVOT Vmax:      71.10 cm/s LVOT Vmean:     44.100 cm/s LVOT VTI:       0.114 m  AORTA Ao Root diam: 2.90 cm Ao Asc diam:  3.00 cm MITRAL VALVE               TRICUSPID VALVE MV Area (PHT): 5.02 cm    TV Peak grad:   31.4 mmHg MV Decel Time: 151 msec    TV Vmax:        2.80 m/s MV E velocity: 79.70 cm/s MV A velocity: 40.40 cm/s  SHUNTS MV E/A ratio:  1.97        Systemic VTI:  0.11 m                            Systemic Diam:  1.70 cm Armanda Magic MD Electronically signed by Armanda Magic MD Signature Date/Time: 04/01/2023/4:18:07 PM    Final       Time coordinating discharge: over 30 minutes  SIGNED:  Sunnie Nielsen DO [facility]

## 2023-04-03 NOTE — Progress Notes (Incomplete)
PROGRESS NOTE    [name] [name]   ACZ:[id number] DOB: [birth year]  DOA: 03/30/2023 Date of Service: 04/03/23 which is hospital day 3  PCP: Lynnea Ferrier, MD    HPI: This 87 year old female with past medical history of T2DM, HLD, and HFrEF. Most recent echo about a year ago 03/30/2022 showed LVEF of 30%.  Patient presents to ED 12/19 via EMS from [facility] assisted living facility with dizziness, leg swelling, generalized malaise.   Hospital course / significant events:  12/20: admitted for CHF, cardiology consulted. Small R pleural effusion, IR eval and will defer thoracentesis. Plan echo. Diuresing.  12/21: Echo EF 20-25%, severe decreased LV function w/ global hypokinesis and Grade III diastolic dysfunction, RV also severely reduced fxn, severe TR, recs for R/L heart cath 12/22: cardiology d/w patient, declined cardiac cath 12/23: ***  Consultants:  Cardiology - Advanced Heart Failure Team   Procedures/Surgeries: ***      ASSESSMENT & PLAN:   Acute on chronic biventricular heart failure Coronary Artery Disease  Essential hypertension  Echo this admission w/ severe LV dysfunction EF 20 to 25% with grade 3 DD, severe RV dysfunction, mild MR, severe TR.  Pt declines cardaic cath Continue ASA, Statin  Holding diuretics given elevation in creatinine (question I&O accuracy) GDMT per cardiology / heart failure team: losartan 12.5 mg daily, spironolactone 12.5 mg daily, will not add beta Siller given soft BP, will not add SGLT2i given age and increased UTI risk Digoxin 0.125 mg daily  D/c home benazeril and HCTZ  Pleural effusion, right Lymphadenopathy, chest Moderate on cxr, probably 2/2 chf though with multiple enlarged lymph nodes in the chest as well. IR consulted, u/s shows small effusion too small to tap diuresis as above Consider outpt repeat imaging of chest in 3 months per radiology   Hx CVA LDL less than 70 continue home asa, statin   T2DM Glucose  appropriate. A1c 6.4 SSI for now Per cardiology, would not add SGLT2i given age and increased UTI risk   Gout No exacerbation home allopurinol and colchicine   Debility Resides at [facility], ambulates with a walker. PT consulted,  HH PT    *** based on BMI: Body mass index is 25.29 kg/m.  Underweight - under 18  overweight - 25 to 29 obese - 30 or more Class 1 obesity: BMI of 30.0 to 34 Class 2 obesity: BMI of 35.0 to 39 Class 3 obesity: BMI of 40.0 to 49 Super Morbid Obesity: BMI 50-59 Super-super Morbid Obesity: BMI 60+ Significantly low or high BMI is associated with higher medical risk.  Weight management advised as adjunct to other disease management and risk reduction treatments    DVT prophylaxis: *** IV fluids: *** continuous IV fluids  Nutrition: *** Central lines / invasive devices: ***  Code Status: *** ACP documentation reviewed: *** none on file in [facility]  TOC needs: *** Barriers to dispo / significant pending items: ***             Subjective / Brief ROS:  Patient reports *** Denies CP/SOB.  Pain controlled.  Denies new weakness.  Tolerating diet ***.  Reports no concerns w/ urination/defecation.   Family Communication: ***    Objective Findings:  Vitals:   04/02/23 1137 04/02/23 1600 04/03/23 0555 04/03/23 0839  BP: 110/74 109/63 111/72 107/76  Pulse: 86 87 77 83  Resp: 16 16 16 20   Temp: 97.7 F (36.5 C) 98.1 F (36.7 C) (!) 97.5 F (36.4 C)  97.7 F (36.5 C)  TempSrc: Oral Oral Oral   SpO2: 100% 100% 100% 100%  Weight:      Height:       No intake or output data in the 24 hours ending 04/03/23 0959 Filed Weights   03/30/23 1631  Weight: 68.9 kg    Examination:  Physical Exam       Scheduled Medications:   allopurinol  100 mg Oral Daily   aspirin EC  81 mg Oral Daily   colchicine  0.6 mg Oral Daily   furosemide  20 mg Oral Daily   insulin aspart  0-15 Units Subcutaneous TID WC    insulin aspart  0-5 Units Subcutaneous QHS   losartan  12.5 mg Oral Daily   memantine  20 mg Oral QPM   oxybutynin  5 mg Oral Daily   pravastatin  20 mg Oral QPM   sodium chloride flush  3 mL Intravenous Q12H   spironolactone  12.5 mg Oral Daily    Continuous Infusions:   PRN Medications:  acetaminophen, ondansetron (ZOFRAN) IV, sodium chloride flush  Antimicrobials from admission:  Anti-infectives (From admission, onward)    None           Data Reviewed:  I have personally reviewed the following...  CBC: Recent Labs  Lab 03/30/23 1635 03/31/23 0503  WBC 6.4 5.7  NEUTROABS  --  4.2  HGB 12.5 12.7  HCT 37.6 37.4  MCV 92.6 91.9  PLT 291 262   Basic Metabolic Panel: Recent Labs  Lab 03/30/23 1635 03/31/23 0503 04/01/23 0418 04/02/23 0422 04/03/23 0429  NA 135 139 138 138 140  K 3.8 3.4* 2.8* 3.6 3.9  CL 100 99 97* 99 106  CO2 19* 23 28 26 25   GLUCOSE 133* 119* 93 121* 116*  BUN 15 13 16 17 18   CREATININE 1.16* 1.13* 1.15* 1.26* 0.93  CALCIUM 8.7* 8.8* 8.7* 8.6* 8.6*  MG  --  1.5* 1.9  --  1.8   GFR: Estimated Creatinine Clearance: 43.1 mL/min (by C-G formula based on SCr of 0.93 mg/dL). Liver Function Tests: Recent Labs  Lab 03/31/23 0512  AST 28  ALT 14  ALKPHOS 67  BILITOT 1.3*  PROT 6.9  ALBUMIN 3.6   No results for input(s): "LIPASE", "AMYLASE" in the last 168 hours. No results for input(s): "AMMONIA" in the last 168 hours. Coagulation Profile: No results for input(s): "INR", "PROTIME" in the last 168 hours. Cardiac Enzymes: No results for input(s): "CKTOTAL", "CKMB", "CKMBINDEX", "TROPONINI" in the last 168 hours. BNP (last 3 results) No results for input(s): "PROBNP" in the last 8760 hours. HbA1C: No results for input(s): "HGBA1C" in the last 72 hours. CBG: Recent Labs  Lab 04/02/23 0759 04/02/23 1139 04/02/23 1545 04/02/23 2114 04/03/23 0837  GLUCAP 154* 168* 150* 143* 125*   Lipid Profile: Recent Labs     04/02/23 0422  CHOL 118  HDL 43  LDLCALC 60  TRIG 74  CHOLHDL 2.7   Thyroid Function Tests: No results for input(s): "TSH", "T4TOTAL", "FREET4", "T3FREE", "THYROIDAB" in the last 72 hours. Anemia Panel: No results for input(s): "VITAMINB12", "FOLATE", "FERRITIN", "TIBC", "IRON", "RETICCTPCT" in the last 72 hours. Most Recent Urinalysis On File:     Component Value Date/Time   COLORURINE YELLOW (A) 03/30/2023 2108   APPEARANCEUR CLEAR (A) 03/30/2023 2108   APPEARANCEUR Clear 03/12/2014 1000   LABSPEC 1.017 03/30/2023 2108   LABSPEC 1.018 03/12/2014 1000   PHURINE 5.0 03/30/2023 2108   GLUCOSEU NEGATIVE  03/30/2023 2108   GLUCOSEU Negative 03/12/2014 1000   HGBUR NEGATIVE 03/30/2023 2108   BILIRUBINUR NEGATIVE 03/30/2023 2108   BILIRUBINUR Negative 03/12/2014 1000   KETONESUR 5 (A) 03/30/2023 2108   PROTEINUR >=300 (A) 03/30/2023 2108   NITRITE NEGATIVE 03/30/2023 2108   LEUKOCYTESUR TRACE (A) 03/30/2023 2108   LEUKOCYTESUR Trace 03/12/2014 1000   Sepsis Labs: @LABRCNTIP (procalcitonin:4,lacticidven:4) Microbiology: No results found for this or any previous visit (from the past 240 hours).    Radiology Studies last 3 days: ECHOCARDIOGRAM COMPLETE Result Date: 04/01/2023    ECHOCARDIOGRAM REPORT   Patient Name:   Stanley Fleischer Date of Exam: 04/01/2023 Medical Rec #:  [id number]     Height:       65.0 in Accession #:    [id number]    Weight:       152.0 lb Date of Birth:  03-04-1936     BSA:          1.760 m Patient Age:    87 years      BP:           121/82 mmHg Patient Gender: F             HR:           96 bpm. Exam Location:  [facility] Procedure: 2D Echo and 3D Echo Indications:     CHF I50.9  History:         Patient has no prior history of Echocardiogram examinations.  Sonographer:     Overton Mam RDCS, FASE Referring Phys:  [id number] Wilfred Curtis PIRJ Diagnosing Phys: Armanda Magic MD IMPRESSIONS  1. Left ventricular ejection fraction, by estimation, is 20 to 25%. Left  ventricular ejection fraction by 3D volume is 21 %. The left ventricle has severely decreased function. The left ventricle demonstrates global hypokinesis. Left ventricular diastolic parameters are consistent with Grade III diastolic dysfunction (restrictive). Elevated left ventricular end-diastolic pressure.  2. Right ventricular systolic function is severely reduced. The right ventricular size is mildly enlarged. Tricuspid regurgitation signal is inadequate for assessing PA pressure.  3. Left atrial size was mildly dilated.  4. Right atrial size was mild to moderately dilated.  5. The mitral valve is degenerative. Mild mitral valve regurgitation. No evidence of mitral stenosis.  6. Tricuspid valve regurgitation is severe.  7. The aortic valve is tricuspid. Aortic valve regurgitation is not visualized. Aortic valve sclerosis/calcification is present, without any evidence of aortic stenosis. Aortic valve Vmax measures 1.41 m/s.  8. The inferior vena cava is normal in size with <50% respiratory variability, suggesting right atrial pressure of 8 mmHg. FINDINGS  Left Ventricle: Left ventricular ejection fraction, by estimation, is 20 to 25%. Left ventricular ejection fraction by 3D volume is 21 %. The left ventricle has severely decreased function. The left ventricle demonstrates global hypokinesis. The left ventricular internal cavity size was normal in size. There is no left ventricular hypertrophy. Left ventricular diastolic parameters are consistent with Grade III diastolic dysfunction (restrictive). Elevated left ventricular end-diastolic pressure. Right Ventricle: The right ventricular size is mildly enlarged. No increase in right ventricular wall thickness. Right ventricular systolic function is severely reduced. Tricuspid regurgitation signal is inadequate for assessing PA pressure. Left Atrium: Left atrial size was mildly dilated. Right Atrium: Right atrial size was mild to moderately dilated. Pericardium:  There is no evidence of pericardial effusion. Mitral Valve: The mitral valve is degenerative in appearance. There is mild thickening of the mitral valve leaflet(s). Mild mitral valve regurgitation. No evidence  of mitral valve stenosis. Tricuspid Valve: The tricuspid valve is normal in structure. Tricuspid valve regurgitation is severe. No evidence of tricuspid stenosis. Aortic Valve: The aortic valve is tricuspid. Aortic valve regurgitation is not visualized. Aortic valve sclerosis/calcification is present, without any evidence of aortic stenosis. Aortic valve peak gradient measures 8.0 mmHg. Pulmonic Valve: The pulmonic valve was normal in structure. Pulmonic valve regurgitation is not visualized. No evidence of pulmonic stenosis. Aorta: The aortic root is normal in size and structure. Venous: The inferior vena cava is normal in size with less than 50% respiratory variability, suggesting right atrial pressure of 8 mmHg. IAS/Shunts: No atrial level shunt detected by color flow Doppler.  LEFT VENTRICLE PLAX 2D LVIDd:         4.00 cm         Diastology LVIDs:         3.40 cm         LV e' medial:    5.00 cm/s LV PW:         1.40 cm         LV E/e' medial:  15.9 LV IVS:        2.20 cm         LV e' lateral:   5.98 cm/s LVOT diam:     1.70 cm         LV E/e' lateral: 13.3 LV SV:         26 LV SV Index:   15 LVOT Area:     2.27 cm        3D Volume EF                                LV 3D EF:    Left                                             ventricul LV Volumes (MOD)                            ar LV vol d, MOD    58.3 ml                    ejection A2C:                                        fraction LV vol d, MOD    64.3 ml                    by 3D A4C:                                        volume is LV vol s, MOD    37.3 ml                    21 %. A2C: LV vol s, MOD    48.8 ml A4C:                           3D Volume EF:  LV SV MOD A2C:   21.0 ml       3D EF:        21 % LV SV MOD A4C:   64.3 ml       LV EDV:        171 ml LV SV MOD BP:    19.2 ml       LV ESV:       136 ml                                LV SV:        35 ml RIGHT VENTRICLE RV Basal diam:  4.00 cm RV S prime:     8.49 cm/s TAPSE (M-mode): 1.2 cm LEFT ATRIUM             Index        RIGHT ATRIUM           Index LA diam:        4.20 cm 2.39 cm/m   RA Area:     19.00 cm LA Vol (A2C):   71.7 ml 40.73 ml/m  RA Volume:   56.50 ml  32.10 ml/m LA Vol (A4C):   55.4 ml 31.47 ml/m LA Biplane Vol: 68.3 ml 38.80 ml/m  AORTIC VALVE                 PULMONIC VALVE AV Area (Vmax): 1.14 cm     PV Vmax:        0.64 m/s AV Vmax:        141.00 cm/s  PV Peak grad:   1.6 mmHg AV Peak Grad:   8.0 mmHg     RVOT Peak grad: 1 mmHg LVOT Vmax:      71.10 cm/s LVOT Vmean:     44.100 cm/s LVOT VTI:       0.114 m  AORTA Ao Root diam: 2.90 cm Ao Asc diam:  3.00 cm MITRAL VALVE               TRICUSPID VALVE MV Area (PHT): 5.02 cm    TV Peak grad:   31.4 mmHg MV Decel Time: 151 msec    TV Vmax:        2.80 m/s MV E velocity: 79.70 cm/s MV A velocity: 40.40 cm/s  SHUNTS MV E/A ratio:  1.97        Systemic VTI:  0.11 m                            Systemic Diam: 1.70 cm Armanda Magic MD Electronically signed by Armanda Magic MD Signature Date/Time: 04/01/2023/4:18:07 PM    Final    [place] CHEST (PLEURAL EFFUSION) Result Date: 03/31/2023 CLINICAL DATA:  Patient admitted with acute CHF exacerbation with new right pleural effusion. Request for diagnostic and therapeutic thoracentesis. EXAM: CHEST ULTRASOUND COMPARISON:  CXR 03/30/23, CTA chest 03/30/23 FINDINGS: Examination of the left chest shows no pleural fluid. Examination of the right chest shows minimal pleural effusion which is intermittently obscured by lung during respiration. No safe window for thoracentesis. IMPRESSION: Minimal right pleural effusion with no safe window for thoracentesis. No procedure was performed. Electronically Signed   By: Acquanetta Belling M.D.   On: 03/31/2023 11:09   CT Angio Chest PE W/Cm &/Or Wo Cm Result Date:  03/30/2023 CLINICAL DATA:  High probability pulmonary embolism, leg swelling  EXAM: CT ANGIOGRAPHY CHEST WITH CONTRAST TECHNIQUE: Multidetector CT imaging of the chest was performed using the standard protocol during bolus administration of intravenous contrast. Multiplanar CT image reconstructions and MIPs were obtained to evaluate the vascular anatomy. RADIATION DOSE REDUCTION: This exam was performed according to the departmental dose-optimization program which includes automated exposure control, adjustment of the mA and/or kV according to patient size and/or use of iterative reconstruction technique. CONTRAST:  75mL OMNIPAQUE IOHEXOL 350 MG/ML SOLN COMPARISON:  None Available. FINDINGS: Cardiovascular: There is adequate opacification of the pulmonary arterial tree. There is poor enhancement of the right lower lobar pulmonary arterial vasculature which relates to collapse of the right lower lobe pulmonary vascular redistribution. No intraluminal filling defects identified to suggest acute pulmonary embolism. The central pulmonary arteries are normal caliber. Extensive multi-vessel coronary artery calcification is identified. Global cardiac size is enlarged with biventricular enlargement noted. There is reflux of contrast into the hepatic venous system in keeping with of the some degree of right heart failure. No pericardial effusion. Moderate atherosclerotic calcification within the thoracic aorta. No aortic aneurysm. Mediastinum/Nodes: There is pathologic enlargement of right hilar, right paratracheal, and subcarinal lymph nodes with multiple lymph nodes measuring 18-22 mm in short axis diameter. This appears new since prior examination. Visualized thyroid is unremarkable. Esophagus is unremarkable. Lungs/Pleura: Moderate right pleural effusion is present with compressive atelectasis of the dependent right lung. Superimposed back glass 1 bilaterally likely relates to atelectasis. No pneumothorax. No pleural  effusion left. No central obstructing lesion. Upper Abdomen: No acute abnormality.  Trace ascites Musculoskeletal: No chest wall abnormality. No acute or significant osseous findings. Review of the MIP images confirms the above findings. IMPRESSION: 1. No pulmonary embolism. 2. Moderate right pleural effusion with compressive atelectasis of the dependent right lung. No central obstructing lesion. 3. Cardiomegaly with reflux of contrast into the hepatic venous system in keeping with some degree of right heart failure. 4. Extensive multi-vessel coronary artery calcification. 5. Pathologic enlargement of right hilar, right paratracheal, and subcarinal lymph nodes, new since prior examination. Differential considerations include inflammatory conditions such as sarcoidosis, granulomatous infection, nodal metastatic disease, or lymphoproliferative disorders. Follow-up CT or PET CT examination is recommended 3 months to demonstrate resolution or direct potential biopsy strategy. 6. Trace ascites. Aortic Atherosclerosis (ICD10-I70.0). Electronically Signed   By: Helyn Numbers M.D.   On: 03/30/2023 23:42   DG Chest Port 1 View Result Date: 03/30/2023 CLINICAL DATA:  Dizziness EXAM: PORTABLE CHEST 1 VIEW COMPARISON:  02/01/2016 FINDINGS: Cardiac shadow is enlarged. Aortic calcifications are noted. The lungs are well aerated bilaterally. Small right pleural effusion is seen. No bony abnormality is noted. IMPRESSION: Small right pleural effusion. Electronically Signed   By: Alcide Clever M.D.   On: 03/30/2023 23:09       Time spent: ***    Sunnie Nielsen, DO [facility] 04/03/2023, 9:59 AM    Dictation software may have been used to generate the above note. Typos may occur and escape review in typed/dictated notes. Please contact Dr Lyn Hollingshead directly for clarity if needed.  Staff may message me via secure chat in Epic  but this may not receive an immediate response,  please page me for urgent  matters!  If 7PM-7AM, please contact night coverage [email or web address]

## 2023-04-03 NOTE — Care Management Important Message (Signed)
Important Message  Patient Details  Name: [name] [name] MRN: [id number] Date of Birth: 21-Sep-1935   Important Message Given:  Yes - Medicare IM     Bernadette Hoit 04/03/2023, 11:57 AM

## 2023-04-03 NOTE — Hospital Course (Addendum)
HPI: This 87 year old female with past medical history of T2DM, HLD, and HFrEF. Most recent echo about a year ago 03/30/2022 showed LVEF of 30%.  Patient presents to ED 12/19 via EMS from [facility] assisted living facility with dizziness, leg swelling, generalized malaise.   Hospital course / significant events:  12/20: admitted for CHF, cardiology consulted. Small R pleural effusion, IR eval and will defer thoracentesis. Plan echo. Diuresing.  12/21: Echo EF 20-25%, severe decreased LV function w/ global hypokinesis and Grade III diastolic dysfunction, RV also severely reduced fxn, severe TR, recs for R/L heart cath 12/22: cardiology d/w patient, declined cardiac cath 12/23: pt stable, eager for discharge home, per cardiology she is cleared from their perspective to DC and follow outpatient   Consultants:  Cardiology - Advanced Heart Failure Team   Procedures/Surgeries: none      ASSESSMENT & PLAN:   Acute on chronic biventricular heart failure Coronary Artery Disease  Essential hypertension  Echo this admission w/ severe LV dysfunction EF 20 to 25% with grade 3 DD, severe RV dysfunction, mild MR, severe TR.  Pt declines cardiac cath Continue ASA, Statin  Diuresis w/ Lasix 20 mg daily  GDMT per cardiology / heart failure team: losartan 12.5 mg daily, spironolactone 12.5 mg daily, will not add beta [place] given soft BP, will not add SGLT2i given age and increased UTI risk Digoxin 0.125 mg daily  D/c home benazeril and hydrochlorothiazide Consider outpatient AL workup/PYP scan   Pleural effusion, right Lymphadenopathy, chest Moderate on cxr, probably 2/2 chf though with multiple enlarged lymph nodes in the chest as well. IR consulted, u/s shows small effusion too small to tap diuresis as above Consider outpt repeat imaging of chest in 3 months per radiology   Hx CVA LDL less than 70 continue home asa, statin   T2DM Glucose appropriate. A1c 6.4 SSI for now Per  cardiology, would not add SGLT2i given age and increased UTI risk   Gout No exacerbation home allopurinol and colchicine   Debility Resides at [facility], ambulates with a walker. PT consulted,  HH PT    *** based on BMI: Body mass index is 25.29 kg/m.  Underweight - under 18  overweight - 25 to 29 obese - 30 or more Class 1 obesity: BMI of 30.0 to 34 Class 2 obesity: BMI of 35.0 to 39 Class 3 obesity: BMI of 40.0 to 49 Super Morbid Obesity: BMI 50-59 Super-super Morbid Obesity: BMI 60+ Significantly low or high BMI is associated with higher medical risk.  Weight management advised as adjunct to other disease management and risk reduction treatments    DVT prophylaxis: *** IV fluids: *** continuous IV fluids  Nutrition: *** Central lines / invasive devices: ***  Code Status: *** ACP documentation reviewed: *** none on file in [facility]  TOC needs: *** Barriers to dispo / significant pending items: ***

## 2023-04-03 NOTE — Progress Notes (Signed)
Physical Therapy Treatment Patient Details Name: [name] [name] MRN: [id number] DOB: 01-Dec-1935 Today's Date: 04/03/2023   History of Present Illness This 87 year old female with past medical history of T2DM, HLD, and HFrEF.  Most recent echo done 03/30/2022 showed LVEF of 30%.  Patient presents with  complaints dizziness, leg swelling, generalized malaise.  She denies chest pain.  She endorses some mild shortness of breath.  She was brought to the ER.    PT Comments  Patient agreeable to PT session. No dizziness reported with mobility today. She ambulated with and without rolling walker. Gait pattern, balance, and safety improved with rolling walker. Recommend to continue PT to maximize independence. She reports she is hopeful for discharge home soon.    If plan is discharge home, recommend the following: A little help with walking and/or transfers;A little help with bathing/dressing/bathroom;Assist for transportation;Help with stairs or ramp for entrance   Can travel by private vehicle        Equipment Recommendations  None recommended by PT    Recommendations for Other Services       Precautions / Restrictions Precautions Precautions: Fall Restrictions Weight Bearing Restrictions Per Provider Order: No     Mobility  Bed Mobility Overal bed mobility: Modified Independent             General bed mobility comments: supine to short sitting    Transfers Overall transfer level: Needs assistance Equipment used: Rolling walker (2 wheels) Transfers: Sit to/from Stand Sit to Stand: Supervision, Contact guard assist           General transfer comment: CGA for standing, supervision for second stand.    Ambulation/Gait Ambulation/Gait assistance: Contact guard assist, Supervision Gait Distance (Feet):  (40ft, 25 ft) Assistive device: Rolling walker (2 wheels) Gait Pattern/deviations: Step-through pattern Gait velocity: decreased     General Gait Details: short  distance ambulation without rolling walker with CGA required and patient reaching for furniture for support. recommend to use rolling walker for safety with ambulation for improved gait pattern and balance. no dizziness reported with mobility   Stairs             Wheelchair Mobility     Tilt Bed    Modified Rankin (Stroke Patients Only)       Balance Overall balance assessment: Needs assistance Sitting-balance support: Feet supported Sitting balance-Leahy Scale: Good     Standing balance support: Bilateral upper extremity supported Standing balance-Leahy Scale: Fair                              Cognition Arousal: Alert Behavior During Therapy: WFL for tasks assessed/performed Overall Cognitive Status: Within Functional Limits for tasks assessed                                          Exercises      General Comments        Pertinent Vitals/Pain Pain Assessment Pain Assessment: Faces Faces Pain Scale: Hurts a little bit Pain Location: L knee Pain Descriptors / Indicators: Discomfort (stiff) Pain Intervention(s): Monitored during session, Repositioned, Limited activity within patient's tolerance    Home Living                          Prior Function  PT Goals (current goals can now be found in the care plan section) Acute Rehab PT Goals Patient Stated Goal: to get stronger PT Goal Formulation: With patient Time For Goal Achievement: 04/07/23 Progress towards PT goals: Progressing toward goals    Frequency    Min 1X/week      PT Plan      Co-evaluation              AM-PAC PT "6 Clicks" Mobility   Outcome Measure  Help needed turning from your back to your side while in a flat bed without using bedrails?: None Help needed moving from lying on your back to sitting on the side of a flat bed without using bedrails?: None Help needed moving to and from a bed to a chair (including a  wheelchair)?: A Little Help needed standing up from a chair using your arms (e.g., wheelchair or bedside chair)?: A Little Help needed to walk in hospital room?: A Little Help needed climbing 3-5 steps with a railing? : A Little 6 Click Score: 20    End of Session   Activity Tolerance: Patient tolerated treatment well Patient left: in bed;with call bell/phone within reach;with bed alarm set (seated on edge of bed per patient preference) Nurse Communication: Mobility status PT Visit Diagnosis: Muscle weakness (generalized) (M62.81);Other abnormalities of gait and mobility (R26.89)     Time: 5284-1324 PT Time Calculation (min) (ACUTE ONLY): 15 min  Charges:    $Therapeutic Activity: 8-22 mins PT General Charges $$ ACUTE PT VISIT: 1 Visit                     Donna Bernard, PT, MPT    Ina Homes 04/03/2023, 11:17 AM

## 2023-04-03 NOTE — TOC Transition Note (Addendum)
Transition of Care [facility]) - Progression Note    Patient Details  Name: [name] [name] MRN: [id number] Date of Birth: [birth year]  Transition of Care [facility]) CM/SW Contact  Truddie Hidden, RN Phone Number: 04/03/2023, 1:55 PM  Clinical Narrative:    Spoke with patient's Daughter, [name] [name] regarding discharge today. [name] [name] would like to proceed with [facility] PT via [facility] and not facility [facility] via [facility]. Patient's grandaughter's [name] [name] and [name] [name] will transport her home today. [name] [name] advised a representative from [name] [name] would contact her to schedule SOC. She requested if she is unable to be reach to contact [place] @ [phone] or [name] [name] at [phone].   Attempt to contact [facility] at [facility], no answer.  [name] [name] from [place] notified of discharge today and updated with contact.   TOC signing off.          Expected Discharge Plan and Services         Expected Discharge Date: 04/03/23                                     Social Determinants of Health (SDOH) Interventions SDOH Screenings   Food Insecurity: No Food Insecurity (04/01/2023)  Housing: Unknown (04/01/2023)  Transportation Needs: No Transportation Needs (04/01/2023)  Utilities: Not At Risk (04/01/2023)  Financial Resource Strain: Low Risk  (02/21/2023)   Received from [facility]  Tobacco Use: Medium Risk (03/30/2023)    Readmission Risk Interventions     No data to display

## 2023-04-03 NOTE — Progress Notes (Signed)
    Advanced Heart Failure Rounding Note  PCP-Cardiologist: None   Subjective:     Wants to go home today, breathing better, has not been out of bed much.   Objective:   Weight Range: 68.9 kg Body mass index is 25.29 kg/m.   Vital Signs:   Temp:  [97.5 F (36.4 C)-98.1 F (36.7 C)] 97.7 F (36.5 C) (12/23 0839) Pulse Rate:  [77-87] 83 (12/23 0839) Resp:  [16-20] 20 (12/23 0839) BP: (107-111)/(63-76) 107/76 (12/23 0839) SpO2:  [100 %] 100 % (12/23 0839)    Weight change: Filed Weights   03/30/23 1631  Weight: 68.9 kg    Intake/Output:  No intake or output data in the 24 hours ending 04/03/23 0952    Physical Exam    General:  Chronically ill appearing HEENT: NCAT Cor:  Regular rate & rhythm. No rubs, gallops or murmurs. JVP not elevated. Trace LE edema Lungs: Normal work of breathing, clear to auscultation bilaterally Abdomen: soft, nontender, nondistended Extremities: no cyanosis, clubbing, rash Neuro: alert & orientedx3, cranial nerves grossly intact. moves all 4 extremities w/o difficulty. Affect pleasant Vascular: 2+ radial pulses   Patient Profile   [name] [name] is a 87 y.o. 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 She appears euvolemic and given her fall risk would attempt to avoid over diuresis. Will stop digoxin given her age and multiple comorbidities. -Lasix 20 mg daily -Not interested in right heart catheterization at this time which is reasonable   2. Chronic systolic heart failure: LVH with severely reduced ejection fraction. Given her severely reduced e' and echocardiographic appearance, ruling out amyloid would be reasonable. Given age is unlikely to be AL so can complete lab workup in clinic. - Continue low dose GDMT for now -Consider  outpatient AL workup/PYP scan - Continue losartan 12.5 and spironolactone 12.5 daily   3. Dizziness -Orthostatic vitals fine, likely due to biventricular failure versus potential autonomic neuropathy  Reasonable to discharge today from a cardiovascular standpoint  Recommendations:  -Lasix 20 mg daily, losartan 12.5 mg daily, and spironolactone 12.5 mg daily -Aspirin 81 mg daily -Pravastatin 20 mg daily  Length of Stay: 3  Romie Minus, MD  04/03/2023, 9:52 AM  Advanced Heart Failure Team Pager [phone] (M-F; 7a - 5p)  Please contact [facility] for night-coverage after hours (5p -7a ) and weekends on [email or web address]

## 2023-05-05 ENCOUNTER — Telehealth: Payer: Self-pay | Admitting: Emergency Medicine

## 2023-05-05 ENCOUNTER — Telehealth: Payer: Self-pay | Admitting: Physician Assistant

## 2023-05-05 MED ORDER — FUROSEMIDE 20 MG PO TABS: 20.0000 mg | ORAL_TABLET | Freq: Every day | ORAL | 1 refills | Status: DC

## 2023-05-05 NOTE — Telephone Encounter (Signed)
Patient is followed by the advanced heart failure service.  Appointment with me needs to be canceled and appointment needs to be moved into their schedule.  I will defer refills to them.

## 2023-05-05 NOTE — Telephone Encounter (Signed)
Pt has an upcoming appt with you on 05/08/23. Pt is not an establish pt with our office. Would you like to refill pt's medication for furosemide 20 mg until appt time? Please address

## 2023-05-05 NOTE — Addendum Note (Signed)
Addended by: Theresia Bough on: 05/05/2023 03:34 PM   Modules accepted: Orders

## 2023-05-05 NOTE — Telephone Encounter (Signed)
Heart Failure clinic called and appt made for pt 28 Jan at 11:45  Unable to reach pt by phone-  rings 3 times then disconnects, similar with daughter's phone but granddaughter Danelle Earthly), reports that pt is not wanting to come to any appts, counsel provided in regards to empowering pt and supporting decisions and independence to support pt's desires to stay out of the ED and hospital  Requests for med refill as been forwarded to Heart Failure clinic

## 2023-05-05 NOTE — Telephone Encounter (Signed)
Will refill to ensure pt does not run out of meds prior to upcoming appt at HF clinic

## 2023-05-05 NOTE — Telephone Encounter (Signed)
*  STAT* If patient is at the pharmacy, call can be transferred to refill team.   1. Which medications need to be refilled? (please list name of each medication and dose if known)   furosemide (LASIX) 20 MG tablet (Expired)   2. Would you like to learn more about the convenience, safety, & potential cost savings by using the [facility]?   3. Are you open to using the [facility] (Type [facility]. ).  4. Which pharmacy/location (including street and city if local pharmacy) is medication to be sent to?  [id number] - [place], [place] - [address]   5. Do they need a 30 day or 90 day supply?   90 day  Daughter Inocencio Homes) stated patient has been out of this medication for over a week.  Patient has appointment scheduled on 1/27.

## 2023-05-08 ENCOUNTER — Ambulatory Visit: Payer: Medicare Other | Admitting: Physician Assistant

## 2023-05-09 ENCOUNTER — Encounter: Payer: BC Managed Care – PPO | Admitting: Cardiology

## 2023-05-23 ENCOUNTER — Encounter: Payer: BC Managed Care – PPO | Admitting: Cardiology

## 2023-05-23 DIAGNOSIS — N1831 Chronic kidney disease, stage 3a: Secondary | ICD-10-CM | POA: Diagnosis present

## 2023-05-30 ENCOUNTER — Telehealth: Payer: Self-pay | Admitting: Cardiology

## 2023-05-30 NOTE — Telephone Encounter (Signed)
Pt daughter called to cancel appt

## 2023-06-05 ENCOUNTER — Encounter: Payer: BC Managed Care – PPO | Admitting: Cardiology

## 2023-06-14 ENCOUNTER — Ambulatory Visit: Payer: BC Managed Care – PPO | Admitting: Cardiology

## 2023-06-16 ENCOUNTER — Ambulatory Visit: Payer: BC Managed Care – PPO | Admitting: Cardiology

## 2023-06-30 ENCOUNTER — Other Ambulatory Visit: Payer: Self-pay

## 2023-06-30 ENCOUNTER — Emergency Department

## 2023-06-30 ENCOUNTER — Emergency Department
Admission: EM | Admit: 2023-06-30 | Discharge: 2023-06-30 | Disposition: A | Discharge status: Home / Self Care | Attending: Emergency Medicine | Admitting: Emergency Medicine

## 2023-06-30 DIAGNOSIS — E119 Type 2 diabetes mellitus without complications: Secondary | ICD-10-CM | POA: Diagnosis not present

## 2023-06-30 DIAGNOSIS — I071 Rheumatic tricuspid insufficiency: Secondary | ICD-10-CM | POA: Diagnosis not present

## 2023-06-30 DIAGNOSIS — I5022 Chronic systolic (congestive) heart failure: Secondary | ICD-10-CM | POA: Diagnosis not present

## 2023-06-30 DIAGNOSIS — R531 Weakness: Secondary | ICD-10-CM | POA: Diagnosis present

## 2023-06-30 DIAGNOSIS — I509 Heart failure, unspecified: Secondary | ICD-10-CM | POA: Insufficient documentation

## 2023-06-30 DIAGNOSIS — I4510 Unspecified right bundle-branch block: Secondary | ICD-10-CM | POA: Insufficient documentation

## 2023-06-30 DIAGNOSIS — N390 Urinary tract infection, site not specified: Secondary | ICD-10-CM | POA: Diagnosis not present

## 2023-06-30 DIAGNOSIS — N179 Acute kidney failure, unspecified: Secondary | ICD-10-CM

## 2023-06-30 LAB — URINALYSIS, ROUTINE W REFLEX MICROSCOPIC
Bilirubin Urine: NEGATIVE
Glucose, UA: NEGATIVE mg/dL
Hgb urine dipstick: NEGATIVE
Ketones, ur: NEGATIVE mg/dL
Nitrite: NEGATIVE
Protein, ur: 100 mg/dL — AB
Specific Gravity, Urine: 1.017 (ref 1.005–1.030)
pH: 5 (ref 5.0–8.0)

## 2023-06-30 LAB — T4, FREE: Free T4: 1.11 ng/dL (ref 0.61–1.12)

## 2023-06-30 LAB — CBC WITH DIFFERENTIAL/PLATELET
Abs Immature Granulocytes: 0.01 10*3/uL (ref 0.00–0.07)
Basophils Absolute: 0.1 10*3/uL (ref 0.0–0.1)
Basophils Relative: 1 %
Eosinophils Absolute: 0.1 10*3/uL (ref 0.0–0.5)
Eosinophils Relative: 2 %
HCT: 37.5 % (ref 36.0–46.0)
Hemoglobin: 12.4 g/dL (ref 12.0–15.0)
Immature Granulocytes: 0 %
Lymphocytes Relative: 17 %
Lymphs Abs: 1.1 10*3/uL (ref 0.7–4.0)
MCH: 31 pg (ref 26.0–34.0)
MCHC: 33.1 g/dL (ref 30.0–36.0)
MCV: 93.8 fL (ref 80.0–100.0)
Monocytes Absolute: 0.4 10*3/uL (ref 0.1–1.0)
Monocytes Relative: 6 %
Neutro Abs: 4.7 10*3/uL (ref 1.7–7.7)
Neutrophils Relative %: 74 %
Platelets: 258 10*3/uL (ref 150–400)
RBC: 4 MIL/uL (ref 3.87–5.11)
RDW: 16.8 % — ABNORMAL HIGH (ref 11.5–15.5)
WBC: 6.4 10*3/uL (ref 4.0–10.5)
nRBC: 0 % (ref 0.0–0.2)

## 2023-06-30 LAB — RESP PANEL BY RT-PCR (RSV, FLU A&B, COVID)  RVPGX2
Influenza A by PCR: NEGATIVE
Influenza B by PCR: NEGATIVE
Resp Syncytial Virus by PCR: NEGATIVE
SARS Coronavirus 2 by RT PCR: NEGATIVE

## 2023-06-30 LAB — COMPREHENSIVE METABOLIC PANEL
ALT: 17 U/L (ref 0–44)
AST: 30 U/L (ref 15–41)
Albumin: 3.9 g/dL (ref 3.5–5.0)
Alkaline Phosphatase: 78 U/L (ref 38–126)
Anion gap: 13 (ref 5–15)
BUN: 25 mg/dL — ABNORMAL HIGH (ref 8–23)
CO2: 22 mmol/L (ref 22–32)
Calcium: 9.2 mg/dL (ref 8.9–10.3)
Chloride: 104 mmol/L (ref 98–111)
Creatinine, Ser: 1.2 mg/dL — ABNORMAL HIGH (ref 0.44–1.00)
GFR, Estimated: 44 mL/min — ABNORMAL LOW (ref >60)
Glucose, Bld: 155 mg/dL — ABNORMAL HIGH (ref 70–99)
Potassium: 3.9 mmol/L (ref 3.5–5.1)
Sodium: 139 mmol/L (ref 135–145)
Total Bilirubin: 1.3 mg/dL — ABNORMAL HIGH (ref 0.0–1.2)
Total Protein: 7.4 g/dL (ref 6.5–8.1)

## 2023-06-30 LAB — DIGOXIN LEVEL: Digoxin Level: <0.2 ng/mL — ABNORMAL LOW (ref 0.8–2.0)

## 2023-06-30 LAB — BRAIN NATRIURETIC PEPTIDE: B Natriuretic Peptide: 992.1 pg/mL — ABNORMAL HIGH (ref 0.0–100.0)

## 2023-06-30 LAB — TROPONIN I (HIGH SENSITIVITY)
Troponin I (High Sensitivity): 240 ng/L (ref <18)
Troponin I (High Sensitivity): 237 ng/L (ref <18)

## 2023-06-30 LAB — TSH: TSH: 1.651 u[IU]/mL (ref 0.350–4.500)

## 2023-06-30 MED ORDER — FOSFOMYCIN TROMETHAMINE 3 G PO PACK
3.0000 g | PACK | Freq: Once | ORAL | Status: AC
  Administered 2023-06-30: 3 g via ORAL
  Filled 2023-06-30: qty 3

## 2023-06-30 MED ORDER — SODIUM CHLORIDE 0.9 % IV BOLUS: 500.0000 mL | Freq: Once | INTRAVENOUS | Status: DC

## 2023-06-30 NOTE — Discharge Instructions (Signed)
 We have treated you for a UTI your urine culture will come back in 3 days.  Please follow-up with your primary care doctor on Monday or Tuesday. Your kidney function was just slightly elevated and you could be a little bit over diuresed so we would recommend you hold 1 dose of your Lasix tomorrow.  Try to restart it Sunday unless you feel lightheaded and weak you can skip one more dose.  Resume your Lasix on Monday and try to get a follow-up appointment on Monday or Tuesday.  Is going to be important to carefully watch her fluid balance.  Return to the ER for worsening symptoms or any other concerns

## 2023-06-30 NOTE — ED Provider Notes (Signed)
 [facility] Provider Note    Event Date/Time   First MD Initiated Contact with Patient 06/30/23 [phone]     (approximate)   History   Weakness   HPI  [name] [name] is a 88 y.o. female with diabetes, hyperlipidemia, CHF with EF of 20 to 25%, severe TR who comes in with weakness.  I reviewed patient's hospital admission from 03/30/2023 they had recommended a cardiac catheterization but patient declined.  Patient had a pleural effusion on the right that was too small for IR to tap.    Per patient she has had some off-and-on weakness since this hospital admission but today she felt more weak than normal therefore presented to the emergency room.  She states that she just does not feel well denies really any chest pain or shortness of breath.  When I asked her if she was in any pain she stated "not yet "patient was talking about how her last hospital stay cost her $50,000 that her insurance had to pay off.  Patient was able to get up off the stretcher and stand and get into the bed.  She denies any falls.  Vitals with EMS were normal.  Patient reports eating and drinking well.  Physical Exam   Triage Vital Signs: ED Triage Vitals  Encounter Vitals Group     BP      Systolic BP Percentile      Diastolic BP Percentile      Pulse      Resp      Temp      Temp src      SpO2      Weight      Height      Head Circumference      Peak Flow      Pain Score      Pain Loc      Pain Education      Exclude from Growth Chart     Most recent vital signs: Vitals:   06/30/23 0857  BP: (!) 144/89  Pulse: 97  Resp: 18  Temp: 98.3 F (36.8 C)  SpO2: 100%     General: Awake, no distress.  CV:  Good peripheral perfusion.  Resp:  Normal effort.  Abd:  No distention.  Soft and nontender Other:  No swelling her legs.  Equal strength in arms and legs.  Finger-to-nose intact.  She denies any dizziness.  Does report global weakness.  Good distal pulses.   ED Results  / Procedures / Treatments   Labs (all labs ordered are listed, but only abnormal results are displayed) Labs Reviewed  RESP PANEL BY RT-PCR (RSV, FLU A&B, COVID)  RVPGX2  CBC WITH DIFFERENTIAL/PLATELET  COMPREHENSIVE METABOLIC PANEL  URINALYSIS, ROUTINE W REFLEX MICROSCOPIC  BRAIN NATRIURETIC PEPTIDE  DIGOXIN LEVEL  TSH  T4, FREE  TROPONIN I (HIGH SENSITIVITY)     EKG  My interpretation of EKG:  Sinus rate 97 without any ST elevation, T wave version aVL, V2, normal intervals, incomplete right bundle branch block  RADIOLOGY I have reviewed the xray personally and interpreted and no evidence of any effusions   PROCEDURES:  Critical Care performed: No  .1-3 Lead EKG Interpretation  Performed by: Concha Se, MD Authorized by: Concha Se, MD     Interpretation: normal     ECG rate:  90   ECG rate assessment: normal     Rhythm: sinus rhythm     Ectopy: none  Conduction: normal      MEDICATIONS ORDERED IN ED: Medications  fosfomycin (MONUROL) packet 3 g (has no administration in time range)     IMPRESSION / MDM / ASSESSMENT AND PLAN / ED COURSE  I reviewed the triage vital signs and the nursing notes.   Patient's presentation is most consistent with acute presentation with potential threat to life or bodily function.   Patient comes in with global weakness.  Nonfocal neurological examination no falls.  I do not feel he this represents a stroke or intracranial pathology.  No indication for CT imaging.  Will get basic labs.  Given her history of cardiac issues will add on BNP, troponin, chest x-ray to evaluate for any effusions.  It does appear that patient was previously on digoxin and the last cardiology note did recommend to stop taking it however it was listed still under the assessment and plan in the discharge summary although I do not really see it in the medication list however we will check a level just to ensure that this is 0.  CBC reassuring.   Thyroid testing normal.  CMP shows slight elevation of creatinine and BUN at 1.2 and BUN of 25.  Troponin is elevated at 240 which is similar to 3 months ago.  COVID, flu are negative.  BNP is downtrending  Repeat trop downtrending.  D/w patient and family given these are similar to when she was here in Dec- and pt has no chest pain- pt does not want cath at this time I do not believe that even indicated given her symptoms are very nonspecific and the fact that these numbers are similar and downtrending from prior.  We discussed admission to the hospital for cardiac monitoring but patient declined.  I do suspect patient is may be slightly over diuresed.  Her orthostatics were negative so I did not give any IV fluid but we did discuss how her creatinine and BUN were slightly elevated.  We discussed skipping 1 dose of Lasix tomorrow and reevaluate how she feels.  At most skipping 2 doses.  I recommend her follow-up with the heart failure clinic but patient declined.  She would rather follow-up with her PCP.  Patient has been continuing to ask for ice cream while here in the emergency room.  At this time she declines admission and feels comfortable with discharge home and feels much improved.  Her urine was concerning for possible UTI urine culture was sent.  I will give 1 dose of fosfomycin for treatment    The patient is on the cardiac monitor to evaluate for evidence of arrhythmia and/or significant heart rate changes.      FINAL CLINICAL IMPRESSION(S) / ED DIAGNOSES   Final diagnoses:  Urinary tract infection without hematuria, site unspecified  AKI (acute kidney injury) (HCC)     Rx / DC Orders   ED Discharge Orders     None        Note:  This document was prepared using Dragon voice recognition software and may include unintentional dictation errors.   Concha Se, MD 06/30/23 [phone]

## 2023-06-30 NOTE — ED Triage Notes (Signed)
 Complains of generalized weakness on and off since December but worse now.  Reports difficulty walking.  Vitals are stable and was able to stand from ems stretcher and get on ER stretcher.

## 2023-07-02 LAB — URINE CULTURE: Culture: 40000 — AB

## 2023-07-06 DIAGNOSIS — I272 Pulmonary hypertension, unspecified: Secondary | ICD-10-CM | POA: Diagnosis present

## 2023-07-09 ENCOUNTER — Other Ambulatory Visit: Payer: Self-pay

## 2023-07-09 ENCOUNTER — Emergency Department

## 2023-07-09 ENCOUNTER — Emergency Department
Admission: EM | Admit: 2023-07-09 | Discharge: 2023-07-09 | Disposition: A | Discharge status: Home / Self Care | Attending: Emergency Medicine | Admitting: Emergency Medicine

## 2023-07-09 DIAGNOSIS — M542 Cervicalgia: Secondary | ICD-10-CM | POA: Insufficient documentation

## 2023-07-09 DIAGNOSIS — R0602 Shortness of breath: Secondary | ICD-10-CM | POA: Insufficient documentation

## 2023-07-09 DIAGNOSIS — E119 Type 2 diabetes mellitus without complications: Secondary | ICD-10-CM | POA: Insufficient documentation

## 2023-07-09 DIAGNOSIS — I10 Essential (primary) hypertension: Secondary | ICD-10-CM | POA: Insufficient documentation

## 2023-07-09 LAB — RESP PANEL BY RT-PCR (RSV, FLU A&B, COVID)  RVPGX2
Influenza A by PCR: NEGATIVE
Influenza B by PCR: NEGATIVE
Resp Syncytial Virus by PCR: NEGATIVE
SARS Coronavirus 2 by RT PCR: NEGATIVE

## 2023-07-09 LAB — CBC
HCT: 38 % (ref 36.0–46.0)
Hemoglobin: 12.6 g/dL (ref 12.0–15.0)
MCH: 30.8 pg (ref 26.0–34.0)
MCHC: 33.2 g/dL (ref 30.0–36.0)
MCV: 92.9 fL (ref 80.0–100.0)
Platelets: 281 10*3/uL (ref 150–400)
RBC: 4.09 MIL/uL (ref 3.87–5.11)
RDW: 16.5 % — ABNORMAL HIGH (ref 11.5–15.5)
WBC: 6.6 10*3/uL (ref 4.0–10.5)
nRBC: 0 % (ref 0.0–0.2)

## 2023-07-09 LAB — BRAIN NATRIURETIC PEPTIDE: B Natriuretic Peptide: 1349.6 pg/mL — ABNORMAL HIGH (ref 0.0–100.0)

## 2023-07-09 LAB — BASIC METABOLIC PANEL WITH GFR
Anion gap: 10 (ref 5–15)
BUN: 19 mg/dL (ref 8–23)
CO2: 20 mmol/L — ABNORMAL LOW (ref 22–32)
Calcium: 9 mg/dL (ref 8.9–10.3)
Chloride: 106 mmol/L (ref 98–111)
Creatinine, Ser: 1.09 mg/dL — ABNORMAL HIGH (ref 0.44–1.00)
GFR, Estimated: 49 mL/min — ABNORMAL LOW (ref >60)
Glucose, Bld: 131 mg/dL — ABNORMAL HIGH (ref 70–99)
Potassium: 4.4 mmol/L (ref 3.5–5.1)
Sodium: 136 mmol/L (ref 135–145)

## 2023-07-09 LAB — TROPONIN I (HIGH SENSITIVITY)
Troponin I (High Sensitivity): 225 ng/L (ref <18)
Troponin I (High Sensitivity): 245 ng/L (ref <18)

## 2023-07-09 NOTE — ED Triage Notes (Signed)
 Pt sts that she has been SOB with neck pain since this morning. Pt also has many other complaints.

## 2023-07-09 NOTE — Discharge Instructions (Signed)
 Please have her restart taking her fluid pill 2-3 times a week.  You can alternate days taking this.  I would like you to follow-up with your cardiology and internal medicine team for ongoing outpatient management.  You should discuss with them what kinds of treatments she would like going forward in the future and how to provide [place] provide her care that aligns with her goals as remaining comfortable and avoiding hospitalizations.  Please return for any other severe worsening symptoms.

## 2023-07-09 NOTE — ED Notes (Signed)
 First Nurse Note: Pt to ED via [facility] from home for breathing difficulties. Per EMS pt sinus tach on the monitor at 106, SpO2 99% on RA. CBG 147, BP 141/86.

## 2023-07-09 NOTE — ED Notes (Signed)
 Troponin 225 called by lab informed Dr Anner Crete.

## 2023-07-09 NOTE — ED Provider Notes (Signed)
 [facility] Provider Note    Event Date/Time   First MD Initiated Contact with Patient 07/09/23 1531     (approximate)   History   Shortness of Breath and Neck Pain   HPI [name] [name] is a 88 y.o. female with history of HFrEF with EF 25%, diabetes, HLD, HTN, dementia presenting today for shortness of breath.  Patient notes over the past couple of days she has had intermittent congestion, nonproductive cough, and shortness of breath.  Mild body aches as well.  She otherwise denies any fevers, chest pain, abdominal pain, nausea, vomiting, diarrhea, constipation, dysuria.  Daughter at bedside concerned she may have had a viral illness or possibly volume overload with her history of CHF.    She was seen in the ED 1 week ago with largely reassuring workup but possible concerns that she may have been dehydrated from her Lasix use.  They recommended her stop for a couple days before restarting.  She then saw her internal medicine provider several days ago who recommended she take her fluid pill a couple times a week.  She has not taken the fluid pill once since leaving the ED 8 days ago.     Physical Exam   Triage Vital Signs: ED Triage Vitals  Encounter Vitals Group     BP 07/09/23 1412 (!) 133/97     Systolic BP Percentile --      Diastolic BP Percentile --      Pulse Rate 07/09/23 1412 100     Resp 07/09/23 1412 19     Temp 07/09/23 1412 98.1 F (36.7 C)     Temp Source 07/09/23 1412 Oral     SpO2 07/09/23 1412 99 %     Weight 07/09/23 1413 160 lb (72.6 kg)     Height 07/09/23 1413 5\' 5"  (1.651 m)     Head Circumference --      Peak Flow --      Pain Score 07/09/23 1412 10     Pain Loc --      Pain Education --      Exclude from Growth Chart --     Most recent vital signs: Vitals:   07/09/23 1412 07/09/23 1725  BP: (!) 133/97 (!) 140/76  Pulse: 100 100  Resp: 19 18  Temp: 98.1 F (36.7 C) 98 F (36.7 C)  SpO2: 99% 100%   Physical Exam: I  have reviewed the vital signs and nursing notes. General: Awake, alert, no acute distress.  Nontoxic appearing. Head:  Atraumatic, normocephalic.   ENT:  EOM intact, PERRL. Oral mucosa is pink and moist with no lesions. Neck: Neck is supple with full range of motion, No meningeal signs. Cardiovascular:  RRR, No murmurs. Peripheral pulses palpable and equal bilaterally. Respiratory:  Symmetrical chest wall expansion.  No rhonchi, rales, or wheezes.  Good air movement throughout.  No use of accessory muscles.   Musculoskeletal:  No cyanosis.  Trace pretibial edema bilaterally.  Moving extremities with full ROM Abdomen:  Soft, nontender, nondistended. Neuro:  GCS 15, moving all four extremities, interacting appropriately. Speech clear. Psych:  Calm, appropriate.   Skin:  Warm, dry, no rash.    ED Results / Procedures / Treatments   Labs (all labs ordered are listed, but only abnormal results are displayed) Labs Reviewed  BASIC METABOLIC PANEL WITH GFR - Abnormal; Notable for the following components:      Result Value   CO2 20 (*)  Glucose, Bld 131 (*)    Creatinine, Ser 1.09 (*)    GFR, Estimated 49 (*)    All other components within normal limits  CBC - Abnormal; Notable for the following components:   RDW 16.5 (*)    All other components within normal limits  BRAIN NATRIURETIC PEPTIDE - Abnormal; Notable for the following components:   B Natriuretic Peptide 1,349.6 (*)    All other components within normal limits  TROPONIN I (HIGH SENSITIVITY) - Abnormal; Notable for the following components:   Troponin I (High Sensitivity) 225 (*)    All other components within normal limits  TROPONIN I (HIGH SENSITIVITY) - Abnormal; Notable for the following components:   Troponin I (High Sensitivity) 245 (*)    All other components within normal limits  RESP PANEL BY RT-PCR (RSV, FLU A&B, COVID)  [id number]     EKG My EKG interpretation: Rate of 100, normal sinus rhythm, left axis  deviation with incomplete right bundle branch block.  No acute ST elevations or depressions.  Largely comparable with most recent EKG   RADIOLOGY Independently interpreted chest x-ray with no acute signs of volume overload or pneumonia   PROCEDURES:  Critical Care performed: No  Procedures   MEDICATIONS ORDERED IN ED: Medications - No data to display   IMPRESSION / MDM / ASSESSMENT AND PLAN / ED COURSE  I reviewed the triage vital signs and the nursing notes.                              Differential diagnosis includes, but is not limited to, volume overload from CHF, viral URI, pneumonia, lower suspicion ACS  Patient's presentation is most consistent with exacerbation of chronic illness.  Patient is an 88 year old female presenting today for shortness of breath, congestion, nonproductive cough.  She is in no acute distress at the bedside with no tachypnea.  Lung sounds are clear.  Vital signs are stable with no hypoxia and satting 100% on room air and no tachypnea.  EKG consistent with her baseline.  CBC with no leukocytosis.  BMP shows improvement in creatinine function from most recent values.  Initial troponin elevated at 225 although it is consistent with her most recent baseline.  BNP slightly elevated from 1 week ago at 1300.  Repeat troponin relatively stable with her baseline at 245.  She has not had any chest pain here or today.  Negative for COVID, flu, RSV.  She has chronically elevated troponins for which I have discussed this with daughter and patient at bedside.  They still remain that she would not want a heart cath.  She has no tachypnea with walking and no hypoxia.  I do not see any emergent indication to admit her for possible slight volume overload with the elevated BNP.  Daughter at bedside agrees with this.  I recommend they follow the internal medicine instructions of using her Lasix 2-3 times per week to help keep additional volume off.  She is agreeable with this  plan.  She will follow-up with the internal medicine team and her cardiology team in the near future for ongoing evaluation.  Given strict return precautions.  Separately, we also discussed having a conversation with the internal medicine team regarding goals of care.  Daughter feels it is largely based on managing patient's comfort and trying to restrict the amount of procedures or hospital admissions that are required.  Patient seems agreeable with this as well  and wants to avoid the hospital is much as possible.  I recommended they discuss this with their primary care provider and fill out any additional forms that may be necessary to accurately guide her care in the future.  The patient is on the cardiac monitor to evaluate for evidence of arrhythmia and/or significant heart rate changes. Clinical Course as of 07/09/23 1740  Sun Jul 09, 2023  1637 Patient now asking to go home.  I asked if they would stay for the second troponin just to make sure remained stable at her baseline.  They were agreeable with this but then would like to follow-up the COVID test outpatient.  She is denying any shortness of breath and wants to go home. [DW]    Clinical Course User Index [DW] Janith Lima, MD     FINAL CLINICAL IMPRESSION(S) / ED DIAGNOSES   Final diagnoses:  Shortness of breath     Rx / DC Orders   ED Discharge Orders     None        Note:  This document was prepared using Dragon voice recognition software and may include unintentional dictation errors.   Janith Lima, MD 07/09/23 [phone]

## 2023-07-14 ENCOUNTER — Emergency Department

## 2023-07-14 ENCOUNTER — Other Ambulatory Visit: Payer: Self-pay

## 2023-07-14 ENCOUNTER — Inpatient Hospital Stay
Admission: EM | Admit: 2023-07-14 | Discharge: 2023-07-19 | DRG: 291 | Disposition: A | Discharge status: Skilled Nursing Facility | Attending: Internal Medicine | Admitting: Internal Medicine

## 2023-07-14 DIAGNOSIS — E1122 Type 2 diabetes mellitus with diabetic chronic kidney disease: Secondary | ICD-10-CM | POA: Diagnosis present

## 2023-07-14 DIAGNOSIS — I509 Heart failure, unspecified: Principal | ICD-10-CM

## 2023-07-14 DIAGNOSIS — R0602 Shortness of breath: Secondary | ICD-10-CM | POA: Diagnosis present

## 2023-07-14 DIAGNOSIS — E872 Acidosis, unspecified: Secondary | ICD-10-CM | POA: Diagnosis present

## 2023-07-14 DIAGNOSIS — Z66 Do not resuscitate: Secondary | ICD-10-CM | POA: Diagnosis present

## 2023-07-14 DIAGNOSIS — Z8673 Personal history of transient ischemic attack (TIA), and cerebral infarction without residual deficits: Secondary | ICD-10-CM

## 2023-07-14 DIAGNOSIS — Z79899 Other long term (current) drug therapy: Secondary | ICD-10-CM | POA: Diagnosis not present

## 2023-07-14 DIAGNOSIS — Z853 Personal history of malignant neoplasm of breast: Secondary | ICD-10-CM

## 2023-07-14 DIAGNOSIS — Z87891 Personal history of nicotine dependence: Secondary | ICD-10-CM | POA: Diagnosis not present

## 2023-07-14 DIAGNOSIS — I5043 Acute on chronic combined systolic (congestive) and diastolic (congestive) heart failure: Secondary | ICD-10-CM | POA: Diagnosis present

## 2023-07-14 DIAGNOSIS — Z7984 Long term (current) use of oral hypoglycemic drugs: Secondary | ICD-10-CM | POA: Diagnosis not present

## 2023-07-14 DIAGNOSIS — J9811 Atelectasis: Secondary | ICD-10-CM | POA: Diagnosis present

## 2023-07-14 DIAGNOSIS — I472 Ventricular tachycardia, unspecified: Secondary | ICD-10-CM | POA: Diagnosis not present

## 2023-07-14 DIAGNOSIS — F0390 Unspecified dementia without behavioral disturbance: Secondary | ICD-10-CM | POA: Diagnosis present

## 2023-07-14 DIAGNOSIS — Z7982 Long term (current) use of aspirin: Secondary | ICD-10-CM | POA: Diagnosis not present

## 2023-07-14 DIAGNOSIS — I5022 Chronic systolic (congestive) heart failure: Secondary | ICD-10-CM | POA: Diagnosis present

## 2023-07-14 DIAGNOSIS — I4729 Other ventricular tachycardia: Secondary | ICD-10-CM | POA: Insufficient documentation

## 2023-07-14 DIAGNOSIS — N1831 Chronic kidney disease, stage 3a: Secondary | ICD-10-CM | POA: Diagnosis present

## 2023-07-14 DIAGNOSIS — I5023 Acute on chronic systolic (congestive) heart failure: Secondary | ICD-10-CM | POA: Diagnosis not present

## 2023-07-14 DIAGNOSIS — I130 Hypertensive heart and chronic kidney disease with heart failure and stage 1 through stage 4 chronic kidney disease, or unspecified chronic kidney disease: Principal | ICD-10-CM | POA: Diagnosis present

## 2023-07-14 DIAGNOSIS — I10 Essential (primary) hypertension: Secondary | ICD-10-CM | POA: Diagnosis not present

## 2023-07-14 DIAGNOSIS — E876 Hypokalemia: Secondary | ICD-10-CM | POA: Diagnosis present

## 2023-07-14 DIAGNOSIS — E785 Hyperlipidemia, unspecified: Secondary | ICD-10-CM | POA: Diagnosis present

## 2023-07-14 DIAGNOSIS — R531 Weakness: Secondary | ICD-10-CM

## 2023-07-14 LAB — URINALYSIS, ROUTINE W REFLEX MICROSCOPIC
Bacteria, UA: NONE SEEN
Bilirubin Urine: NEGATIVE
Glucose, UA: NEGATIVE mg/dL
Hgb urine dipstick: NEGATIVE
Ketones, ur: NEGATIVE mg/dL
Nitrite: NEGATIVE
Protein, ur: >=300 mg/dL — AB
Specific Gravity, Urine: 1.03 (ref 1.005–1.030)
pH: 5 (ref 5.0–8.0)

## 2023-07-14 LAB — BASIC METABOLIC PANEL WITH GFR
Anion gap: 11 (ref 5–15)
BUN: 18 mg/dL (ref 8–23)
CO2: 20 mmol/L — ABNORMAL LOW (ref 22–32)
Calcium: 9.3 mg/dL (ref 8.9–10.3)
Chloride: 104 mmol/L (ref 98–111)
Creatinine, Ser: 1.11 mg/dL — ABNORMAL HIGH (ref 0.44–1.00)
GFR, Estimated: 48 mL/min — ABNORMAL LOW (ref >60)
Glucose, Bld: 138 mg/dL — ABNORMAL HIGH (ref 70–99)
Potassium: 4.3 mmol/L (ref 3.5–5.1)
Sodium: 135 mmol/L (ref 135–145)

## 2023-07-14 LAB — CBC
HCT: 39 % (ref 36.0–46.0)
Hemoglobin: 13.1 g/dL (ref 12.0–15.0)
MCH: 31 pg (ref 26.0–34.0)
MCHC: 33.6 g/dL (ref 30.0–36.0)
MCV: 92.2 fL (ref 80.0–100.0)
Platelets: 284 10*3/uL (ref 150–400)
RBC: 4.23 MIL/uL (ref 3.87–5.11)
RDW: 16.3 % — ABNORMAL HIGH (ref 11.5–15.5)
WBC: 5.8 10*3/uL (ref 4.0–10.5)
nRBC: 0 % (ref 0.0–0.2)

## 2023-07-14 LAB — TROPONIN I (HIGH SENSITIVITY)
Troponin I (High Sensitivity): 239 ng/L (ref <18)
Troponin I (High Sensitivity): 250 ng/L (ref <18)

## 2023-07-14 LAB — GLUCOSE, CAPILLARY: Glucose-Capillary: 127 mg/dL — ABNORMAL HIGH (ref 70–99)

## 2023-07-14 LAB — BRAIN NATRIURETIC PEPTIDE: B Natriuretic Peptide: 1371.2 pg/mL — ABNORMAL HIGH (ref 0.0–100.0)

## 2023-07-14 MED ORDER — ALLOPURINOL 100 MG PO TABS
100.0000 mg | ORAL_TABLET | Freq: Every day | ORAL | Status: DC
  Administered 2023-07-15 – 2023-07-19 (×5): 100 mg via ORAL
  Filled 2023-07-14 (×5): qty 1

## 2023-07-14 MED ORDER — SPIRONOLACTONE 12.5 MG HALF TABLET
12.5000 mg | ORAL_TABLET | Freq: Every day | ORAL | Status: DC
  Administered 2023-07-15 – 2023-07-17 (×3): 12.5 mg via ORAL
  Filled 2023-07-14 (×3): qty 1

## 2023-07-14 MED ORDER — ACETAMINOPHEN 500 MG PO TABS
1000.0000 mg | ORAL_TABLET | Freq: Once | ORAL | Status: AC
  Administered 2023-07-14: 1000 mg via ORAL
  Filled 2023-07-14: qty 2

## 2023-07-14 MED ORDER — SODIUM CHLORIDE 0.9% FLUSH
3.0000 mL | Freq: Two times a day (BID) | INTRAVENOUS | Status: DC
  Administered 2023-07-14 – 2023-07-19 (×10): 3 mL via INTRAVENOUS

## 2023-07-14 MED ORDER — SODIUM CHLORIDE 0.9 % IV SOLN: 250.0000 mL | INTRAVENOUS | Status: AC | PRN

## 2023-07-14 MED ORDER — FUROSEMIDE 10 MG/ML IJ SOLN: 40.0000 mg | Freq: Once | INTRAMUSCULAR | Status: DC

## 2023-07-14 MED ORDER — ENOXAPARIN SODIUM 40 MG/0.4ML IJ SOSY
40.0000 mg | PREFILLED_SYRINGE | INTRAMUSCULAR | Status: DC
Start: 2023-07-14 — End: 2023-07-17
  Administered 2023-07-14 – 2023-07-16 (×3): 40 mg via SUBCUTANEOUS
  Filled 2023-07-14 (×3): qty 0.4

## 2023-07-14 MED ORDER — MEMANTINE HCL 10 MG PO TABS
20.0000 mg | ORAL_TABLET | Freq: Every evening | ORAL | Status: DC
  Administered 2023-07-15 – 2023-07-18 (×4): 20 mg via ORAL
  Filled 2023-07-14 (×4): qty 2
  Filled 2023-07-14: qty 4
  Filled 2023-07-14: qty 2

## 2023-07-14 MED ORDER — ONDANSETRON HCL 4 MG/2ML IJ SOLN: 4.0000 mg | Freq: Four times a day (QID) | INTRAMUSCULAR | Status: DC | PRN

## 2023-07-14 MED ORDER — SODIUM CHLORIDE 0.9% FLUSH: 3.0000 mL | INTRAVENOUS | Status: DC | PRN

## 2023-07-14 MED ORDER — PRAVASTATIN SODIUM 20 MG PO TABS
20.0000 mg | ORAL_TABLET | Freq: Every day | ORAL | Status: DC
  Administered 2023-07-14 – 2023-07-18 (×5): 20 mg via ORAL
  Filled 2023-07-14 (×5): qty 1

## 2023-07-14 MED ORDER — LOSARTAN POTASSIUM 25 MG PO TABS
12.5000 mg | ORAL_TABLET | Freq: Every day | ORAL | Status: DC
  Administered 2023-07-15 – 2023-07-17 (×3): 12.5 mg via ORAL
  Filled 2023-07-14 (×3): qty 1

## 2023-07-14 MED ORDER — ACETAMINOPHEN 325 MG PO TABS
650.0000 mg | ORAL_TABLET | ORAL | Status: DC | PRN
  Administered 2023-07-16 – 2023-07-19 (×2): 650 mg via ORAL
  Filled 2023-07-14 (×2): qty 2

## 2023-07-14 MED ORDER — ASPIRIN 81 MG PO TBEC
81.0000 mg | DELAYED_RELEASE_TABLET | Freq: Every day | ORAL | Status: DC
  Administered 2023-07-15 – 2023-07-19 (×5): 81 mg via ORAL
  Filled 2023-07-14 (×5): qty 1

## 2023-07-14 MED ORDER — IOHEXOL 350 MG/ML SOLN
75.0000 mL | Freq: Once | INTRAVENOUS | Status: AC | PRN
  Administered 2023-07-14: 75 mL via INTRAVENOUS

## 2023-07-14 MED ORDER — INSULIN ASPART 100 UNIT/ML IJ SOLN
0.0000 [IU] | Freq: Three times a day (TID) | INTRAMUSCULAR | Status: DC
  Administered 2023-07-15 – 2023-07-19 (×9): 1 [IU] via SUBCUTANEOUS
  Filled 2023-07-14 (×9): qty 1

## 2023-07-14 MED ORDER — FUROSEMIDE 10 MG/ML IJ SOLN
40.0000 mg | Freq: Two times a day (BID) | INTRAMUSCULAR | Status: DC
  Administered 2023-07-14 – 2023-07-16 (×4): 40 mg via INTRAVENOUS
  Filled 2023-07-14 (×4): qty 4

## 2023-07-14 NOTE — ED Notes (Signed)
 Patient ambulated to restroom with walker with no issues. Patient reported she felt "weak."

## 2023-07-14 NOTE — H&P (Addendum)
 History and Physical    Patient: [name] [name] ZOX:[id number] DOB: March 28, 1936 DOA: 07/14/2023 DOS: the patient was seen and examined on 07/14/2023 PCP: Lynnea Ferrier, MD  Patient coming from: Home  Chief Complaint:  Chief Complaint  Patient presents with   Weakness   HPI: [name] [name] is a 88 y.o. female with medical history significant of essential hypertension, history of stroke, type 2 diabetes, chronic systolic congestive heart failure with ejection fraction 25%, dementia, who presents to the hospital with generalized weakness and shortness of breath. Patient was recently in the ED with dizziness, at that time, Lasix was held for 2 days, but the patient did not restart it.  For the last few days, patient has increased short of breath with exertion, occasional paroxysmal nocturnal dyspnea.  She also has ankle edema.  She did not have any weight gain.  She continued to have dizziness with walking.  She also had increased weakness. She also complaining of chest pain, she points to her mid upper chest, she could not elaborate due to dementia.  Upon arriving to the hospital, patient had a CT scan, rule out PE, showed a lateral pleural effusion, on the right side.  Patient also had significant elevation in BNP, she diagnosed with acute on chronic congestive heart failure.  Started on IV Lasix. Review of Systems: As mentioned in the history of present illness. All other systems reviewed and are negative. Past Medical History:  Diagnosis Date   Cancer [facility])    right breast cancer   Diabetes mellitus without complication (HCC)    Hypertension    Stroke [facility])    Past Surgical History:  Procedure Laterality Date   BREAST SURGERY     right lumpectomy   KNEE SURGERY     bilateral   Social History:  reports that she quit smoking about 35 years ago. Her smoking use included cigarettes. She has never used smokeless tobacco. She reports that she does not drink alcohol and does not use  drugs.  Allergies  Allergen Reactions   Codeine Other (See Comments)    "I go crazy"   Etodolac     Other reaction(s): Unknown   Indomethacin     Other reaction(s): Unknown   Penicillins Other (See Comments)    Does not remember this allergy Has patient had a PCN reaction causing immediate rash, facial/tongue/throat swelling, SOB or lightheadedness with hypotension: Unknown Has patient had a PCN reaction causing severe rash involving mucus membranes or skin necrosis: Unknown Has patient had a PCN reaction that required hospitalization: Unknown Has patient had a PCN reaction occurring within the last 10 years: Unknown If all of the above answers are "NO", then may proceed with Cephalosporin use.    Tizanidine     Other reaction(s): Syncope Fell on the floor    Tramadol     Other reaction(s): Unknown    No family history on file.  Prior to Admission medications   Medication Sig Start Date End Date Taking? Authorizing Provider  allopurinol (ZYLOPRIM) 100 MG tablet Take 1 tablet by mouth daily. 03/05/23   [provider]  aspirin EC 81 MG tablet Take 1 tablet (81 mg total) by mouth daily. Swallow whole. 04/04/23   Sunnie Nielsen, DO  colchicine 0.6 MG tablet Take 0.6 mg by mouth daily. 09/07/22   [provider]  furosemide (LASIX) 20 MG tablet Take 1 tablet (20 mg total) by mouth daily. 05/05/23 06/04/23  Sabharwal, Eliezer Lofts, DO  loperamide (LOPERAMIDE A-D) 2  MG tablet Take 2 mg by mouth as needed for diarrhea or loose stools.    [provider]  losartan (COZAAR) 25 MG tablet Take 0.5 tablets (12.5 mg total) by mouth daily. 04/04/23   Sunnie Nielsen, DO  memantine (NAMENDA) 10 MG tablet Take 2 tablets by mouth every evening.  10/26/15   [provider]  metFORMIN (GLUCOPHAGE) 1000 MG tablet Take 1 tablet by mouth 2 (two) times daily. Take 1 tablet in the morning and 1 tablet in the evening 01/11/16   [provider]  naproxen sodium  (ALEVE) 220 MG tablet Take 220 mg by mouth daily as needed.    [provider]  polyethylene glycol (MIRALAX / GLYCOLAX) packet Take 17 g by mouth daily as needed (Constipation). 02/04/18   Salary, Jetty Duhamel D, MD  pravastatin (PRAVACHOL) 20 MG tablet Take 1 tablet by mouth every evening.  01/02/16   [provider]  sodium chloride (OCEAN) 0.65 % SOLN nasal spray Place 1 spray into both nostrils as needed for congestion.    [provider]  spironolactone (ALDACTONE) 25 MG tablet Take 0.5 tablets (12.5 mg total) by mouth daily. 04/04/23   Sunnie Nielsen, DO    Physical Exam: Vitals:   07/14/23 1530 07/14/23 1600 07/14/23 1630 07/14/23 1700  BP: (!) 142/106 (!) 148/91 (!) 131/106 135/86  Pulse: 100 (!) 102 (!) 103 94  Resp: (!) 21 16 19 19   Temp:    98.8 F (37.1 C)  TempSrc:    Oral  SpO2: 100% 100% 100% 100%  Weight:       Physical Exam Constitutional:      General: She is not in acute distress.    Appearance: She is not ill-appearing or toxic-appearing.  HENT:     Head: Normocephalic and atraumatic.     Nose: Nose normal. No congestion.     Mouth/Throat:     Mouth: Mucous membranes are moist.     Pharynx: Oropharynx is clear.  Eyes:     Extraocular Movements: Extraocular movements intact.     Conjunctiva/sclera: Conjunctivae normal.     Pupils: Pupils are equal, round, and reactive to light.  Cardiovascular:     Rate and Rhythm: Normal rate and regular rhythm.     Heart sounds: No murmur heard.    No gallop.  Pulmonary:     Effort: Pulmonary effort is normal.     Breath sounds: Rales present.  Abdominal:     General: Abdomen is flat. Bowel sounds are normal. There is no distension.     Palpations: Abdomen is soft.     Tenderness: There is no abdominal tenderness.  Musculoskeletal:        General: Normal range of motion.     Cervical back: Normal range of motion and neck supple. No rigidity or tenderness.     Right lower leg: Edema present.      Left lower leg: Edema present.  Lymphadenopathy:     Cervical: No cervical adenopathy.  Skin:    General: Skin is warm and dry.     Coloration: Skin is not jaundiced.  Neurological:     General: No focal deficit present.     Mental Status: She is alert and oriented to person, place, and time.     Cranial Nerves: No cranial nerve deficit.  Psychiatric:        Mood and Affect: Mood normal.        Thought Content: Thought content normal.  Data Reviewed:  Reviewed CT scan results, lab results.  Assessment and Plan: Acute on chronic combined systolic diastolic congestive heart failure. Lateral pleural effusion secondary to congestive heart failure. Reviewed echocardiogram performed in December 2024, ejection fraction was 20 to 25% with grade 3 diastolic dysfunction. Patient had a second short of breath, chest CT scan had a pleural effusion, evaded BNP. Patient was started on IV Lasix, will continue. Also on ARB, may need to start beta-Spielmann.  Chronic kidney disease stage IIIa Mild metabolic acidosis. Follow renal function with increased diuretics.  Essential hypertension. Continue ARB.  Type 2 diabetes. Start sliding scale insulin  General Weakness. Will obtain PT/OT.  Patient may need nursing placement.  Dementia. Follow closely, high risk for delirium.     Advance Care Planning:   Code Status: Limited: Do not attempt resuscitation (DNR) -DNR-LIMITED -Do Not Intubate/DNI discussed with patient daughter and the patient, patient is DO NOT RESUSCITATE status.  Consults: None  Family Communication: Daughter updated at bedside.  Severity of Illness: The appropriate patient status for this patient is INPATIENT. Inpatient status is judged to be reasonable and necessary in order to provide the required intensity of service to ensure the patient's safety. The patient's presenting symptoms, physical exam findings, and initial radiographic and laboratory data in the  context of their chronic comorbidities is felt to place them at high risk for further clinical deterioration. Furthermore, it is not anticipated that the patient will be medically stable for discharge from the hospital within 2 midnights of admission.   * I certify that at the point of admission it is my clinical judgment that the patient will require inpatient hospital care spanning beyond 2 midnights from the point of admission due to high intensity of service, high risk for further deterioration and high frequency of surveillance required.*  Author: Marrion Coy, MD 07/14/2023 5:37 PM  For on call review [email or web address].

## 2023-07-14 NOTE — ED Triage Notes (Signed)
 C/o weakness, dizziness. States symptoms have been ongoing for quite some time, symptoms returned last night.  States it is hard to get up from the bed to walk with walker to bathroom.   Patient lives at [address]. Lives alone. Patient states she needs to be in the assisted living area.

## 2023-07-14 NOTE — ED Notes (Signed)
 Patient transported to CT

## 2023-07-14 NOTE — ED Provider Notes (Signed)
.-----------------------------------------   3:33 PM on 07/14/2023 -----------------------------------------  Blood pressure 131/85, pulse 95, temperature 97.6 F (36.4 C), resp. rate 19, weight 72.5 kg, SpO2 100%.  Assuming care from Dr. Arnoldo Morale.  In short, [name] [name] is a 88 y.o. female with a chief complaint of Weakness .  Refer to the original H&P for additional details.  The current plan of care is to follow-up CT PE study.  Independent review of labs imaging, CT PE study returned, no evidence of PE but did show bilateral pleural effusions with some compressive atelectasis.  Her BNP is also elevated.  Her Theodis Aguas is elevated but this is chronic elevation.  Given the evidence of volume overload as well as her worsening dyspnea on exertion and weakness, she will need to come in for further management and diuresis.  On independent review of her medications, she is on 20 mg of Lasix, will give her 40 mg of IV Lasix here.  Consult to hospitalist was agreeable to plan for admission will evaluate the patient.  She is admitted.   Clinical Course as of 07/14/23 1720  Fri Jul 14, 2023  1349 Nurse stated that the patient was able to ambulate with a walker.  No findings of urinary tract infection. [SM]    Clinical Course User Index [SM] Corena Herter, MD      Claybon Jabs, MD 07/14/23 [phone]

## 2023-07-14 NOTE — ED Provider Notes (Signed)
 [facility] Provider Note    Event Date/Time   First MD Initiated Contact with Patient 07/14/23 1201     (approximate)   History   Weakness   HPI  [name] [name] is a 88 y.o. female past medical history significant for HFrEF with an EF of 25%, diabetes, hyperlipidemia, hypertension, dementia, who presents to the emergency department with not feeling well with generalized weakness.  History is provided by the patient's daughter who is at bedside.  States that she was admitted in December for gait instability and dizziness and feeling weak.  States that since that time she has had intermittent episodes of feeling very weak and dizzy.  Was evaluated multiple times in the emergency department over the past couple of weeks.  Daughter stated that today she was unable to get her up from her chair secondary to generalized weakness.  She normally ambulates with a walker over the past couple of weeks.  States that she has ongoing dizziness and feels very dizzy, short of breath and weak whenever she does minimal exertion.  Does state that she feels dizzy at this time.  Denies any dysuria, urinary urgency or frequency.  No fever.  Complaining of significant pain to her left foot.  States that she had an x-ray of her left foot and her an x-ray of her right hip that did not result yet.  Denies any new fall over the past 1 week.  No new medication.     Physical Exam   Triage Vital Signs: ED Triage Vitals [07/14/23 1015]  Encounter Vitals Group     BP 131/85     Systolic BP Percentile      Diastolic BP Percentile      Pulse Rate 95     Resp 16     Temp 98 F (36.7 C)     Temp Source Oral     SpO2 95 %     Weight 159 lb 13.3 oz (72.5 kg)     Height      Head Circumference      Peak Flow      Pain Score      Pain Loc      Pain Education      Exclude from Growth Chart     Most recent vital signs: Vitals:   07/14/23 1017 07/14/23 1215  BP:    Pulse:  95  Resp:  19   Temp: 97.6 F (36.4 C)   SpO2:  100%    Physical Exam Constitutional:      Appearance: She is well-developed.  HENT:     Head: Atraumatic.  Eyes:     Extraocular Movements: Extraocular movements intact.     Conjunctiva/sclera: Conjunctivae normal.     Pupils: Pupils are equal, round, and reactive to light.  Cardiovascular:     Rate and Rhythm: Regular rhythm.  Pulmonary:     Effort: No respiratory distress.     Breath sounds: No wheezing.  Abdominal:     General: There is no distension.     Tenderness: There is no abdominal tenderness.  Musculoskeletal:        General: Normal range of motion.     Cervical back: Normal range of motion.     Comments: Tenderness palpation to the dorsal surface of the left foot.  No overlying findings of erythema or warmth.  +2 DP pulses.  No unilateral leg swelling.  No edema to lower extremities.  Skin:  General: Skin is warm.     Capillary Refill: Capillary refill takes less than 2 seconds.  Neurological:     Mental Status: She is alert. Mental status is at baseline.     Comments: Able to hold up both hands to gravity.  Able to hold bilateral lower extremities up to gravity.     IMPRESSION / MDM / ASSESSMENT AND PLAN / ED COURSE  I reviewed the triage vital signs and the nursing notes.  Differential diagnosis including dehydration, electrolyte abnormality, intracranial hemorrhage, ACS, anemia, pulmonary embolism, urinary tract infection, CVA   EKG  I, Corena Herter, the attending physician, personally viewed and interpreted this ECG.  EKG showed normal sinus rhythm.  Heart rate 96.  QTc 505.  No significant ST elevation or depression.  No significant change when compared to prior EKG.  No tachycardic or bradycardic dysrhythmias while on cardiac telemetry.  RADIOLOGY I independently reviewed imaging, my interpretation of imaging: CT scan of the head with no signs of intracranial hemorrhage.  LABS (all labs ordered are listed,  but only abnormal results are displayed) Labs interpreted as -    Labs Reviewed  BASIC METABOLIC PANEL WITH GFR - Abnormal; Notable for the following components:      Result Value   CO2 20 (*)    Glucose, Bld 138 (*)    Creatinine, Ser 1.11 (*)    GFR, Estimated 48 (*)    All other components within normal limits  CBC - Abnormal; Notable for the following components:   RDW 16.3 (*)    All other components within normal limits  URINALYSIS, ROUTINE W REFLEX MICROSCOPIC - Abnormal; Notable for the following components:   Color, Urine AMBER (*)    APPearance HAZY (*)    Protein, ur >=300 (*)    Leukocytes,Ua TRACE (*)    All other components within normal limits  TROPONIN I (HIGH SENSITIVITY) - Abnormal; Notable for the following components:   Troponin I (High Sensitivity) 239 (*)    All other components within normal limits  CBG MONITORING, ED     MDM   Clinical Course as of 07/14/23 1456  Fri Jul 14, 2023  1349 Nurse stated that the patient was able to ambulate with a walker.  No findings of urinary tract infection. [SM]    Clinical Course User Index [SM] Corena Herter, MD   X-ray of the foot with no acute fracture or dislocation.  Troponin elevated at 239 which appears to be chronically elevated for the patient.  Does have a history of heart failure.  Does state that she had significant weakness and was not feeling well with some shortness of breath after minimal exertion with her walker.  No findings that are significantly concerning for urinary tract infection.  No significant electrolyte abnormality.  CTA ordered to further evaluate for pulmonary embolism as a cause as her exertional dyspnea, weakness and elevated troponin.  PROCEDURES:  Critical Care performed: No  Procedures  Patient's presentation is most consistent with acute presentation with potential threat to life or bodily function.   MEDICATIONS ORDERED IN ED: Medications  acetaminophen (TYLENOL) tablet  1,000 mg (1,000 mg Oral Given 07/14/23 1341)    FINAL CLINICAL IMPRESSION(S) / ED DIAGNOSES   Final diagnoses:  Weakness     Rx / DC Orders   ED Discharge Orders     None        Note:  This document was prepared using Dragon voice recognition software and may include  unintentional dictation errors.   Corena Herter, MD 07/14/23 [phone]

## 2023-07-15 ENCOUNTER — Encounter: Payer: Self-pay | Admitting: Internal Medicine

## 2023-07-15 DIAGNOSIS — I5023 Acute on chronic systolic (congestive) heart failure: Secondary | ICD-10-CM | POA: Diagnosis not present

## 2023-07-15 DIAGNOSIS — F0390 Unspecified dementia without behavioral disturbance: Secondary | ICD-10-CM | POA: Diagnosis not present

## 2023-07-15 DIAGNOSIS — I4729 Other ventricular tachycardia: Secondary | ICD-10-CM | POA: Insufficient documentation

## 2023-07-15 LAB — MAGNESIUM: Magnesium: 1.6 mg/dL — ABNORMAL LOW (ref 1.7–2.4)

## 2023-07-15 LAB — BASIC METABOLIC PANEL WITH GFR
Anion gap: 12 (ref 5–15)
BUN: 20 mg/dL (ref 8–23)
CO2: 20 mmol/L — ABNORMAL LOW (ref 22–32)
Calcium: 9.3 mg/dL (ref 8.9–10.3)
Chloride: 103 mmol/L (ref 98–111)
Creatinine, Ser: 1.26 mg/dL — ABNORMAL HIGH (ref 0.44–1.00)
GFR, Estimated: 42 mL/min — ABNORMAL LOW (ref >60)
Glucose, Bld: 102 mg/dL — ABNORMAL HIGH (ref 70–99)
Potassium: 4.4 mmol/L (ref 3.5–5.1)
Sodium: 135 mmol/L (ref 135–145)

## 2023-07-15 LAB — GLUCOSE, CAPILLARY
Glucose-Capillary: 160 mg/dL — ABNORMAL HIGH (ref 70–99)
Glucose-Capillary: 128 mg/dL — ABNORMAL HIGH (ref 70–99)
Glucose-Capillary: 153 mg/dL — ABNORMAL HIGH (ref 70–99)
Glucose-Capillary: 122 mg/dL — ABNORMAL HIGH (ref 70–99)

## 2023-07-15 MED ORDER — MAGNESIUM SULFATE 2 GM/50ML IV SOLN
2.0000 g | Freq: Once | INTRAVENOUS | Status: AC
  Administered 2023-07-15: 2 g via INTRAVENOUS
  Filled 2023-07-15: qty 50

## 2023-07-15 MED ORDER — DAPAGLIFLOZIN PROPANEDIOL 10 MG PO TABS
10.0000 mg | ORAL_TABLET | Freq: Every day | ORAL | Status: DC
  Administered 2023-07-15 – 2023-07-19 (×5): 10 mg via ORAL
  Filled 2023-07-15 (×5): qty 1

## 2023-07-15 NOTE — Hospital Course (Signed)
 [name] [name] is a 88 y.o. female with medical history significant of essential hypertension, history of stroke, type 2 diabetes, chronic systolic congestive heart failure with ejection fraction 25%, dementia, who presents to the hospital with generalized weakness and shortness of breath.  CT scan ruled out PE, showed bilateral pleural effusion more on the right side.  Elevated BNP.  Patient was also with acute on chronic CHF, started on IV Lasix.

## 2023-07-15 NOTE — TOC Initial Note (Signed)
 Transition of Care [facility]) - Initial/Assessment Note    Patient Details  Name: [name] [name] MRN: [id number] Date of Birth: 10-Aug-1935  [facility]) CM/SW Contact:    Rodney Langton, RN Phone Number: 07/15/2023, 4:18 PM  Clinical Narrative:                  Patient admitted from [facility], [place].  Recommendations for home health PT/OT, however daughter expresses concern regarding patient living alone and safely/adequately being able to care for self.  She feel patient would benefit from SNF for rehab services to get stronger.  Patient does not have Medicaid to have PCS, daughter will see if insurance offers any long term benefits or aide coverage.  PT and MD aware of daughter's concern, per PT patient does not want to go to SNF.  PT will see again tomorrow for updated assessment, TOC will follow with patient and daughter for discharge planning.   Expected Discharge Plan: Home w Home Health Services Barriers to Discharge: Continued Medical Work up   Patient Goals and CMS Choice            Expected Discharge Plan and Services     Post Acute Care Choice: Durable Medical Equipment Living arrangements for the past 2 months: Independent Living Facility                                      Prior Living Arrangements/Services Living arrangements for the past 2 months: Independent Living Facility Lives with:: Self Patient language and need for interpreter reviewed:: Yes        Need for Family Participation in Patient Care: Yes (Comment) Care giver support system in place?: Yes (comment)   Criminal Activity/Legal Involvement Pertinent to Current Situation/Hospitalization: No - Comment as needed  Activities of Daily Living   ADL Screening (condition at time of admission) Independently performs ADLs?: Yes (appropriate for developmental age) Is the patient deaf or have difficulty hearing?: No Does the patient have difficulty seeing, even when wearing  glasses/contacts?: No Does the patient have difficulty concentrating, remembering, or making decisions?: No  Permission Sought/Granted   Permission granted to share information with : Yes, Verbal Permission Granted              Emotional Assessment              Admission diagnosis:  Shortness of breath [R06.02] Weakness [R53.1] Acute on chronic systolic (congestive) heart failure (HCC) [I50.23] Congestive heart failure, unspecified HF chronicity, unspecified heart failure type (HCC) [I50.9] Patient Active Problem List   Diagnosis Date Noted   Hypomagnesemia 07/15/2023   NSVT (nonsustained ventricular tachycardia) (HCC) 07/15/2023   Acute on chronic systolic (congestive) heart failure (HCC) 07/14/2023   Bilateral lower extremity edema 04/01/2023   Lightheadedness 04/01/2023   RVF (right ventricular failure) (HCC) 04/01/2023   Acute on chronic systolic CHF (congestive heart failure) (HCC) 03/31/2023   Acute on chronic systolic heart failure (HCC) 03/31/2023   History of CVA (cerebrovascular accident) 03/31/2023   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) 07/06/2020   Troponin level elevated 02/03/2018   Diabetes (HCC) 09/12/2017   Hyperlipidemia 09/12/2017   PAD (peripheral artery disease) (HCC) 06/09/2017   Essential hypertension 06/09/2014   H/O: duodenal ulcer 06/13/2013   Personal history of breast cancer 06/13/2013   PCP:  Lynnea Ferrier, MD Pharmacy:   [facility] [phone] - [place], [place] -  [address] [place] [address] Phone: [phone] Fax: [phone]     Social Drivers of Health (SDOH) Social History: SDOH Screenings   Food Insecurity: No Food Insecurity (07/14/2023)  Housing: Unknown (07/14/2023)  Transportation Needs: No Transportation Needs (07/14/2023)  Utilities: Not At Risk (07/14/2023)  Financial Resource Strain: Low Risk  (02/21/2023)   Received from [facility]  Social Connections: Unknown  (07/15/2023)  Tobacco Use: Medium Risk (07/09/2023)   SDOH Interventions:     Readmission Risk Interventions     No data to display

## 2023-07-15 NOTE — Evaluation (Signed)
 Physical Therapy Evaluation Patient Details Name: [name] [name] MRN: [id number] DOB: 1935/09/06 Today's Date: 07/15/2023  History of Present Illness  Pt is an 88 y.o. female presenting to hospital 07/14/23 with c/o generalized weakness, dizziness, and SOB.  Also c/o significant pain to L foot (no acute fx or dislocation noted on imaging).  Pt admitted with acute on chronic combined systolic diastolic CHF, lateral pleural effusion secondary to CHF, CKD, mild metabolic acidosis.  PMH includes htn, h/o stroke, DM type 2, chronic systolic CHF, dementia, R breast CA, s/p R lumpectomy, B knee sx.  Clinical Impression  Prior to recent medical concerns, pt reports being modified independent ambulating with rollator; lives [facility] at [facility].  Pt sitting on BSC upon PT arrival (pt had just finished toileting).  Currently pt is CGA with transfers; CGA to ambulate 16 feet with RW use; and SBA sit to semi-supine in bed.  Pt c/o weakness and dizziness during ambulation; dizziness resolved once sitting; (BP 136/94 with HR 92 bpm in sitting).  Pt would currently benefit from skilled PT to address noted impairments and functional limitations (see below for any additional details).  Upon hospital discharge, pt would benefit from ongoing therapy.  Pt reports she does not want to go to SNF but would like to transition to ALF (TOC and MD updated).    If plan is discharge home, recommend the following: A little help with walking and/or transfers;A little help with bathing/dressing/bathroom;Assistance with cooking/housework;Assist for transportation;Help with stairs or ramp for entrance   Can travel by private vehicle        Equipment Recommendations Rolling walker (2 wheels)  Recommendations for Other Services  OT consult    Functional Status Assessment Patient has had a recent decline in their functional status and demonstrates the ability to make significant improvements in function in a  reasonable and predictable amount of time.     Precautions / Restrictions Precautions Precautions: Fall Restrictions Weight Bearing Restrictions Per Provider Order: No      Mobility  Bed Mobility Overal bed mobility: Needs Assistance Bed Mobility: Sit to Supine       Sit to supine: Supervision, HOB elevated   General bed mobility comments: mild increased effort to perform on own    Transfers Overall transfer level: Needs assistance Equipment used: Rolling walker (2 wheels), None Transfers: Sit to/from Stand, Bed to chair/wheelchair/BSC Sit to Stand: Contact guard assist   Step pivot transfers: Contact guard assist (BSC to bed; UE support as needed)       General transfer comment: mild increased effort to stand from bed; steady with RW use    Ambulation/Gait Ambulation/Gait assistance: Contact guard assist Gait Distance (Feet): 16 Feet Assistive device: Rolling walker (2 wheels) Gait Pattern/deviations: Step-through pattern, Decreased step length - right, Decreased step length - left Gait velocity: decreased     General Gait Details: steady ambulation with RW; limited d/t pt fatigue/generalized weakness and c/o dizziness  Stairs            Wheelchair Mobility     Tilt Bed    Modified Rankin (Stroke Patients Only)       Balance Overall balance assessment: Needs assistance Sitting-balance support: No upper extremity supported, Feet supported Sitting balance-Leahy Scale: Good Sitting balance - Comments: steady reaching within BOS   Standing balance support: Bilateral upper extremity supported, During functional activity, Reliant on assistive device for balance Standing balance-Leahy Scale: Good Standing balance comment: steady ambulating with RW use  Pertinent Vitals/Pain Pain Assessment Pain Assessment: Faces Faces Pain Scale: Hurts little more (0/10 at rest; 4/10 with ambulation) Pain Location: L foot  when walking Pain Descriptors / Indicators: Discomfort Pain Intervention(s): Limited activity within patient's tolerance, Monitored during session HR 92-100 bpm and SpO2 sats 98% or greater on room air during session.    Home Living Family/patient expects to be discharged to:: Other (Comment) Living Arrangements: Alone                 Additional Comments: [facility].  3rd floor apt (elevator access).    Prior Function Prior Level of Function : Independent/Modified Independent             Mobility Comments: Modified independent ambulating with rollator.  No recent falls reported.       Extremity/Trunk Assessment   Upper Extremity Assessment Upper Extremity Assessment: Overall WFL for tasks assessed    Lower Extremity Assessment Lower Extremity Assessment: Generalized weakness       Communication   Communication Communication: No apparent difficulties    Cognition Arousal: Alert Behavior During Therapy: WFL for tasks assessed/performed                           PT - Cognition Comments: Oriented to at least person, hospital, and general situation (date not asked) Following commands: Intact       Cueing Cueing Techniques: Verbal cues     General Comments      Exercises     Assessment/Plan    PT Assessment Patient needs continued PT services  PT Problem List Decreased strength;Decreased activity tolerance;Decreased balance;Decreased mobility;Pain       PT Treatment Interventions DME instruction;Gait training;Functional mobility training;Therapeutic activities;Therapeutic exercise;Balance training;Patient/family education    PT Goals (Current goals can be found in the Care Plan section)  Acute Rehab PT Goals Patient Stated Goal: to improve overall strength PT Goal Formulation: With patient Time For Goal Achievement: 07/29/23 Potential to Achieve Goals: Good    Frequency Min 2X/week     Co-evaluation                AM-PAC PT "6 Clicks" Mobility  Outcome Measure Help needed turning from your back to your side while in a flat bed without using bedrails?: None Help needed moving from lying on your back to sitting on the side of a flat bed without using bedrails?: A Little Help needed moving to and from a bed to a chair (including a wheelchair)?: A Little Help needed standing up from a chair using your arms (e.g., wheelchair or bedside chair)?: A Little Help needed to walk in hospital room?: A Little Help needed climbing 3-5 steps with a railing? : A Little 6 Click Score: 19    End of Session Equipment Utilized During Treatment: Gait belt Activity Tolerance: Patient limited by fatigue Patient left: in bed;with call bell/phone within reach;with bed alarm set Nurse Communication: Mobility status;Precautions PT Visit Diagnosis: Other abnormalities of gait and mobility (R26.89);Muscle weakness (generalized) (M62.81);Pain Pain - Right/Left: Left Pain - part of body: Ankle and joints of foot    Time: 0841-0900 PT Time Calculation (min) (ACUTE ONLY): 19 min   Charges:   PT Evaluation $PT Eval Low Complexity: 1 Low   PT General Charges $$ ACUTE PT VISIT: 1 Visit        Hendricks Limes, PT 07/15/23, 9:29 AM

## 2023-07-15 NOTE — Progress Notes (Addendum)
  Progress Note   Patient: [name] [name] ZOX:[id number] DOB: 1935-10-03 DOA: 07/14/2023     1 DOS: the patient was seen and examined on 07/15/2023   Brief hospital course: Teran Zender is a 88 y.o. female with medical history significant of essential hypertension, history of stroke, type 2 diabetes, chronic systolic congestive heart failure with ejection fraction 25%, dementia, who presents to the hospital with generalized weakness and shortness of breath.  CT scan ruled out PE, showed bilateral pleural effusion more on the right side.  Elevated BNP.  Patient was also with acute on chronic CHF, started on IV Lasix.   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 Bilateral pleural effusion secondary to congestive heart failure. Reviewed echocardiogram performed in December 2024, ejection fraction was 20 to 25% with grade 3 diastolic dysfunction. Patient had a second short of breath, chest CT scan had a pleural effusion, evaded BNP. Patient was treated with Aldactone IV Lasix, ARB.  Will start beta-Argyle when condition improves.  Add on Farxiga.   Chronic kidney disease stage IIIa Mild metabolic acidosis. Hypomagnesemia. Renal function slightly worsened, will continue Lasix, follow renal function closely. Replete magnesium.   Essential hypertension. Continue ARB.   Type 2 diabetes. Start sliding scale insulin   General Weakness. Seen by PT/OT, recommend home health.   Dementia. Follow closely, high risk for delirium.   Nonsustained ventricular tachycardia. Patient had 7 beats of V. tach this afternoon, will continue to monitor, correct electrolytes.     Subjective:  Patient had multiple  urination, but not recorded that patient was incontinent. Slept better last night, without significant paroxysmal nocturnal dyspnea.  Physical Exam: Vitals:   07/14/23 2051 07/15/23 0005 07/15/23 0500 07/15/23 0859  BP: (!) 132/91 (!) 121/90  (!) 136/94  Pulse: 97 92  92  Resp: 20 16  18   Temp: 97.8 F (36.6 C)   98.8 F (37.1 C)  TempSrc: Oral   Oral  SpO2: 100% 100%    Weight:   71.9 kg    General exam: Appears calm and comfortable  Respiratory system: Few crackles, but improving. Respiratory effort normal. Cardiovascular system: S1 & S2 heard, RRR. No JVD, murmurs, rubs, gallops or clicks.  Gastrointestinal system: Abdomen is nondistended, soft and nontender. No organomegaly or masses felt. Normal bowel sounds heard. Central nervous system: Alert and oriented x2. No focal neurological deficits. Extremities: Trace leg edema. Skin: No rashes, lesions or ulcers Psychiatry: Judgement and insight appear normal. Mood & affect appropriate.    Data Reviewed:  Lab results reviewed.  Family Communication: Daughter updated in room  Disposition: Status is: Inpatient Remains inpatient appropriate because: Severity of disease, IV treatment.     Time spent: 35 minutes  Author: Marrion Coy, MD 07/15/2023 11:27 AM  For on call review [email or web address].

## 2023-07-15 NOTE — Evaluation (Signed)
 Occupational Therapy Evaluation Patient Details Name: [name] [name] MRN: [id number] DOB: Jun 27, 1935 Today's Date: 07/15/2023   History of Present Illness   Pt is an 88 year old female presenting with generalized weakness and SOB, admitted with Acute on chronic combined systolic diastolic congestive heart failure, Lateral pleural effusion secondary to congestive heart failure.      PMH significant for ssential hypertension, history of stroke, type 2 diabetes, chronic systolic congestive heart failure with ejection fraction 25%, dementia     Clinical Impressions Chart reviewed, pt greeted in bed, alert and oriented x4, agreeable to OT evaluation. PTA pt reports she lives at ILF at [facility], generally MOD I with dressing, feeding, grooming, bathing (sits on shower chair), has assist for IADL from daughter/grand daughter, amb with rollator. Pt presents with deficits in strength, activity tolerance, balance, endurance affecting safe and optimal ADL completion. She is performing ADL/ mobility below PLOF. Bed mobility completed with supervision, STS with supervision-CGA, amb into bathroom with RW with supervision-CGA, toileting with supervision. Pt is encouraged to progress mobility with staff including out of bed for meals when she requests to return to bed. Pt is left in bed, all needs met. OT will follow acutely to facilitate optimal ADL performance.       If plan is discharge home, recommend the following:   A little help with bathing/dressing/bathroom;Help with stairs or ramp for entrance;Assistance with cooking/housework     Functional Status Assessment   Patient has had a recent decline in their functional status and demonstrates the ability to make significant improvements in function in a reasonable and predictable amount of time.     Equipment Recommendations   None recommended by OT     Recommendations for Other Services         Precautions/Restrictions    Precautions Precautions: Fall Recall of Precautions/Restrictions: Intact Restrictions Weight Bearing Restrictions Per Provider Order: No     Mobility Bed Mobility Overal bed mobility: Needs Assistance Bed Mobility: Sit to Supine, Supine to Sit     Supine to sit: Supervision, HOB elevated Sit to supine: Supervision, HOB elevated        Transfers Overall transfer level: Needs assistance Equipment used: Rolling walker (2 wheels) Transfers: Sit to/from Stand Sit to Stand: Supervision, Contact guard assist                  Balance Overall balance assessment: Needs assistance Sitting-balance support: No upper extremity supported, Feet supported Sitting balance-Leahy Scale: Good     Standing balance support: Bilateral upper extremity supported, During functional activity, Reliant on assistive device for balance Standing balance-Leahy Scale: Good                             ADL either performed or assessed with clinical judgement   ADL Overall ADL's : Needs assistance/impaired Eating/Feeding: Set up;Sitting   Grooming: Set up;Sitting               Lower Body Dressing: Contact guard assist;Sitting/lateral leans Lower Body Dressing Details (indicate cue type and reason): anticipate Toilet Transfer: Supervision/safety;Contact guard assist;Rolling walker (2 wheels);Regular Toilet;Grab bars;Cueing for sequencing   Toileting- Clothing Manipulation and Hygiene: Supervision/safety;Sitting/lateral lean Toileting - Clothing Manipulation Details (indicate cue type and reason): continent urine on toilet     Functional mobility during ADLs: Contact guard assist;Supervision/safety;Rolling walker (2 wheels) (approx 10' two attempts)       Vision Patient Visual Report: No change from baseline  Perception         Praxis         Pertinent Vitals/Pain Pain Assessment Pain Assessment: No/denies pain Pain Intervention(s): Monitored during session      Extremity/Trunk Assessment Upper Extremity Assessment Upper Extremity Assessment: Overall WFL for tasks assessed   Lower Extremity Assessment Lower Extremity Assessment: Generalized weakness       Communication Communication Communication: No apparent difficulties   Cognition Arousal: Alert Behavior During Therapy: WFL for tasks assessed/performed Cognition: No apparent impairments                               Following commands: Intact       Cueing  General Comments   Cueing Techniques: Verbal cues  vss throughout   Exercises Other Exercises Other Exercises: edu re: role of OT, role of rehab, discharge recommendations, safe ADL completion with DME   Shoulder Instructions      Home Living Family/patient expects to be discharged to:: Private residence Living Arrangements: Alone                           Home Equipment: Rollator (4 wheels);Shower seat   Additional Comments: [facility].  3rd floor apt (elevator access).      Prior Functioning/Environment Prior Level of Function : Independent/Modified Independent             Mobility Comments: MOD I with rollator ADLs Comments: MOD I with dressing, feeding, grooming, sits on shower chair in shower; assist for IADLs- pt reports meals brought to her apt, grand daugther assists with med management, daugther/grand daugther drive her    OT Problem List: Decreased strength;Decreased activity tolerance;Decreased knowledge of use of DME or AE;Decreased safety awareness;Impaired balance (sitting and/or standing)   OT Treatment/Interventions: Self-care/ADL training;Therapeutic exercise;Patient/family education;Balance training;Energy conservation;Therapeutic activities;DME and/or AE instruction      OT Goals(Current goals can be found in the care plan section)   Acute Rehab OT Goals Patient Stated Goal: improve function OT Goal Formulation: With patient Time For  Goal Achievement: 07/29/23 Potential to Achieve Goals: Good ADL Goals Pt Will Perform Grooming: with modified independence;sitting;standing Pt Will Perform Lower Body Dressing: with modified independence;sit to/from stand;sitting/lateral leans Pt Will Transfer to Toilet: with modified independence;ambulating Pt Will Perform Toileting - Clothing Manipulation and hygiene: with modified independence;sit to/from stand;sitting/lateral leans   OT Frequency:  Min 2X/week    Co-evaluation              AM-PAC OT "6 Clicks" Daily Activity     Outcome Measure Help from another person eating meals?: None Help from another person taking care of personal grooming?: None Help from another person toileting, which includes using toliet, bedpan, or urinal?: None Help from another person bathing (including washing, rinsing, drying)?: A Little Help from another person to put on and taking off regular upper body clothing?: None Help from another person to put on and taking off regular lower body clothing?: A Little 6 Click Score: 22   End of Session Equipment Utilized During Treatment: Rolling walker (2 wheels) Nurse Communication: Mobility status  Activity Tolerance: Patient tolerated treatment well Patient left: in bed;with call bell/phone within reach;with bed alarm set  OT Visit Diagnosis: Other abnormalities of gait and mobility (R26.89);Muscle weakness (generalized) (M62.81)                Time: 1610-9604 OT Time Calculation (  min): 16 min Charges:  OT General Charges $OT Visit: 1 Visit OT Evaluation $OT Eval Moderate Complexity: 1 Mod  Oleta Mouse, OTD OTR/L  07/15/23, 9:49 AM

## 2023-07-16 DIAGNOSIS — I4729 Other ventricular tachycardia: Secondary | ICD-10-CM

## 2023-07-16 DIAGNOSIS — I5023 Acute on chronic systolic (congestive) heart failure: Secondary | ICD-10-CM | POA: Diagnosis not present

## 2023-07-16 DIAGNOSIS — F0390 Unspecified dementia without behavioral disturbance: Secondary | ICD-10-CM | POA: Diagnosis not present

## 2023-07-16 LAB — MAGNESIUM: Magnesium: 1.9 mg/dL (ref 1.7–2.4)

## 2023-07-16 LAB — BASIC METABOLIC PANEL WITH GFR
Anion gap: 12 (ref 5–15)
BUN: 22 mg/dL (ref 8–23)
CO2: 21 mmol/L — ABNORMAL LOW (ref 22–32)
Calcium: 9.1 mg/dL (ref 8.9–10.3)
Chloride: 103 mmol/L (ref 98–111)
Creatinine, Ser: 1.41 mg/dL — ABNORMAL HIGH (ref 0.44–1.00)
GFR, Estimated: 36 mL/min — ABNORMAL LOW (ref >60)
Glucose, Bld: 100 mg/dL — ABNORMAL HIGH (ref 70–99)
Potassium: 3.6 mmol/L (ref 3.5–5.1)
Sodium: 136 mmol/L (ref 135–145)

## 2023-07-16 LAB — GLUCOSE, CAPILLARY
Glucose-Capillary: 114 mg/dL — ABNORMAL HIGH (ref 70–99)
Glucose-Capillary: 175 mg/dL — ABNORMAL HIGH (ref 70–99)
Glucose-Capillary: 173 mg/dL — ABNORMAL HIGH (ref 70–99)
Glucose-Capillary: 200 mg/dL — ABNORMAL HIGH (ref 70–99)

## 2023-07-16 MED ORDER — CARVEDILOL 3.125 MG PO TABS
3.1250 mg | ORAL_TABLET | Freq: Two times a day (BID) | ORAL | Status: DC
  Administered 2023-07-16 – 2023-07-19 (×5): 3.125 mg via ORAL
  Filled 2023-07-16 (×5): qty 1

## 2023-07-16 NOTE — Progress Notes (Addendum)
 Physical Therapy Treatment Patient Details Name: [name] [name] MRN: [id number] DOB: 12-19-1935 Today's Date: 07/16/2023   History of Present Illness Pt is an 88 year old female presenting with generalized weakness and SOB, admitted with Acute on chronic combined systolic diastolic congestive heart failure, Lateral pleural effusion secondary to congestive heart failure.      PMH significant for ssential hypertension, history of stroke, type 2 diabetes, chronic systolic congestive heart failure with ejection fraction 25%, dementia    PT Comments  Pt initially declining due to fatigue but agrees with encouragement.  Relied on bed rails and mod cues for encouragement to transition to sitting.  Steady once upright.  She is able to stand with cga/min a x 1 with cues for safety as she tries to pull up on walker.  She is able to walk 30' x 2 with RW and cga/min a x 1 and stop at [facility] to void.  Max encouragement to remain up in chair after session.  Long discussion with daughter regarding her concerns over discharge.  Stated pt was admitted last December and has had a general decline and has struggled more the past few months.  At times is unable to get out of bed on her own, unsteady gait, difficulty with ADL and general household tasks.  Lives at [facility] and is now taking meals in room as she is unable to make it to/from dining room.  Pt is needing increased assist at home and they are concerned and unable to provide increased support.  At this time, <3 hours a day at therapy is recommended to increase overall strength, safety, balance and functional independence to increase max independence and decrease caregiver burden.  Pt initially refused SNF upon eval so recommendations were for HHPT but she is now in agreement if available to her.   If plan is discharge home, recommend the following: A little help with walking and/or transfers;A little help with bathing/dressing/bathroom;Assistance with  cooking/housework;Assist for transportation;Help with stairs or ramp for entrance   Can travel by private vehicle        Equipment Recommendations  Rolling walker (2 wheels)    Recommendations for Other Services       Precautions / Restrictions Precautions Precautions: Fall Recall of Precautions/Restrictions: Intact Restrictions Weight Bearing Restrictions Per Provider Order: No     Mobility  Bed Mobility Overal bed mobility: Needs Assistance Bed Mobility: Supine to Sit     Supine to sit: Contact guard, Used rails     General bed mobility comments: mod cues and encouragement Patient Response: Cooperative  Transfers Overall transfer level: Needs assistance Equipment used: Rolling walker (2 wheels)   Sit to Stand: Contact guard assist           General transfer comment: poor hand palcements with cues to correct    Ambulation/Gait Ambulation/Gait assistance: Contact guard assist Gait Distance (Feet): 30 Feet Assistive device: Rolling walker (2 wheels) Gait Pattern/deviations: Step-through pattern, Decreased step length - right, Decreased step length - left Gait velocity: decreased     General Gait Details: 30' x 2  - self limiting   Stairs             Wheelchair Mobility     Tilt Bed Tilt Bed Patient Response: Cooperative  Modified Rankin (Stroke Patients Only)       Balance Overall balance assessment: Needs assistance Sitting-balance support: No upper extremity supported, Feet supported Sitting balance-Leahy Scale: Good     Standing balance support: Bilateral upper extremity  supported, During functional activity, Reliant on assistive device for balance Standing balance-Leahy Scale: Fair                              Hotel manager: No apparent difficulties  Cognition Arousal: Alert Behavior During Therapy: WFL for tasks assessed/performed   PT - Cognitive impairments: History of cognitive  impairments                         Following commands: Intact      Cueing Cueing Techniques: Verbal cues  Exercises Other Exercises Other Exercises: to [facility] to void    General Comments        Pertinent Vitals/Pain Pain Assessment Pain Assessment: No/denies pain Pain Location: no grimmacing or limping with L foot today with gait. Pain Intervention(s): Monitored during session    Home Living                          Prior Function            PT Goals (current goals can now be found in the care plan section) Progress towards PT goals: Progressing toward goals    Frequency    Min 2X/week      PT Plan      Co-evaluation              AM-PAC PT "6 Clicks" Mobility   Outcome Measure  Help needed turning from your back to your side while in a flat bed without using bedrails?: None Help needed moving from lying on your back to sitting on the side of a flat bed without using bedrails?: A Little Help needed moving to and from a bed to a chair (including a wheelchair)?: A Little Help needed standing up from a chair using your arms (e.g., wheelchair or bedside chair)?: A Little Help needed to walk in hospital room?: A Little Help needed climbing 3-5 steps with a railing? : A Lot 6 Click Score: 18    End of Session Equipment Utilized During Treatment: Gait belt Activity Tolerance: Patient tolerated treatment well Patient left: in chair;with chair alarm set;with call bell/phone within reach;with family/visitor present Nurse Communication: Mobility status;Precautions PT Visit Diagnosis: Other abnormalities of gait and mobility (R26.89);Muscle weakness (generalized) (M62.81);Pain     Time: 1251-1311 PT Time Calculation (min) (ACUTE ONLY): 20 min  Charges:    $Gait Training: 8-22 mins PT General Charges $$ ACUTE PT VISIT: 1 Visit                   Danielle Dess, PTA 07/16/23, 1:22 PM

## 2023-07-16 NOTE — Progress Notes (Signed)
  Progress Note   Patient: [name] [name] ZOX:[id number] DOB: 10-22-1935 DOA: 07/14/2023     2 DOS: the patient was seen and examined on 07/16/2023   Brief hospital course: [name] [name] is a 88 y.o. female with medical history significant of essential hypertension, history of stroke, type 2 diabetes, chronic systolic congestive heart failure with ejection fraction 25%, dementia, who presents to the hospital with generalized weakness and shortness of breath.  CT scan ruled out PE, showed bilateral pleural effusion more on the right side.  Elevated BNP.  Patient was also with acute on chronic CHF, started on IV Lasix.   Principal Problem:   Acute on chronic systolic (congestive) heart failure (HCC) Active Problems:   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)   Essential hypertension   History of CVA (cerebrovascular accident)   Hypomagnesemia   NSVT (nonsustained ventricular tachycardia) (HCC)   Assessment and Plan:  Acute on chronic combined systolic diastolic congestive heart failure. Bilateral pleural effusion secondary to congestive heart failure. Reviewed echocardiogram performed in December 2024, ejection fraction was 20 to 25% with grade 3 diastolic dysfunction. Patient had a second short of breath, chest CT scan had a pleural effusion, evaded BNP. 4/5. Patient was treated with Aldactone IV Lasix, ARB.  Will start beta-Maffett when condition improves.  Add on Farxiga. 4/6.  Volume status improved, discontinue IV Lasix, added Coreg.   Chronic kidney disease stage IIIa Mild metabolic acidosis. Hypomagnesemia. Renal function continue to get worse today, hold off Lasix.   Essential hypertension. Continue ARB.   Type 2 diabetes. Start sliding scale insulin   General Weakness. Seen by PT/OT, recommend home health.   Dementia. Follow closely, high risk for delirium.   Nonsustained ventricular tachycardia. Patient had 7 beats of V. tach this afternoon, will continue to  monitor, correct electrolytes.        Subjective:  Patient feels much better today, no significant short of breath.  Physical Exam: Vitals:   07/16/23 0300 07/16/23 0500 07/16/23 0748 07/16/23 1136  BP: 130/81  121/71 108/66  Pulse: 88  81 92  Resp:   13 16  Temp: 97.9 F (36.6 C)  97.7 F (36.5 C) 97.6 F (36.4 C)  TempSrc: Oral  Oral Oral  SpO2: 100%  100% 97%  Weight:  68.9 kg    Height:       General exam: Appears calm and comfortable  Respiratory system: Clear to auscultation. Respiratory effort normal. Cardiovascular system: S1 & S2 heard, RRR. No JVD, murmurs, rubs, gallops or clicks. No pedal edema. Gastrointestinal system: Abdomen is nondistended, soft and nontender. No organomegaly or masses felt. Normal bowel sounds heard. Central nervous system: Alert and oriented. No focal neurological deficits. Extremities: Symmetric 5 x 5 power. Skin: No rashes, lesions or ulcers Psychiatry: Judgement and insight appear normal. Mood & affect appropriate.    Data Reviewed:  Lab results reviewed.  Family Communication: Not able to reach daughter.  Disposition: Status is: Inpatient Remains inpatient appropriate because: Severity of disease.     Time spent: 35 minutes  Author: Marrion Coy, MD 07/16/2023 12:49 PM  For on call review [email or web address].

## 2023-07-16 NOTE — Plan of Care (Signed)

## 2023-07-17 DIAGNOSIS — I4729 Other ventricular tachycardia: Secondary | ICD-10-CM | POA: Diagnosis not present

## 2023-07-17 DIAGNOSIS — I5023 Acute on chronic systolic (congestive) heart failure: Secondary | ICD-10-CM | POA: Diagnosis not present

## 2023-07-17 DIAGNOSIS — F0390 Unspecified dementia without behavioral disturbance: Secondary | ICD-10-CM | POA: Diagnosis not present

## 2023-07-17 LAB — BASIC METABOLIC PANEL WITH GFR
Anion gap: 13 (ref 5–15)
BUN: 25 mg/dL — ABNORMAL HIGH (ref 8–23)
CO2: 22 mmol/L (ref 22–32)
Calcium: 9.1 mg/dL (ref 8.9–10.3)
Chloride: 103 mmol/L (ref 98–111)
Creatinine, Ser: 1.45 mg/dL — ABNORMAL HIGH (ref 0.44–1.00)
GFR, Estimated: 35 mL/min — ABNORMAL LOW (ref >60)
Glucose, Bld: 117 mg/dL — ABNORMAL HIGH (ref 70–99)
Potassium: 3.4 mmol/L — ABNORMAL LOW (ref 3.5–5.1)
Sodium: 138 mmol/L (ref 135–145)

## 2023-07-17 LAB — GLUCOSE, CAPILLARY
Glucose-Capillary: 146 mg/dL — ABNORMAL HIGH (ref 70–99)
Glucose-Capillary: 171 mg/dL — ABNORMAL HIGH (ref 70–99)
Glucose-Capillary: 182 mg/dL — ABNORMAL HIGH (ref 70–99)
Glucose-Capillary: 150 mg/dL — ABNORMAL HIGH (ref 70–99)

## 2023-07-17 LAB — MAGNESIUM: Magnesium: 1.9 mg/dL (ref 1.7–2.4)

## 2023-07-17 MED ORDER — POTASSIUM CHLORIDE CRYS ER 20 MEQ PO TBCR
40.0000 meq | EXTENDED_RELEASE_TABLET | Freq: Once | ORAL | Status: AC
  Administered 2023-07-17: 40 meq via ORAL
  Filled 2023-07-17: qty 2

## 2023-07-17 MED ORDER — ORAL CARE MOUTH RINSE: 15.0000 mL | OROMUCOSAL | Status: DC | PRN

## 2023-07-17 MED ORDER — ENOXAPARIN SODIUM 30 MG/0.3ML IJ SOSY
30.0000 mg | PREFILLED_SYRINGE | INTRAMUSCULAR | Status: DC
  Administered 2023-07-18: 30 mg via SUBCUTANEOUS
  Filled 2023-07-17 (×2): qty 0.3

## 2023-07-17 NOTE — Care Management Important Message (Signed)
 Important Message  Patient Details  Name: [name] [name] MRN: [id number] Date of Birth: 1936-02-05   Important Message Given:  Yes - Medicare IM     Marcell Anger 07/17/2023, 11:10 AM

## 2023-07-17 NOTE — Progress Notes (Signed)
  Progress Note   Patient: [name] [name] ZOX:[id number] DOB: Sep 26, 1935 DOA: 07/14/2023     3 DOS: the patient was seen and examined on 07/17/2023   Brief hospital course: Atiyana Mitcham is a 88 y.o. female with medical history significant of essential hypertension, history of stroke, type 2 diabetes, chronic systolic congestive heart failure with ejection fraction 25%, dementia, who presents to the hospital with generalized weakness and shortness of breath.  CT scan ruled out PE, showed bilateral pleural effusion more on the right side.  Elevated BNP.  Patient was also with acute on chronic CHF, started on IV Lasix.   Principal Problem:   Acute on chronic systolic (congestive) heart failure (HCC) Active Problems:   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)   Essential hypertension   History of CVA (cerebrovascular accident)   Hypomagnesemia   NSVT (nonsustained ventricular tachycardia) (HCC)   Assessment and Plan: Acute on chronic combined systolic diastolic congestive heart failure. Bilateral pleural effusion secondary to congestive heart failure. Reviewed echocardiogram performed in December 2024, ejection fraction was 20 to 25% with grade 3 diastolic dysfunction. Patient had a second short of breath, chest CT scan had a pleural effusion, evaded BNP. 4/5. Patient was treated with Aldactone IV Lasix, ARB.  Will start beta-Nogueira when condition improves.  Add on Farxiga. 4/6.  Volume status improved, discontinue IV Lasix, added Coreg. Patient condition has improved, volume status much better.  Continue to watch renal function.   Chronic kidney disease stage IIIa Mild metabolic acidosis. Hypomagnesemia. Hypokalemia. Continue hold Lasix for today, recheck a renal function tomorrow.  May consider restart oral Lasix tomorrow. Replete potassium.   Essential hypertension. Continue ARB.   Type 2 diabetes. Start sliding scale insulin   General Weakness. Seen by PT/OT,  recommend home health.   Dementia. Follow closely, high risk for delirium.   Nonsustained ventricular tachycardia. Patient had 7 beats of V. tach this afternoon 4/6.  No recurrence.       Subjective:  Patient doing well today, denies any short of breath or cough.  Physical Exam: Vitals:   07/17/23 0037 07/17/23 0339 07/17/23 0520 07/17/23 0833  BP: 113/76 114/81  (!) 111/90  Pulse: 79 83  78  Resp: 20 16    Temp: (!) 97.5 F (36.4 C) (!) 97.4 F (36.3 C)  98 F (36.7 C)  TempSrc:    Oral  SpO2: 100% 100%  100%  Weight:   71.2 kg   Height:       General exam: Appears calm and comfortable  Respiratory system: Clear to auscultation. Respiratory effort normal. Cardiovascular system: S1 & S2 heard, RRR. No JVD, murmurs, rubs, gallops or clicks. No pedal edema. Gastrointestinal system: Abdomen is nondistended, soft and nontender. No organomegaly or masses felt. Normal bowel sounds heard. Central nervous system: Alert and oriented. No focal neurological deficits. Extremities: Symmetric 5 x 5 power. Skin: No rashes, lesions or ulcers Psychiatry: Judgement and insight appear normal. Mood & affect appropriate.    Data Reviewed:  Results reviewed.  Family Communication: Daughter updated over the phone.  Disposition: Status is: Inpatient Remains inpatient appropriate because: Severity of disease.     Time spent: 35 minutes  Author: Marrion Coy, MD 07/17/2023 10:32 AM  For on call review [email or web address].

## 2023-07-17 NOTE — Progress Notes (Signed)
 Heart Failure Navigator Progress Note  Assessed for Heart & Vascular TOC clinic readiness.  Patient does not meet criteria due to current Advanced Heart Failure patient of Dr. Dorthula Nettles, DO.  Navigator will sign off at this time.  Roxy Horseman, RN, BSN [facility] Heart Failure Navigator Secure Chat Only

## 2023-07-17 NOTE — Progress Notes (Signed)
 Physical Therapy Treatment Patient Details Name: [name] [name] MRN: [id number] DOB: 07-05-1935 Today's Date: 07/17/2023   History of Present Illness Pt is an 88 year old female presenting with generalized weakness and SOB, admitted with Acute on chronic combined systolic diastolic congestive heart failure, Lateral pleural effusion secondary to congestive heart failure.      PMH significant for ssential hypertension, history of stroke, type 2 diabetes, chronic systolic congestive heart failure with ejection fraction 25%, dementia    PT Comments  Pt initially declining therapy but agrees with max encouragement.  Sitting EOB with cga x 1 and rails.  Stood with min a x 1 to RW and upon start of walking pt noted to have wet gown.  She stated she has peed in the bed 4 times this am but did not alert staff "I'm just going to do it again".  She is able walk 50' x 1  with RW and min a x 1 before stating "I'm going to fall".  Gait quality remained good with no buckling or signs of weakness but she is sat in chair in hallway for short rest before walking back to room.  Pt encouraged to sit in the recliner as bed it wet.  "Can I pee once I sit in the recliner?"  Pt is directed to [facility] and is able to void and bath is given with new linens. Pt is able to participate in self care in sitting and standing with encouragement.   Educated pt on importance of not being in wet bed, using BSC as needed with staff and increasing time OOB for strength and skin integrity.  Pt remained in chair after session with needs met.  Pt continues to self limit mobility in general during tasks with nursing and therapy.     If plan is discharge home, recommend the following: A little help with walking and/or transfers;A little help with bathing/dressing/bathroom;Assistance with cooking/housework;Assist for transportation;Help with stairs or ramp for entrance   Can travel by private vehicle        Equipment Recommendations  Rolling walker  (2 wheels)    Recommendations for Other Services       Precautions / Restrictions Precautions Precautions: Fall Recall of Precautions/Restrictions: Intact Restrictions Weight Bearing Restrictions Per Provider Order: No     Mobility  Bed Mobility Overal bed mobility: Needs Assistance Bed Mobility: Supine to Sit     Supine to sit: Contact guard, Used rails     General bed mobility comments: mod cues and encouragement Patient Response: Cooperative  Transfers Overall transfer level: Needs assistance Equipment used: Rolling walker (2 wheels) Transfers: Sit to/from Stand Sit to Stand: Contact guard assist                Ambulation/Gait Ambulation/Gait assistance: Contact guard assist Gait Distance (Feet): 50 Feet Assistive device: Rolling walker (2 wheels) Gait Pattern/deviations: Step-through pattern, Decreased step length - right, Decreased step length - left Gait velocity: decreased     General Gait Details: 60' 15'   Stairs             Wheelchair Mobility     Tilt Bed Tilt Bed Patient Response: Cooperative  Modified Rankin (Stroke Patients Only)       Balance Overall balance assessment: Needs assistance Sitting-balance support: No upper extremity supported, Feet supported Sitting balance-Leahy Scale: Good     Standing balance support: Bilateral upper extremity supported, During functional activity, Reliant on assistive device for balance Standing balance-Leahy Scale: Fair  Communication Communication Communication: No apparent difficulties  Cognition Arousal: Alert Behavior During Therapy: WFL for tasks assessed/performed   PT - Cognitive impairments: History of cognitive impairments                       PT - Cognition Comments: max encouragement to participate today Following commands: Intact      Cueing Cueing Techniques: Verbal cues  Exercises Other Exercises Other  Exercises: to [facility] to void and for bathing as she is inc of urine in bed and wet throughout    General Comments        Pertinent Vitals/Pain Pain Assessment Pain Assessment: No/denies pain    Home Living                          Prior Function            PT Goals (current goals can now be found in the care plan section) Progress towards PT goals: Progressing toward goals    Frequency    Min 2X/week      PT Plan      Co-evaluation              AM-PAC PT "6 Clicks" Mobility   Outcome Measure  Help needed turning from your back to your side while in a flat bed without using bedrails?: None Help needed moving from lying on your back to sitting on the side of a flat bed without using bedrails?: A Little Help needed moving to and from a bed to a chair (including a wheelchair)?: A Little Help needed standing up from a chair using your arms (e.g., wheelchair or bedside chair)?: A Little Help needed to walk in hospital room?: A Little Help needed climbing 3-5 steps with a railing? : A Lot 6 Click Score: 18    End of Session Equipment Utilized During Treatment: Gait belt Activity Tolerance: Patient tolerated treatment well Patient left: in chair;with chair alarm set;with call bell/phone within reach;with family/visitor present Nurse Communication: Mobility status;Precautions PT Visit Diagnosis: Other abnormalities of gait and mobility (R26.89);Muscle weakness (generalized) (M62.81);Pain     Time: 1610-9604 PT Time Calculation (min) (ACUTE ONLY): 21 min  Charges:    $Gait Training: 8-22 mins PT General Charges $$ ACUTE PT VISIT: 1 Visit                   Danielle Dess, PTA 07/17/23, 11:19 AM

## 2023-07-18 DIAGNOSIS — I4729 Other ventricular tachycardia: Secondary | ICD-10-CM | POA: Diagnosis not present

## 2023-07-18 DIAGNOSIS — I5023 Acute on chronic systolic (congestive) heart failure: Secondary | ICD-10-CM | POA: Diagnosis not present

## 2023-07-18 DIAGNOSIS — F0390 Unspecified dementia without behavioral disturbance: Secondary | ICD-10-CM | POA: Diagnosis not present

## 2023-07-18 LAB — BASIC METABOLIC PANEL WITH GFR
Anion gap: 11 (ref 5–15)
BUN: 28 mg/dL — ABNORMAL HIGH (ref 8–23)
CO2: 22 mmol/L (ref 22–32)
Calcium: 9.3 mg/dL (ref 8.9–10.3)
Chloride: 102 mmol/L (ref 98–111)
Creatinine, Ser: 1.59 mg/dL — ABNORMAL HIGH (ref 0.44–1.00)
GFR, Estimated: 31 mL/min — ABNORMAL LOW (ref >60)
Glucose, Bld: 140 mg/dL — ABNORMAL HIGH (ref 70–99)
Potassium: 4.5 mmol/L (ref 3.5–5.1)
Sodium: 135 mmol/L (ref 135–145)

## 2023-07-18 LAB — GLUCOSE, CAPILLARY
Glucose-Capillary: 146 mg/dL — ABNORMAL HIGH (ref 70–99)
Glucose-Capillary: 186 mg/dL — ABNORMAL HIGH (ref 70–99)
Glucose-Capillary: 199 mg/dL — ABNORMAL HIGH (ref 70–99)
Glucose-Capillary: 163 mg/dL — ABNORMAL HIGH (ref 70–99)

## 2023-07-18 LAB — MAGNESIUM: Magnesium: 1.9 mg/dL (ref 1.7–2.4)

## 2023-07-18 MED ORDER — POLYETHYLENE GLYCOL 3350 17 G PO PACK
17.0000 g | PACK | Freq: Every day | ORAL | Status: DC
  Administered 2023-07-18 – 2023-07-19 (×2): 17 g via ORAL
  Filled 2023-07-18 (×2): qty 1

## 2023-07-18 MED ORDER — SENNOSIDES-DOCUSATE SODIUM 8.6-50 MG PO TABS
2.0000 | ORAL_TABLET | Freq: Two times a day (BID) | ORAL | Status: DC
  Administered 2023-07-18 – 2023-07-19 (×2): 2 via ORAL
  Filled 2023-07-18 (×2): qty 2

## 2023-07-18 NOTE — [place] FL2 (Signed)
 [place] MEDICAID FL2 LEVEL OF CARE FORM     IDENTIFICATION  Patient Name: [name] [name] Birthdate: 1935-07-02 Sex: female Admission Date (Current Location): 07/14/2023  [facility] and [place] Number:  Chiropodist and Address:  [facility], [address] [place], [place] [address]      Provider Number: [id number]  Attending Physician Name and Address:  Marrion Coy, MD  Relative Name and Phone Number:  Regis Bill (Daughter)  [phone] (Mobile)    Current Level of Care: Hospital Recommended Level of Care: Skilled Nursing Facility Prior Approval Number:    Date Approved/Denied:   PASRR Number:    Discharge Plan: SNF    Current Diagnoses: Patient Active Problem List   Diagnosis Date Noted   Hypomagnesemia 07/15/2023   NSVT (nonsustained ventricular tachycardia) (HCC) 07/15/2023   Acute on chronic systolic (congestive) heart failure (HCC) 07/14/2023   Bilateral lower extremity edema 04/01/2023   Lightheadedness 04/01/2023   RVF (right ventricular failure) (HCC) 04/01/2023   Acute on chronic systolic CHF (congestive heart failure) (HCC) 03/31/2023   Acute on chronic systolic heart failure (HCC) 03/31/2023   History of CVA (cerebrovascular accident) 03/31/2023   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) 07/06/2020   Troponin level elevated 02/03/2018   Diabetes (HCC) 09/12/2017   Hyperlipidemia 09/12/2017   PAD (peripheral artery disease) (HCC) 06/09/2017   Essential hypertension 06/09/2014   H/O: duodenal ulcer 06/13/2013   Personal history of breast cancer 06/13/2013    Orientation RESPIRATION BLADDER Height & Weight     Self, Time, Situation, Place  Normal Incontinent Weight: 72 kg Height:  5\' 5"  (165.1 cm)  BEHAVIORAL SYMPTOMS/MOOD NEUROLOGICAL BOWEL NUTRITION STATUS  Other (Comment) (n/a)  (n/a) Incontinent Diet (Heart)  AMBULATORY STATUS COMMUNICATION OF NEEDS Skin   Limited Assist Verbally Normal                        Personal Care Assistance Level of Assistance  Bathing, Dressing Bathing Assistance: Limited assistance   Dressing Assistance: Limited assistance     Functional Limitations Info  Sight, Hearing Sight Info: Adequate Hearing Info: Adequate      SPECIAL CARE FACTORS FREQUENCY  PT (By licensed PT), OT (By licensed OT)     PT Frequency: Min 2x weekly OT Frequency: Min 2x weekly            Contractures Contractures Info: Not present    Additional Factors Info  Code Status, Allergies Code Status Info: DNR Allergies Info: Codeine, Etodolac, Indomethacin, Penicillins, Tizanidine, Tramadol           Current Medications (07/18/2023):  This is the current hospital active medication list Current Facility-Administered Medications  Medication Dose Route Frequency Provider Last Rate Last Admin   acetaminophen (TYLENOL) tablet 650 mg  650 mg Oral Q4H PRN Marrion Coy, MD   650 mg at 07/16/23 1140   allopurinol (ZYLOPRIM) tablet 100 mg  100 mg Oral Daily Marrion Coy, MD   100 mg at 07/18/23 0840   aspirin EC tablet 81 mg  81 mg Oral Daily Marrion Coy, MD   81 mg at 07/18/23 0840   carvedilol (COREG) tablet 3.125 mg  3.125 mg Oral BID WC Marrion Coy, MD   3.125 mg at 07/18/23 0840   dapagliflozin propanediol (FARXIGA) tablet 10 mg  10 mg Oral Daily Marrion Coy, MD   10 mg at 07/18/23 0840   enoxaparin (LOVENOX) injection 30 mg  30 mg Subcutaneous Q24H Zhang,  Dekui, MD       insulin aspart (novoLOG) injection 0-6 Units  0-6 Units Subcutaneous TID WC Marrion Coy, MD   1 Units at 07/17/23 1607   memantine (NAMENDA) tablet 20 mg  20 mg Oral QPM Marrion Coy, MD   20 mg at 07/17/23 1609   ondansetron (ZOFRAN) injection 4 mg  4 mg Intravenous Q6H PRN Marrion Coy, MD       Oral care mouth rinse  15 mL Mouth Rinse PRN Marrion Coy, MD       pravastatin (PRAVACHOL) tablet 20 mg  20 mg Oral QHS Marrion Coy, MD   20 mg at 07/17/23 2113   sodium chloride flush (NS) 0.9 %  injection 3 mL  3 mL Intravenous Q12H Marrion Coy, MD   3 mL at 07/18/23 0840   sodium chloride flush (NS) 0.9 % injection 3 mL  3 mL Intravenous PRN Marrion Coy, MD         Discharge Medications: Please see discharge summary for a list of discharge medications.  Relevant Imaging Results:  Relevant Lab Results:   Additional Information SSN [id number]  Truddie Hidden, RN

## 2023-07-18 NOTE — Progress Notes (Signed)
  Progress Note   Patient: [name] [name] WUJ:[id number] DOB: 1935-12-01 DOA: 07/14/2023     4 DOS: the patient was seen and examined on 07/18/2023   Brief hospital course: Marishka Delap is a 88 y.o. female with medical history significant of essential hypertension, history of stroke, type 2 diabetes, chronic systolic congestive heart failure with ejection fraction 25%, dementia, who presents to the hospital with generalized weakness and shortness of breath.  CT scan ruled out PE, showed bilateral pleural effusion more on the right side.  Elevated BNP.  Patient was also with acute on chronic CHF, started on IV Lasix.   Principal Problem:   Acute on chronic systolic (congestive) heart failure (HCC) Active Problems:   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)   Essential hypertension   History of CVA (cerebrovascular accident)   Hypomagnesemia   NSVT (nonsustained ventricular tachycardia) (HCC)   Assessment and Plan: Acute on chronic combined systolic diastolic congestive heart failure. Bilateral pleural effusion secondary to congestive heart failure. Reviewed echocardiogram performed in December 2024, ejection fraction was 20 to 25% with grade 3 diastolic dysfunction. Patient had a second short of breath, chest CT scan had a pleural effusion, evaded BNP. 4/5. Patient was treated with Aldactone IV Lasix, ARB.  Will start beta-Crabtree when condition improves.  Add on Farxiga. 4/6.  Volume status improved, discontinue IV Lasix, added Coreg. Patient condition has improved, volume status much better.  Continue to watch renal function. 4/7.  Continue to hold diuretics as renal function still getting worse.   Chronic kidney disease stage IIIa Mild metabolic acidosis. Hypomagnesemia. Hypokalemia. Continue hold diuretics.  Renal function slightly worse.  Recheck BMP tomorrow   Essential hypertension. ARB on hold due to borderline blood pressure.   Type 2 diabetes. Start sliding scale  insulin   General Weakness. PT/OT now recommending nursing home placement.   Dementia. Follow closely, high risk for delirium.   Nonsustained ventricular tachycardia. Patient had 7 beats of V. tach this afternoon 4/6.  No recurrence.      Subjective:  Patient feels well today, no complaint.  Short of breath much improved.  Physical Exam: Vitals:   07/18/23 0413 07/18/23 0530 07/18/23 0812 07/18/23 1219  BP: 114/77  (!) 130/90 110/83  Pulse: 86  82 76  Resp: 18     Temp: (!) 97.3 F (36.3 C)  (!) 97.5 F (36.4 C) (!) 97.4 F (36.3 C)  TempSrc:      SpO2: 100%  100% 100%  Weight:  72 kg    Height:       General exam: Appears calm and comfortable  Respiratory system: Clear to auscultation. Respiratory effort normal. Cardiovascular system: S1 & S2 heard, RRR. No JVD, murmurs, rubs, gallops or clicks. No pedal edema. Gastrointestinal system: Abdomen is nondistended, soft and nontender. No organomegaly or masses felt. Normal bowel sounds heard. Central nervous system: Alert and oriented. No focal neurological deficits. Extremities: Symmetric 5 x 5 power. Skin: No rashes, lesions or ulcers Psychiatry: Judgement and insight appear normal. Mood & affect appropriate.    Data Reviewed:  Lab results reviewed.  Family Communication: None  Disposition: Status is: Inpatient Remains inpatient appropriate because: Unsafe discharge, pending nursing home placement.     Time spent: 35 minutes  Author: Marrion Coy, MD 07/18/2023 12:51 PM  For on call review [email or web address].

## 2023-07-18 NOTE — Plan of Care (Signed)
 Problem: Education: Goal: Ability to describe self-care measures that may prevent or decrease complications (Diabetes Survival Skills Education) will improve 07/18/2023 0503 by Martha Clan, RN Outcome: Progressing 07/18/2023 0503 by Martha Clan, RN Outcome: Progressing Goal: Individualized Educational Video(s) 07/18/2023 0503 by Martha Clan, RN Outcome: Progressing 07/18/2023 0503 by Martha Clan, RN Outcome: Progressing   Problem: Coping: Goal: Ability to adjust to condition or change in health will improve 07/18/2023 0503 by Martha Clan, RN Outcome: Progressing 07/18/2023 0503 by Martha Clan, RN Outcome: Progressing   Problem: Fluid Volume: Goal: Ability to maintain a balanced intake and output will improve 07/18/2023 0503 by Martha Clan, RN Outcome: Progressing 07/18/2023 0503 by Martha Clan, RN Outcome: Progressing   Problem: Health Behavior/Discharge Planning: Goal: Ability to identify and utilize available resources and services will improve 07/18/2023 0503 by Martha Clan, RN Outcome: Progressing 07/18/2023 0503 by Martha Clan, RN Outcome: Progressing Goal: Ability to manage health-related needs will improve 07/18/2023 0503 by Martha Clan, RN Outcome: Progressing 07/18/2023 0503 by Martha Clan, RN Outcome: Progressing   Problem: Metabolic: Goal: Ability to maintain appropriate glucose levels will improve 07/18/2023 0503 by Martha Clan, RN Outcome: Progressing 07/18/2023 0503 by Martha Clan, RN Outcome: Progressing   Problem: Nutritional: Goal: Maintenance of adequate nutrition will improve 07/18/2023 0503 by Martha Clan, RN Outcome: Progressing 07/18/2023 0503 by Martha Clan, RN Outcome: Progressing Goal: Progress toward achieving an optimal weight will improve 07/18/2023 0503 by Martha Clan, RN Outcome: Progressing 07/18/2023 0503 by Martha Clan, RN Outcome:  Progressing   Problem: Skin Integrity: Goal: Risk for impaired skin integrity will decrease 07/18/2023 0503 by Martha Clan, RN Outcome: Progressing 07/18/2023 0503 by Martha Clan, RN Outcome: Progressing   Problem: Tissue Perfusion: Goal: Adequacy of tissue perfusion will improve 07/18/2023 0503 by Martha Clan, RN Outcome: Progressing 07/18/2023 0503 by Martha Clan, RN Outcome: Progressing   Problem: Education: Goal: Knowledge of General Education information will improve Description: Including pain rating scale, medication(s)/side effects and non-pharmacologic comfort measures 07/18/2023 0503 by Martha Clan, RN Outcome: Progressing 07/18/2023 0503 by Martha Clan, RN Outcome: Progressing   Problem: Health Behavior/Discharge Planning: Goal: Ability to manage health-related needs will improve 07/18/2023 0503 by Martha Clan, RN Outcome: Progressing 07/18/2023 0503 by Martha Clan, RN Outcome: Progressing   Problem: Clinical Measurements: Goal: Ability to maintain clinical measurements within normal limits will improve 07/18/2023 0503 by Martha Clan, RN Outcome: Progressing 07/18/2023 0503 by Martha Clan, RN Outcome: Progressing Goal: Will remain free from infection 07/18/2023 0503 by Martha Clan, RN Outcome: Progressing 07/18/2023 0503 by Martha Clan, RN Outcome: Progressing Goal: Diagnostic test results will improve 07/18/2023 0503 by Martha Clan, RN Outcome: Progressing 07/18/2023 0503 by Martha Clan, RN Outcome: Progressing Goal: Respiratory complications will improve 07/18/2023 0503 by Martha Clan, RN Outcome: Progressing 07/18/2023 0503 by Martha Clan, RN Outcome: Progressing Goal: Cardiovascular complication will be avoided 07/18/2023 0503 by Martha Clan, RN Outcome: Progressing 07/18/2023 0503 by Martha Clan, RN Outcome: Progressing   Problem: Activity: Goal: Risk  for activity intolerance will decrease 07/18/2023 0503 by Martha Clan, RN Outcome: Progressing 07/18/2023 0503 by Martha Clan, RN Outcome: Progressing   Problem: Nutrition: Goal: Adequate nutrition will be maintained 07/18/2023 0503 by Martha Clan, RN Outcome: Progressing 07/18/2023 0503 by Martha Clan, RN Outcome: Progressing  Problem: Coping: Goal: Level of anxiety will decrease 07/18/2023 0503 by Martha Clan, RN Outcome: Progressing 07/18/2023 0503 by Martha Clan, RN Outcome: Progressing   Problem: Elimination: Goal: Will not experience complications related to bowel motility 07/18/2023 0503 by Martha Clan, RN Outcome: Progressing 07/18/2023 0503 by Martha Clan, RN Outcome: Progressing Goal: Will not experience complications related to urinary retention 07/18/2023 0503 by Martha Clan, RN Outcome: Progressing 07/18/2023 0503 by Martha Clan, RN Outcome: Progressing   Problem: Pain Managment: Goal: General experience of comfort will improve and/or be controlled 07/18/2023 0503 by Martha Clan, RN Outcome: Progressing 07/18/2023 0503 by Martha Clan, RN Outcome: Progressing   Problem: Safety: Goal: Ability to remain free from injury will improve 07/18/2023 0503 by Martha Clan, RN Outcome: Progressing 07/18/2023 0503 by Martha Clan, RN Outcome: Progressing   Problem: Skin Integrity: Goal: Risk for impaired skin integrity will decrease 07/18/2023 0503 by Martha Clan, RN Outcome: Progressing 07/18/2023 0503 by Martha Clan, RN Outcome: Progressing

## 2023-07-18 NOTE — Progress Notes (Signed)
 Patient refused her Lovenox shot. She stated that she had not gotten one here during this admission. . She does not want it.

## 2023-07-18 NOTE — TOC Progression Note (Signed)
 Transition of Care [facility]) - Progression Note    Patient Details  Name: [name] [name] MRN: [id number] Date of Birth: November 17, 1935  [facility]) CM/SW Contact  Truddie Hidden, RN Phone Number: 07/18/2023, 9:08 AM  Clinical Narrative:    Spoke with patient's daughter, Inocencio Homes regarding therapy's recommendation for SNF. She was advised once bed is obtained, bed is available and patient is medically stable she can discharge. Inocencio Homes was advised no [facility] is required for patient's plan. RNCM provided choices for SNF in [facility] and [facility] and [address]. Inocencio Homes chose [facility] as first choice, [facility] as second choice and [facility] as third choice.   FL2 completed.   Bed search started.     Expected Discharge Plan: Home w Home Health Services Barriers to Discharge: Continued Medical Work up  Expected Discharge Plan and Services     Post Acute Care Choice: Durable Medical Equipment Living arrangements for the past 2 months: Independent Living Facility                                       Social Determinants of Health (SDOH) Interventions SDOH Screenings   Food Insecurity: No Food Insecurity (07/14/2023)  Housing: Low Risk  (07/15/2023)  Transportation Needs: No Transportation Needs (07/14/2023)  Utilities: Not At Risk (07/14/2023)  Financial Resource Strain: Low Risk  (02/21/2023)   Received from [facility]  Social Connections: Unknown (07/15/2023)  Tobacco Use: Medium Risk (07/15/2023)    Readmission Risk Interventions     No data to display

## 2023-07-18 NOTE — Progress Notes (Signed)
 Occupational Therapy Treatment Patient Details Name: [name] [name] MRN: [id number] DOB: 06-May-1935 Today's Date: 07/18/2023   History of present illness Pt is an 88 year old female presenting with generalized weakness and SOB, admitted with Acute on chronic combined systolic diastolic congestive heart failure, Lateral pleural effusion secondary to congestive heart failure.      PMH significant for ssential hypertension, history of stroke, type 2 diabetes, chronic systolic congestive heart failure with ejection fraction 25%, dementia   OT comments  Pt seen for OT treatment on this date. Upon arrival to room pt seated on EOB finishing her breakfast, agreeable to tx. Pt daughter at bedside at start of session, she stepped out for the duration of session. Pt amb to BR and within room total ~59ft with CGA/progressed to supervision, no LOB noted. Pt completed toileting and clothing management with supervision assistance only. VSS throughout session. Pt retired in bed with all needs in reach. Pt endorses more energy today and feeling more like herself. Pt making good progress toward goals, will continue to follow POC. Discharge recommendation remains appropriate.        If plan is discharge home, recommend the following:  A little help with bathing/dressing/bathroom;Help with stairs or ramp for entrance;Assistance with cooking/housework   Equipment Recommendations  None recommended by OT    Recommendations for Other Services      Precautions / Restrictions Precautions Precautions: Fall Recall of Precautions/Restrictions: Intact Restrictions Weight Bearing Restrictions Per Provider Order: No       Mobility Bed Mobility Overal bed mobility: Needs Assistance Bed Mobility: Sit to Supine       Sit to supine: Supervision, HOB elevated   General bed mobility comments: Pt seated on EOB with feet supported on arrival to room    Transfers Overall transfer level: Needs assistance Equipment  used: Rolling walker (2 wheels) Transfers: Sit to/from Stand Sit to Stand: Contact guard assist           General transfer comment: Pt amb to BR and within room total ~53ft with CGA/progressed to supervision, no LOB noted. Pt endorses more energy today and feeling more like herself.     Balance Overall balance assessment: Needs assistance Sitting-balance support: No upper extremity supported, Feet supported Sitting balance-Leahy Scale: Good Sitting balance - Comments: steady reaching within BOS   Standing balance support: Single extremity supported, During functional activity, Reliant on assistive device for balance Standing balance-Leahy Scale: Fair Standing balance comment: steady ambulating with RW use                           ADL either performed or assessed with clinical judgement   ADL Overall ADL's : Needs assistance/impaired Eating/Feeding: Set up;Sitting   Grooming: Wash/dry hands;Standing (Sink level)                   Toilet Transfer: Supervision/safety;Contact guard assist;Rolling walker (2 wheels);Regular Toilet;Grab bars   Toileting- Clothing Manipulation and Hygiene: Supervision/safety;Sitting/lateral lean       Functional mobility during ADLs: Contact guard assist;Rolling walker (2 wheels) General ADL Comments: Pt finishing breakfast indep while seated on EOB on arrival to room. Pt amb to BR with RW + CGA progressed to supervision.    Extremity/Trunk Assessment              Vision       Perception     Praxis     Communication Communication Communication: No apparent difficulties   Cognition  Arousal: Alert Behavior During Therapy: WFL for tasks assessed/performed Cognition: No apparent impairments             OT - Cognition Comments: A/Ox4                 Following commands: Intact        Cueing   Cueing Techniques: Verbal cues  Exercises Exercises: Other exercises Other Exercises Other Exercises: Edu:  benefits of rehab, safe ADL completion on d/c    Shoulder Instructions       General Comments Daughter present on arrival to room, she left room during session.    Pertinent Vitals/ Pain       Pain Assessment Pain Assessment: No/denies pain  Home Living                                          Prior Functioning/Environment              Frequency  Min 2X/week        Progress Toward Goals  OT Goals(current goals can now be found in the care plan section)  Progress towards OT goals: Progressing toward goals  Acute Rehab OT Goals Patient Stated Goal: improve function OT Goal Formulation: With patient Time For Goal Achievement: 07/29/23 Potential to Achieve Goals: Good ADL Goals Pt Will Perform Grooming: with modified independence;sitting;standing Pt Will Perform Lower Body Dressing: with modified independence;sit to/from stand;sitting/lateral leans Pt Will Transfer to Toilet: with modified independence;ambulating Pt Will Perform Toileting - Clothing Manipulation and hygiene: with modified independence;sit to/from stand;sitting/lateral leans  Plan      Co-evaluation                 AM-PAC OT "6 Clicks" Daily Activity     Outcome Measure   Help from another person eating meals?: None Help from another person taking care of personal grooming?: None Help from another person toileting, which includes using toliet, bedpan, or urinal?: None Help from another person bathing (including washing, rinsing, drying)?: A Little Help from another person to put on and taking off regular upper body clothing?: None Help from another person to put on and taking off regular lower body clothing?: A Little 6 Click Score: 22    End of Session Equipment Utilized During Treatment: Rolling walker (2 wheels)  OT Visit Diagnosis: Other abnormalities of gait and mobility (R26.89);Muscle weakness (generalized) (M62.81)   Activity Tolerance Patient tolerated  treatment well   Patient Left in bed;with call bell/phone within reach;with bed alarm set   Nurse Communication Mobility status        Time: 1610-9604 OT Time Calculation (min): 13 min  Charges: OT General Charges $OT Visit: 1 Visit OT Treatments $Self Care/Home Management : 8-22 mins  Glenard Haring M.S. OTR/L  07/18/23, 10:32 AM

## 2023-07-19 DIAGNOSIS — I5023 Acute on chronic systolic (congestive) heart failure: Secondary | ICD-10-CM | POA: Diagnosis not present

## 2023-07-19 LAB — BASIC METABOLIC PANEL WITH GFR
Anion gap: 10 (ref 5–15)
BUN: 29 mg/dL — ABNORMAL HIGH (ref 8–23)
CO2: 20 mmol/L — ABNORMAL LOW (ref 22–32)
Calcium: 9.1 mg/dL (ref 8.9–10.3)
Chloride: 105 mmol/L (ref 98–111)
Creatinine, Ser: 1.4 mg/dL — ABNORMAL HIGH (ref 0.44–1.00)
GFR, Estimated: 37 mL/min — ABNORMAL LOW (ref >60)
Glucose, Bld: 137 mg/dL — ABNORMAL HIGH (ref 70–99)
Potassium: 3.9 mmol/L (ref 3.5–5.1)
Sodium: 135 mmol/L (ref 135–145)

## 2023-07-19 LAB — GLUCOSE, CAPILLARY
Glucose-Capillary: 144 mg/dL — ABNORMAL HIGH (ref 70–99)
Glucose-Capillary: 188 mg/dL — ABNORMAL HIGH (ref 70–99)

## 2023-07-19 MED ORDER — CARVEDILOL 3.125 MG PO TABS: 3.1250 mg | ORAL_TABLET | Freq: Two times a day (BID) | ORAL | Status: DC

## 2023-07-19 MED ORDER — DAPAGLIFLOZIN PROPANEDIOL 10 MG PO TABS
10.0000 mg | ORAL_TABLET | Freq: Every day | ORAL | Status: DC
Start: 2023-07-20 — End: 2024-03-02

## 2023-07-19 NOTE — TOC Transition Note (Addendum)
 Transition of Care [facility]) - Discharge Note   Patient Details  Name: [name] [name] MRN: [id number] Date of Birth: Apr 28, 1935  [facility] CM/SW Contact:  Truddie Hidden, RN Phone Number: 07/19/2023, 1:40 PM   Clinical Narrative:    Spoke with Gena in admissions Per facility patient admission confirmed for today. Patient assigned room # 714 @ [facility] Report will be called to [phone] Discharge summary sent in HUB.  Nurse, and family notified spoke with her daughter, Inocencio Homes. She will transport patient to facility.   Gena from [facility] updated with [place] PASRR [phone] A TOC signing off.      Barriers to Discharge: Continued Medical Work up   Patient Goals and CMS Choice            Discharge Placement                       Discharge Plan and Services Additional resources added to the After Visit Summary for       Post Acute Care Choice: Durable Medical Equipment                               Social Drivers of Health (SDOH) Interventions SDOH Screenings   Food Insecurity: No Food Insecurity (07/14/2023)  Housing: Low Risk  (07/15/2023)  Transportation Needs: No Transportation Needs (07/14/2023)  Utilities: Not At Risk (07/14/2023)  Financial Resource Strain: Low Risk  (02/21/2023)   [facility] from [facility]  Social Connections: Unknown (07/15/2023)  Tobacco Use: Medium Risk (07/15/2023)     Readmission Risk Interventions     No data to display

## 2023-07-19 NOTE — Progress Notes (Signed)
 Plan of care is reviewed. Pt has been progressing. She is alert and fully oriented x 4, afebrile, stable hemodynamically, NSR on the monitor, on room air, no acute distress noted overnight.  She is able to sleep well. No major complaints. We will continue to monitor.   Filiberto Pinks, RN

## 2023-07-19 NOTE — TOC Progression Note (Signed)
 Transition of Care [facility]) - Progression Note    Patient Details  Name: [name] [name] MRN: [id number] Date of Birth: [birth year]  [facility]) CM/SW Contact  Truddie Hidden, RN Phone Number: 07/19/2023, 10:56 AM  Clinical Narrative:    Spoke with patient's daughter, [name] [name] to give bed offers for [name] [name], [facility], and [facility]. She was advised [facility] is not needed.   Spoke with [name] [name] from [facility].  Patient can be accepted today.  Expected Discharge Plan: Home w Home Health Services Barriers to Discharge: Continued Medical Work up  Expected Discharge Plan and Services     Post Acute Care Choice: Durable Medical Equipment Living arrangements for the past 2 months: Independent Living Facility                                       Social Determinants of Health (SDOH) Interventions SDOH Screenings   Food Insecurity: No Food Insecurity (07/14/2023)  Housing: Low Risk  (07/15/2023)  Transportation Needs: No Transportation Needs (07/14/2023)  Utilities: Not At Risk (07/14/2023)  Financial Resource Strain: Low Risk  (02/21/2023)   Received from [facility]  Social Connections: Unknown (07/15/2023)  Tobacco Use: Medium Risk (07/15/2023)    Readmission Risk Interventions     No data to display

## 2023-07-19 NOTE — Plan of Care (Signed)

## 2023-07-19 NOTE — Discharge Summary (Signed)
 Physician Discharge Summary   Patient: [name] [name] MRN: [id number] DOB: Feb 17, 1936  Admit date:     07/14/2023  Discharge date: 07/19/23  Discharge Physician: Loyce Dys   PCP: Lynnea Ferrier, MD   Recommendations at discharge:    Discharge Diagnoses: Acute on chronic combined systolic diastolic congestive heart failure. Bilateral pleural effusion secondary to congestive heart failure. Reviewed echocardiogram performed in December 2024, ejection fraction was 20 to 25% with grade 3 diastolic dysfunction. Patient had a second short of breath, chest CT scan had a pleural effusion, evaded BNP. 4/5. Patient was treated with Aldactone IV Lasix, ARB.  Will start beta-Kemple when condition improves.  Add on Farxiga. 4/6.  Volume status improved, discontinue IV Lasix, added Coreg. Patient condition has improved, volume status much better.  Continue to watch renal function. 4/7.  Continue to hold diuretics as renal function still getting worse.   Chronic kidney disease stage IIIa Mild metabolic acidosis. Hypomagnesemia. Hypokalemia. Essential hypertension. Type 2 diabetes. General Weakness. Dementia. Nonsustained ventricular tachycardia.    Hospital Course: [name] [name] is a 88 y.o. female with medical history significant of essential hypertension, history of stroke, type 2 diabetes, chronic systolic congestive heart failure with ejection fraction 25%, dementia, who presents to the hospital with generalized weakness and shortness of breath.  CT scan ruled out PE, showed bilateral pleural effusion more on the right side.  Elevated BNP.  Patient was subsequently admitted for management of acute on chronic congestive heart failure requiring IV Lasix.  Patient's renal function mildly worsened and subsequently Lasix was withheld.also patient's blood pressure was found to be on the lower side and so losartan as well as spironolactone currently on hold.  We can reintroduce Lasix, losartan  as well as spironolactone if blood pressure and renal function allows.  Consultants: None Procedures performed: None Disposition: Skilled nursing facility Diet recommendation:  Cardiac diet DISCHARGE MEDICATION: Allergies as of 07/19/2023       Reactions   Codeine Other (See Comments)   "I go crazy"   Etodolac    Other reaction(s): Unknown   Indomethacin    Other reaction(s): Unknown   Penicillins Other (See Comments)   Does not remember this allergy Has patient had a PCN reaction causing immediate rash, facial/tongue/throat swelling, SOB or lightheadedness with hypotension: Unknown Has patient had a PCN reaction causing severe rash involving mucus membranes or skin necrosis: Unknown Has patient had a PCN reaction that required hospitalization: Unknown Has patient had a PCN reaction occurring within the last 10 years: Unknown If all of the above answers are "NO", then may proceed with Cephalosporin use.   Tizanidine    Other reaction(s): Syncope Fell on the floor    Tramadol    Other reaction(s): Unknown        Medication List     STOP taking these medications    colchicine 0.6 MG tablet   furosemide 20 MG tablet Commonly known as: Lasix   losartan 25 MG tablet Commonly known as: COZAAR   naproxen sodium 220 MG tablet Commonly known as: ALEVE   spironolactone 25 MG tablet Commonly known as: ALDACTONE       TAKE these medications    allopurinol 100 MG tablet Commonly known as: ZYLOPRIM Take 1 tablet by mouth daily.   aspirin EC 81 MG tablet Take 1 tablet (81 mg total) by mouth daily. Swallow whole.   busPIRone 7.5 MG tablet Commonly known as: BUSPAR Take 7.5 mg by mouth 2 (two) times daily.  carvedilol 3.125 MG tablet Commonly known as: COREG Take 1 tablet (3.125 mg total) by mouth 2 (two) times daily with a meal.   dapagliflozin propanediol 10 MG Tabs tablet Commonly known as: FARXIGA Take 1 tablet (10 mg total) by mouth daily. Start taking  on: July 20, 2023   Loperamide A-D 2 MG tablet Generic drug: loperamide Take 2 mg by mouth as needed for diarrhea or loose stools.   memantine 10 MG tablet Commonly known as: NAMENDA Take 2 tablets by mouth every evening.   metFORMIN 1000 MG tablet Commonly known as: GLUCOPHAGE Take 1 tablet by mouth 2 (two) times daily. Take 1 tablet in the morning and 1 tablet in the evening   polyethylene glycol 17 g packet Commonly known as: MIRALAX / GLYCOLAX Take 17 g by mouth daily as needed (Constipation).   pravastatin 20 MG tablet Commonly known as: PRAVACHOL Take 1 tablet by mouth every evening.   promethazine 12.5 MG tablet Commonly known as: PHENERGAN Take 12.5 mg by mouth every 4 (four) hours as needed.   sodium chloride 0.65 % Soln nasal spray Commonly known as: OCEAN Place 1 spray into both nostrils as needed for congestion.        Discharge Exam: Filed Weights   07/18/23 0530 07/18/23 2133 07/19/23 0254  Weight: 72 kg 72.6 kg 72.5 kg   General exam: Appears calm and comfortable  Respiratory system: Clear to auscultation. Respiratory effort normal. Cardiovascular system: S1 & S2 heard, RRR. No JVD, murmurs, rubs, gallops or clicks. No pedal edema. Gastrointestinal system: Abdomen is nondistended, soft and nontender. No organomegaly or masses felt. Normal bowel sounds heard. Central nervous system: Alert and oriented. No focal neurological deficits. Extremities: Symmetric 5 x 5 power. Skin: No rashes, lesions or ulcers Psychiatry: Judgement and insight appear normal. Mood & affect appropriate.   Condition at discharge: good  The results of significant diagnostics from this hospitalization (including imaging, microbiology, ancillary and laboratory) are listed below for reference.   Imaging Studies: CT Angio Chest Pulmonary Embolism (PE) W or WO Contrast Result Date: 07/14/2023 CLINICAL DATA:  Concern for pulmonary embolism. EXAM: CT ANGIOGRAPHY CHEST WITH CONTRAST  TECHNIQUE: Multidetector CT imaging of the chest was performed using the standard protocol during bolus administration of intravenous contrast. Multiplanar CT image reconstructions and MIPs were obtained to evaluate the vascular anatomy. RADIATION DOSE REDUCTION: This exam was performed according to the departmental dose-optimization program which includes automated exposure control, adjustment of the mA and/or kV according to patient size and/or use of iterative reconstruction technique. CONTRAST:  75mL OMNIPAQUE IOHEXOL 350 MG/ML SOLN COMPARISON:  Chest CT dated 03/30/2023. Radiograph dated 07/09/2023. FINDINGS: Evaluation of this exam is limited due to respiratory motion. Cardiovascular: Mild cardiomegaly. No pericardial effusion. There is coronary vascular calcification. Retrograde flow of contrast from the right atrium into the IVC suggestive of right heart dysfunction. Correlation with echocardiogram recommended. There is moderate atherosclerotic calcification of the thoracic aorta. No aneurysmal dilatation. Evaluation of the pulmonary arteries is limited due to respiratory motion. No pulmonary artery embolus identified. Mediastinum/Nodes: No hilar or mediastinal adenopathy. The esophagus is grossly unremarkable. No mediastinal fluid collection. Lungs/Pleura: Moderate right and small left pleural effusions with partial compressive atelectasis of the lower lobes. Pneumonia is not excluded. Clinical correlation is recommended. Bilateral interstitial densities may represent atelectasis or edema. Pneumonia is less likely. There is no pneumothorax. The central airways are patent. Upper Abdomen: Ascites. Musculoskeletal: Osteopenia with degenerative changes. No acute osseous pathology. Review of the MIP  images confirms the above findings. IMPRESSION: 1. No CT evidence of pulmonary artery embolus. 2. Moderate right and small left pleural effusions with partial compressive atelectasis of the lower lobes. Pneumonia is  not excluded. 3. Mild cardiomegaly with findings suggestive of right heart dysfunction. Correlation with echocardiogram recommended. Electronically Signed   By: Elgie Collard M.D.   On: 07/14/2023 17:03   CT Head Wo Contrast Result Date: 07/14/2023 CLINICAL DATA:  Neuro deficit, acute, stroke suspected. Syncope/presyncope, cerebrovascular cause suspected. Weakness and dizziness. EXAM: CT HEAD WITHOUT CONTRAST TECHNIQUE: Contiguous axial images were obtained from the base of the skull through the vertex without intravenous contrast. RADIATION DOSE REDUCTION: This exam was performed according to the departmental dose-optimization program which includes automated exposure control, adjustment of the mA and/or kV according to patient size and/or use of iterative reconstruction technique. COMPARISON:  Head CT and MRI 03/05/2014 FINDINGS: Brain: There is no evidence of an acute infarct, intracranial hemorrhage, mass, midline shift, or extra-axial fluid collection. Mild cerebral atrophy has mildly progressed. Patchy cerebral white matter hypodensities have progressed and are nonspecific but compatible with mild chronic small vessel ischemic disease. Vascular: Calcified atherosclerosis at the skull base. No hyperdense vessel. Skull: No acute fracture or suspicious lesion. Sinuses/Orbits: Visualized paranasal sinuses and mastoid air cells are clear. Unremarkable included orbits. Other: None. IMPRESSION: 1. No evidence of acute intracranial abnormality. 2. Mild chronic small vessel ischemic disease. Electronically Signed   By: Sebastian Ache M.D.   On: 07/14/2023 14:57   DG Foot 2 Views Left Result Date: 07/14/2023 CLINICAL DATA:  L foot pain midfoot. EXAM: LEFT FOOT - 2 VIEW COMPARISON:  12/17/2017. FINDINGS: There is diffuse osteopenia of the visualized osseous structures. No acute fracture or dislocation. No aggressive osseous lesion. Mild diffuse degenerative changes of imaged joints. Calcaneal spur noted along the  Achilles tendon and Plantar aponeurosis attachment sites. No focal soft tissue swelling. No radiopaque foreign bodies. IMPRESSION: No acute osseous abnormality of the left foot. Electronically Signed   By: Jules Schick M.D.   On: 07/14/2023 14:04   DG Chest 2 View Result Date: 07/09/2023 CLINICAL DATA:  Short of breath, difficulty breathing, sinus tachycardia EXAM: CHEST - 2 VIEW COMPARISON:  06/30/2023 FINDINGS: Frontal and lateral views of the chest demonstrate a stable cardiac silhouette. No acute airspace disease, effusion, or pneumothorax. No acute bony abnormalities. IMPRESSION: 1. No acute intrathoracic process. Electronically Signed   By: Sharlet Salina M.D.   On: 07/09/2023 14:38   DG Chest Portable 1 View Result Date: 06/30/2023 CLINICAL DATA:  Weakness. EXAM: PORTABLE CHEST 1 VIEW COMPARISON:  03/30/2023. FINDINGS: Low lung volume. There are increased interstitial markings, likely accentuated by low lung volume. No frank pulmonary edema. There is apparent opacity in the right paracardiac region, which is likely secondary to patient rotation. Bilateral lung fields are otherwise clear. Bilateral lateral costophrenic angles are clear. Stable cardio-mediastinal silhouette. No acute osseous abnormalities. The soft tissues are within normal limits. IMPRESSION: *No active disease.  Please see above for details. Electronically Signed   By: Jules Schick M.D.   On: 06/30/2023 09:31    Microbiology: Results for orders placed or performed during the hospital encounter of 07/09/23  Resp panel by RT-PCR (RSV, Flu A&B, Covid) Anterior Nasal Swab     Status: None   Collection Time: 07/09/23  4:35 PM   Specimen: Anterior Nasal Swab  Result Value Ref Range Status   SARS Coronavirus 2 by RT PCR NEGATIVE NEGATIVE Final    Comment: (  NOTE) SARS-CoV-2 target nucleic acids are NOT DETECTED.  The SARS-CoV-2 RNA is generally detectable in upper respiratory specimens during the acute phase of infection. The  lowest concentration of SARS-CoV-2 viral copies this assay can detect is 138 copies/mL. A negative result does not preclude SARS-Cov-2 infection and should not be used as the sole basis for treatment or other patient management decisions. A negative result may occur with  improper specimen collection/handling, submission of specimen other than nasopharyngeal swab, presence of viral mutation(s) within the areas targeted by this assay, and inadequate number of viral copies(<138 copies/mL). A negative result must be combined with clinical observations, patient history, and epidemiological information. The expected result is Negative.  Fact Sheet for Patients:  BloggerCourse.com  Fact Sheet for Healthcare Providers:  SeriousBroker.it  This test is no t yet approved or cleared by the [place] FDA and  has been authorized for detection and/or diagnosis of SARS-CoV-2 by FDA under an Emergency Use Authorization (EUA). This EUA will remain  in effect (meaning this test can be used) for the duration of the COVID-19 declaration under Section 564(b)(1) of the Act, 21 U.S.C.section 360bbb-3(b)(1), unless the authorization is terminated  or revoked sooner.       Influenza A by PCR NEGATIVE NEGATIVE Final   Influenza B by PCR NEGATIVE NEGATIVE Final    Comment: (NOTE) The Xpert Xpress SARS-CoV-2/FLU/RSV plus assay is intended as an aid in the diagnosis of influenza from Nasopharyngeal swab specimens and should not be used as a sole basis for treatment. Nasal washings and aspirates are unacceptable for Xpert Xpress SARS-CoV-2/FLU/RSV testing.  Fact Sheet for Patients: BloggerCourse.com  Fact Sheet for Healthcare Providers: SeriousBroker.it  This test is not yet approved or cleared by the [place] FDA and has been authorized for detection and/or diagnosis of SARS-CoV-2 by FDA under  an Emergency Use Authorization (EUA). This EUA will remain in effect (meaning this test can be used) for the duration of the COVID-19 declaration under Section 564(b)(1) of the Act, 21 U.S.C. section 360bbb-3(b)(1), unless the authorization is terminated or revoked.     Resp Syncytial Virus by PCR NEGATIVE NEGATIVE Final    Comment: (NOTE) Fact Sheet for Patients: BloggerCourse.com  Fact Sheet for Healthcare Providers: SeriousBroker.it  This test is not yet approved or cleared by the [place] FDA and has been authorized for detection and/or diagnosis of SARS-CoV-2 by FDA under an Emergency Use Authorization (EUA). This EUA will remain in effect (meaning this test can be used) for the duration of the COVID-19 declaration under Section 564(b)(1) of the Act, 21 U.S.C. section 360bbb-3(b)(1), unless the authorization is terminated or revoked.  Performed at [facility], [address] [place], [place] [address]     Labs: CBC: Recent Labs  Lab 07/14/23 1017  WBC 5.8  HGB 13.1  HCT 39.0  MCV 92.2  PLT 284   Basic Metabolic Panel: Recent Labs  Lab 07/15/23 0446 07/16/23 0456 07/17/23 0612 07/18/23 0522 07/19/23 0444  NA 135 136 138 135 135  K 4.4 3.6 3.4* 4.5 3.9  CL 103 103 103 102 105  CO2 20* 21* 22 22 20*  GLUCOSE 102* 100* 117* 140* 137*  BUN 20 22 25* 28* 29*  CREATININE 1.26* 1.41* 1.45* 1.59* 1.40*  CALCIUM 9.3 9.1 9.1 9.3 9.1  MG 1.6* 1.9 1.9 1.9  --    Liver Function Tests: No results for input(s): "AST", "ALT", "ALKPHOS", "BILITOT", "PROT", "ALBUMIN" in the last 168 hours. CBG: Recent Labs  Lab 07/18/23 1214 07/18/23 1806 07/18/23 2045 07/19/23 0801 07/19/23 1241  GLUCAP 186* 199* 163* 144* 188*    Discharge time spent:  35 minutes.  Signed: Loyce Dys, MD [facility] 07/19/2023

## 2023-07-26 NOTE — Progress Notes (Signed)
 Given weakness covid results pending.

## 2023-08-10 ENCOUNTER — Encounter: Payer: Self-pay | Admitting: Intensive Care

## 2023-08-10 ENCOUNTER — Emergency Department

## 2023-08-10 ENCOUNTER — Emergency Department
Admission: EM | Admit: 2023-08-10 | Discharge: 2023-08-10 | Discharge status: Against Medical Advice | Attending: Emergency Medicine | Admitting: Emergency Medicine

## 2023-08-10 ENCOUNTER — Other Ambulatory Visit: Payer: Self-pay

## 2023-08-10 DIAGNOSIS — Z5321 Procedure and treatment not carried out due to patient leaving prior to being seen by health care provider: Secondary | ICD-10-CM | POA: Diagnosis not present

## 2023-08-10 DIAGNOSIS — S0990XA Unspecified injury of head, initial encounter: Secondary | ICD-10-CM | POA: Insufficient documentation

## 2023-08-10 DIAGNOSIS — R531 Weakness: Secondary | ICD-10-CM | POA: Diagnosis not present

## 2023-08-10 DIAGNOSIS — W06XXXA Fall from bed, initial encounter: Secondary | ICD-10-CM | POA: Insufficient documentation

## 2023-08-10 DIAGNOSIS — Y92003 Bedroom of unspecified non-institutional (private) residence as the place of occurrence of the external cause: Secondary | ICD-10-CM | POA: Diagnosis not present

## 2023-08-10 DIAGNOSIS — Z853 Personal history of malignant neoplasm of breast: Secondary | ICD-10-CM | POA: Diagnosis not present

## 2023-08-10 LAB — COMPREHENSIVE METABOLIC PANEL WITH GFR
ALT: 18 U/L (ref 0–44)
AST: 27 U/L (ref 15–41)
Albumin: 3.5 g/dL (ref 3.5–5.0)
Alkaline Phosphatase: 87 U/L (ref 38–126)
Anion gap: 4 — ABNORMAL LOW (ref 5–15)
BUN: 19 mg/dL (ref 8–23)
CO2: 19 mmol/L — ABNORMAL LOW (ref 22–32)
Calcium: 8.8 mg/dL — ABNORMAL LOW (ref 8.9–10.3)
Chloride: 115 mmol/L — ABNORMAL HIGH (ref 98–111)
Creatinine, Ser: 1 mg/dL (ref 0.44–1.00)
GFR, Estimated: 55 mL/min — ABNORMAL LOW (ref >60)
Glucose, Bld: 135 mg/dL — ABNORMAL HIGH (ref 70–99)
Potassium: 3.9 mmol/L (ref 3.5–5.1)
Sodium: 138 mmol/L (ref 135–145)
Total Bilirubin: 1.3 mg/dL — ABNORMAL HIGH (ref 0.0–1.2)
Total Protein: 7.5 g/dL (ref 6.5–8.1)

## 2023-08-10 LAB — CBC
HCT: 40.9 % (ref 36.0–46.0)
Hemoglobin: 13.3 g/dL (ref 12.0–15.0)
MCH: 30.6 pg (ref 26.0–34.0)
MCHC: 32.5 g/dL (ref 30.0–36.0)
MCV: 94.2 fL (ref 80.0–100.0)
Platelets: 295 10*3/uL (ref 150–400)
RBC: 4.34 MIL/uL (ref 3.87–5.11)
RDW: 16.5 % — ABNORMAL HIGH (ref 11.5–15.5)
WBC: 7.7 10*3/uL (ref 4.0–10.5)
nRBC: 0 % (ref 0.0–0.2)

## 2023-08-10 NOTE — ED Triage Notes (Signed)
 Patient arrived by EMS after fall. Reports she was getting up from bed to use walker and fell to ground. Reports she cannot ambulate and weak and that's why she fell.  Reports hitting back of head on walker when falling.  Baseline ambulates with walker. Reports ongoing weakness  Denies pain  History right sided breast cancer

## 2023-08-10 NOTE — ED Notes (Signed)
 First Nurse Note: Pt to ED via [facility] from home. Pt had fall this morning and hit her head. Pt is not on blood thinners. Pt is reporting hot flashes. Pt is c/o dizziness. Pt is A & O at her baseline.

## 2023-08-21 ENCOUNTER — Other Ambulatory Visit: Payer: Self-pay

## 2023-08-21 ENCOUNTER — Emergency Department

## 2023-08-21 ENCOUNTER — Inpatient Hospital Stay
Admission: EM | Admit: 2023-08-21 | Discharge: 2023-08-31 | DRG: 281 | Disposition: A | Discharge status: Skilled Nursing Facility | Attending: Student in an Organized Health Care Education/Training Program | Admitting: Student in an Organized Health Care Education/Training Program

## 2023-08-21 DIAGNOSIS — T501X6A Underdosing of loop [high-ceiling] diuretics, initial encounter: Secondary | ICD-10-CM | POA: Diagnosis present

## 2023-08-21 DIAGNOSIS — Z880 Allergy status to penicillin: Secondary | ICD-10-CM

## 2023-08-21 DIAGNOSIS — I10 Essential (primary) hypertension: Secondary | ICD-10-CM | POA: Diagnosis present

## 2023-08-21 DIAGNOSIS — Z66 Do not resuscitate: Secondary | ICD-10-CM | POA: Diagnosis present

## 2023-08-21 DIAGNOSIS — Z604 Social exclusion and rejection: Secondary | ICD-10-CM | POA: Diagnosis present

## 2023-08-21 DIAGNOSIS — E1151 Type 2 diabetes mellitus with diabetic peripheral angiopathy without gangrene: Secondary | ICD-10-CM | POA: Diagnosis present

## 2023-08-21 DIAGNOSIS — I272 Pulmonary hypertension, unspecified: Secondary | ICD-10-CM | POA: Diagnosis present

## 2023-08-21 DIAGNOSIS — N1831 Chronic kidney disease, stage 3a: Secondary | ICD-10-CM | POA: Diagnosis present

## 2023-08-21 DIAGNOSIS — I255 Ischemic cardiomyopathy: Secondary | ICD-10-CM | POA: Diagnosis present

## 2023-08-21 DIAGNOSIS — I739 Peripheral vascular disease, unspecified: Secondary | ICD-10-CM | POA: Diagnosis present

## 2023-08-21 DIAGNOSIS — I361 Nonrheumatic tricuspid (valve) insufficiency: Secondary | ICD-10-CM | POA: Diagnosis present

## 2023-08-21 DIAGNOSIS — Z8673 Personal history of transient ischemic attack (TIA), and cerebral infarction without residual deficits: Secondary | ICD-10-CM

## 2023-08-21 DIAGNOSIS — J189 Pneumonia, unspecified organism: Secondary | ICD-10-CM

## 2023-08-21 DIAGNOSIS — I5020 Unspecified systolic (congestive) heart failure: Secondary | ICD-10-CM | POA: Diagnosis not present

## 2023-08-21 DIAGNOSIS — Z7984 Long term (current) use of oral hypoglycemic drugs: Secondary | ICD-10-CM | POA: Diagnosis not present

## 2023-08-21 DIAGNOSIS — E785 Hyperlipidemia, unspecified: Secondary | ICD-10-CM | POA: Diagnosis present

## 2023-08-21 DIAGNOSIS — Z885 Allergy status to narcotic agent status: Secondary | ICD-10-CM

## 2023-08-21 DIAGNOSIS — Z881 Allergy status to other antibiotic agents status: Secondary | ICD-10-CM

## 2023-08-21 DIAGNOSIS — R531 Weakness: Secondary | ICD-10-CM

## 2023-08-21 DIAGNOSIS — E119 Type 2 diabetes mellitus without complications: Secondary | ICD-10-CM | POA: Diagnosis not present

## 2023-08-21 DIAGNOSIS — I2510 Atherosclerotic heart disease of native coronary artery without angina pectoris: Secondary | ICD-10-CM | POA: Diagnosis present

## 2023-08-21 DIAGNOSIS — F0390 Unspecified dementia without behavioral disturbance: Secondary | ICD-10-CM | POA: Diagnosis present

## 2023-08-21 DIAGNOSIS — Z7982 Long term (current) use of aspirin: Secondary | ICD-10-CM | POA: Diagnosis not present

## 2023-08-21 DIAGNOSIS — Z79899 Other long term (current) drug therapy: Secondary | ICD-10-CM

## 2023-08-21 DIAGNOSIS — Z853 Personal history of malignant neoplasm of breast: Secondary | ICD-10-CM | POA: Diagnosis not present

## 2023-08-21 DIAGNOSIS — Z87891 Personal history of nicotine dependence: Secondary | ICD-10-CM | POA: Diagnosis not present

## 2023-08-21 DIAGNOSIS — I130 Hypertensive heart and chronic kidney disease with heart failure and stage 1 through stage 4 chronic kidney disease, or unspecified chronic kidney disease: Secondary | ICD-10-CM | POA: Diagnosis present

## 2023-08-21 DIAGNOSIS — E1122 Type 2 diabetes mellitus with diabetic chronic kidney disease: Secondary | ICD-10-CM | POA: Diagnosis present

## 2023-08-21 DIAGNOSIS — Z91148 Patient's other noncompliance with medication regimen for other reason: Secondary | ICD-10-CM | POA: Diagnosis not present

## 2023-08-21 DIAGNOSIS — I340 Nonrheumatic mitral (valve) insufficiency: Secondary | ICD-10-CM | POA: Diagnosis present

## 2023-08-21 DIAGNOSIS — I5082 Biventricular heart failure: Secondary | ICD-10-CM | POA: Diagnosis present

## 2023-08-21 DIAGNOSIS — Z888 Allergy status to other drugs, medicaments and biological substances status: Secondary | ICD-10-CM

## 2023-08-21 DIAGNOSIS — I214 Non-ST elevation (NSTEMI) myocardial infarction: Secondary | ICD-10-CM | POA: Diagnosis present

## 2023-08-21 DIAGNOSIS — I5022 Chronic systolic (congestive) heart failure: Secondary | ICD-10-CM | POA: Diagnosis present

## 2023-08-21 LAB — COMPREHENSIVE METABOLIC PANEL WITH GFR
ALT: 14 U/L (ref 0–44)
AST: 40 U/L (ref 15–41)
Albumin: 3.4 g/dL — ABNORMAL LOW (ref 3.5–5.0)
Alkaline Phosphatase: 78 U/L (ref 38–126)
Anion gap: 12 (ref 5–15)
BUN: 26 mg/dL — ABNORMAL HIGH (ref 8–23)
CO2: 19 mmol/L — ABNORMAL LOW (ref 22–32)
Calcium: 9 mg/dL (ref 8.9–10.3)
Chloride: 111 mmol/L (ref 98–111)
Creatinine, Ser: 1.09 mg/dL — ABNORMAL HIGH (ref 0.44–1.00)
GFR, Estimated: 49 mL/min — ABNORMAL LOW (ref >60)
Glucose, Bld: 177 mg/dL — ABNORMAL HIGH (ref 70–99)
Potassium: 4.5 mmol/L (ref 3.5–5.1)
Sodium: 142 mmol/L (ref 135–145)
Total Bilirubin: 1.3 mg/dL — ABNORMAL HIGH (ref 0.0–1.2)
Total Protein: 7.2 g/dL (ref 6.5–8.1)

## 2023-08-21 LAB — CBC WITH DIFFERENTIAL/PLATELET
Abs Immature Granulocytes: 0.05 10*3/uL (ref 0.00–0.07)
Basophils Absolute: 0 10*3/uL (ref 0.0–0.1)
Basophils Relative: 0 %
Eosinophils Absolute: 0.1 10*3/uL (ref 0.0–0.5)
Eosinophils Relative: 1 %
HCT: 41 % (ref 36.0–46.0)
Hemoglobin: 13.3 g/dL (ref 12.0–15.0)
Immature Granulocytes: 1 %
Lymphocytes Relative: 12 %
Lymphs Abs: 0.9 10*3/uL (ref 0.7–4.0)
MCH: 30.4 pg (ref 26.0–34.0)
MCHC: 32.4 g/dL (ref 30.0–36.0)
MCV: 93.8 fL (ref 80.0–100.0)
Monocytes Absolute: 0.4 10*3/uL (ref 0.1–1.0)
Monocytes Relative: 5 %
Neutro Abs: 6.1 10*3/uL (ref 1.7–7.7)
Neutrophils Relative %: 81 %
Platelets: 313 10*3/uL (ref 150–400)
RBC: 4.37 MIL/uL (ref 3.87–5.11)
RDW: 16.9 % — ABNORMAL HIGH (ref 11.5–15.5)
WBC: 7.6 10*3/uL (ref 4.0–10.5)
nRBC: 0 % (ref 0.0–0.2)

## 2023-08-21 LAB — PROTIME-INR
INR: 1.6 — ABNORMAL HIGH (ref 0.8–1.2)
Prothrombin Time: 19.4 s — ABNORMAL HIGH (ref 11.4–15.2)

## 2023-08-21 LAB — HEPARIN LEVEL (UNFRACTIONATED): Heparin Unfractionated: 0.25 [IU]/mL — ABNORMAL LOW (ref 0.30–0.70)

## 2023-08-21 LAB — APTT: aPTT: >200 s (ref 24–36)

## 2023-08-21 LAB — TROPONIN I (HIGH SENSITIVITY)
Troponin I (High Sensitivity): 3389 ng/L (ref <18)
Troponin I (High Sensitivity): 3193 ng/L

## 2023-08-21 LAB — GLUCOSE, CAPILLARY
Glucose-Capillary: 155 mg/dL — ABNORMAL HIGH (ref 70–99)
Glucose-Capillary: 155 mg/dL — ABNORMAL HIGH (ref 70–99)

## 2023-08-21 LAB — BRAIN NATRIURETIC PEPTIDE: B Natriuretic Peptide: 1549.7 pg/mL — ABNORMAL HIGH (ref 0.0–100.0)

## 2023-08-21 MED ORDER — COLCHICINE 0.6 MG PO TABS: 0.6000 mg | ORAL_TABLET | Freq: Every day | ORAL | Status: DC | PRN

## 2023-08-21 MED ORDER — INSULIN ASPART 100 UNIT/ML IJ SOLN
0.0000 [IU] | Freq: Three times a day (TID) | INTRAMUSCULAR | Status: DC
  Administered 2023-08-22: 2 [IU] via SUBCUTANEOUS
  Administered 2023-08-22: 3 [IU] via SUBCUTANEOUS
  Administered 2023-08-23: 2 [IU] via SUBCUTANEOUS
  Administered 2023-08-23: 3 [IU] via SUBCUTANEOUS
  Administered 2023-08-23: 2 [IU] via SUBCUTANEOUS
  Administered 2023-08-24: 3 [IU] via SUBCUTANEOUS
  Administered 2023-08-24: 2 [IU] via SUBCUTANEOUS
  Administered 2023-08-25 (×2): 3 [IU] via SUBCUTANEOUS
  Administered 2023-08-25: 1 [IU] via SUBCUTANEOUS
  Administered 2023-08-26: 2 [IU] via SUBCUTANEOUS
  Administered 2023-08-26: 3 [IU] via SUBCUTANEOUS
  Administered 2023-08-26: 2 [IU] via SUBCUTANEOUS
  Filled 2023-08-21 (×14): qty 1

## 2023-08-21 MED ORDER — LOPERAMIDE HCL 2 MG PO CAPS: 2.0000 mg | ORAL_CAPSULE | ORAL | Status: DC | PRN

## 2023-08-21 MED ORDER — SENNOSIDES-DOCUSATE SODIUM 8.6-50 MG PO TABS: 1.0000 | ORAL_TABLET | Freq: Every evening | ORAL | Status: DC | PRN

## 2023-08-21 MED ORDER — ACETAMINOPHEN 650 MG RE SUPP: 650.0000 mg | Freq: Four times a day (QID) | RECTAL | Status: DC | PRN

## 2023-08-21 MED ORDER — ONDANSETRON HCL 4 MG/2ML IJ SOLN
4.0000 mg | Freq: Four times a day (QID) | INTRAMUSCULAR | Status: DC | PRN
  Filled 2023-08-21: qty 2

## 2023-08-21 MED ORDER — LOSARTAN POTASSIUM 25 MG PO TABS
12.5000 mg | ORAL_TABLET | Freq: Every day | ORAL | Status: DC
  Administered 2023-08-22 – 2023-08-31 (×10): 12.5 mg via ORAL
  Filled 2023-08-21 (×10): qty 1

## 2023-08-21 MED ORDER — CARVEDILOL 3.125 MG PO TABS
3.1250 mg | ORAL_TABLET | Freq: Two times a day (BID) | ORAL | Status: DC
  Administered 2023-08-21 – 2023-08-22 (×2): 3.125 mg via ORAL
  Filled 2023-08-21 (×2): qty 1

## 2023-08-21 MED ORDER — ATORVASTATIN CALCIUM 20 MG PO TABS
40.0000 mg | ORAL_TABLET | Freq: Every day | ORAL | Status: DC
  Administered 2023-08-21 – 2023-08-31 (×11): 40 mg via ORAL
  Filled 2023-08-21 (×11): qty 2

## 2023-08-21 MED ORDER — SODIUM CHLORIDE 0.9 % IV SOLN
1.0000 g | INTRAVENOUS | Status: DC
  Filled 2023-08-21: qty 10

## 2023-08-21 MED ORDER — MEMANTINE HCL 10 MG PO TABS
20.0000 mg | ORAL_TABLET | Freq: Every evening | ORAL | Status: DC
  Administered 2023-08-22 – 2023-08-30 (×9): 20 mg via ORAL
  Filled 2023-08-21 (×9): qty 2

## 2023-08-21 MED ORDER — DAPAGLIFLOZIN PROPANEDIOL 10 MG PO TABS
10.0000 mg | ORAL_TABLET | Freq: Every day | ORAL | Status: DC
  Administered 2023-08-22 – 2023-08-31 (×10): 10 mg via ORAL
  Filled 2023-08-21 (×10): qty 1

## 2023-08-21 MED ORDER — BUSPIRONE HCL 15 MG PO TABS
7.5000 mg | ORAL_TABLET | Freq: Two times a day (BID) | ORAL | Status: DC
  Filled 2023-08-21: qty 1

## 2023-08-21 MED ORDER — LACTATED RINGERS IV BOLUS
1000.0000 mL | Freq: Once | INTRAVENOUS | Status: AC
  Administered 2023-08-21: 1000 mL via INTRAVENOUS

## 2023-08-21 MED ORDER — ACETAMINOPHEN 325 MG PO TABS
650.0000 mg | ORAL_TABLET | Freq: Four times a day (QID) | ORAL | Status: DC | PRN
  Administered 2023-08-24 – 2023-08-26 (×2): 650 mg via ORAL
  Filled 2023-08-21 (×2): qty 2

## 2023-08-21 MED ORDER — HEPARIN BOLUS VIA INFUSION
4000.0000 [IU] | Freq: Once | INTRAVENOUS | Status: AC
  Administered 2023-08-21: 4000 [IU] via INTRAVENOUS
  Filled 2023-08-21: qty 4000

## 2023-08-21 MED ORDER — SODIUM CHLORIDE 0.9 % IV SOLN
100.0000 mg | Freq: Once | INTRAVENOUS | Status: AC
  Administered 2023-08-21: 100 mg via INTRAVENOUS
  Filled 2023-08-21 (×2): qty 100

## 2023-08-21 MED ORDER — ONDANSETRON HCL 4 MG PO TABS: 4.0000 mg | ORAL_TABLET | Freq: Four times a day (QID) | ORAL | Status: DC | PRN

## 2023-08-21 MED ORDER — CLOPIDOGREL BISULFATE 75 MG PO TABS
75.0000 mg | ORAL_TABLET | Freq: Every day | ORAL | Status: DC
  Administered 2023-08-21 – 2023-08-31 (×11): 75 mg via ORAL
  Filled 2023-08-21 (×11): qty 1

## 2023-08-21 MED ORDER — DOXYCYCLINE HYCLATE 100 MG PO TABS
100.0000 mg | ORAL_TABLET | Freq: Two times a day (BID) | ORAL | Status: DC
  Administered 2023-08-22: 100 mg via ORAL
  Filled 2023-08-21: qty 1

## 2023-08-21 MED ORDER — RISPERIDONE 0.5 MG PO TABS
0.5000 mg | ORAL_TABLET | Freq: Two times a day (BID) | ORAL | Status: DC | PRN
  Filled 2023-08-21: qty 1

## 2023-08-21 MED ORDER — ASPIRIN 81 MG PO TBEC
81.0000 mg | DELAYED_RELEASE_TABLET | Freq: Every day | ORAL | Status: DC
  Administered 2023-08-22 – 2023-08-31 (×10): 81 mg via ORAL
  Filled 2023-08-21 (×10): qty 1

## 2023-08-21 MED ORDER — SODIUM CHLORIDE 0.9 % IV SOLN
1.0000 g | Freq: Once | INTRAVENOUS | Status: AC
  Administered 2023-08-21: 1 g via INTRAVENOUS
  Filled 2023-08-21: qty 10

## 2023-08-21 MED ORDER — METFORMIN HCL 500 MG PO TABS
1000.0000 mg | ORAL_TABLET | Freq: Two times a day (BID) | ORAL | Status: DC
  Administered 2023-08-22: 1000 mg via ORAL
  Filled 2023-08-21: qty 2

## 2023-08-21 MED ORDER — INSULIN ASPART 100 UNIT/ML IJ SOLN: 0.0000 [IU] | Freq: Every day | INTRAMUSCULAR | Status: DC

## 2023-08-21 MED ORDER — HEPARIN BOLUS VIA INFUSION
1050.0000 [IU] | Freq: Once | INTRAVENOUS | Status: AC
  Administered 2023-08-21: 1050 [IU] via INTRAVENOUS
  Filled 2023-08-21: qty 1050

## 2023-08-21 MED ORDER — HEPARIN (PORCINE) 25000 UT/250ML-% IV SOLN
950.0000 [IU]/h | INTRAVENOUS | Status: AC
  Administered 2023-08-21: 850 [IU]/h via INTRAVENOUS
  Administered 2023-08-22: 950 [IU]/h via INTRAVENOUS
  Filled 2023-08-21 (×3): qty 250

## 2023-08-21 NOTE — H&P (Addendum)
 History and Physical  [name] [name] ZOX:[id number] DOB: 16-Jul-1935 DOA: 08/21/2023  PCP: Melchor Spoon, MD   Chief Complaint: Generalized weakness  HPI: [name] [name] is a 88 y.o. female with medical history significant for HFrEF, severe TR, type 2 diabetes, dementia, history of CVA, and hyperlipidemia who presents to the ED via EMS for evaluation of generalized weakness. Patient only oriented to self and person so history obtained from daughter. Per daughter, patient has been weak since her last hospitalization but then after completed 3 weeks of rehab. Last Friday, she started having left-sided chest pain so EMS was called. An EKG was done and patient was given nitro resolved, to the ED for further evaluation but patient declined.  Patient has continued to week and lethargic and occasionally agitated at night. She has had 2-3 falls in the past month due to significant weakness. Patient has had mild shortness of breath but no fevers, chills, dysuria, cough, diarrhea or constipation.  ED Course: Initial vitals stable, afebrile and normotensive. Labs significant for troponin of 3389->3193, BNP 1549, normal CBC, blood glucose 177, normal kidney function. EKG shows sinus rhythm with nonspecific ST changes. Chest x-ray shows left lower lobe pneumonia. Patient received IV Rocephin, IV doxycycline 1 L IV NS bolus and started on heparin drip. Cardiology was consulted for evaluation. TRH was consulted for admission.  Review of Systems: Please see HPI for pertinent positives and negatives. A complete 10 system review of systems are otherwise negative.  Past Medical History:  Diagnosis Date   Cancer [facility])    right breast cancer   Diabetes mellitus without complication (HCC)    Hypertension    Stroke [facility])    Past Surgical History:  Procedure Laterality Date   BREAST SURGERY     right lumpectomy   KNEE SURGERY     bilateral   Social History:  reports that she quit smoking about 35 years ago.  Her smoking use included cigarettes. She has never used smokeless tobacco. She reports that she does not drink alcohol and does not use drugs.  Allergies  Allergen Reactions   Codeine Other (See Comments)    "I go crazy"   Etodolac     Other reaction(s): Unknown   Indomethacin     Other reaction(s): Unknown   Penicillins Other (See Comments)    Does not remember this allergy Has patient had a PCN reaction causing immediate rash, facial/tongue/throat swelling, SOB or lightheadedness with hypotension: Unknown Has patient had a PCN reaction causing severe rash involving mucus membranes or skin necrosis: Unknown Has patient had a PCN reaction that required hospitalization: Unknown Has patient had a PCN reaction occurring within the last 10 years: Unknown If all of the above answers are "NO", then may proceed with Cephalosporin use.    Tizanidine     Other reaction(s): Syncope Fell on the floor    Tramadol     Other reaction(s): Unknown    History reviewed. No pertinent family history.   Prior to Admission medications   Medication Sig Start Date End Date Taking? Authorizing Provider  colchicine  0.6 MG tablet Take 0.6 mg by mouth daily as needed. 08/12/23  Yes [provider]  furosemide  (LASIX ) 20 MG tablet Take 20 mg by mouth daily. 08/04/23  Yes [provider]  losartan  (COZAAR ) 25 MG tablet Take 12.5 mg by mouth daily. 08/12/23  Yes [provider]  nystatin (MYCOSTATIN/NYSTOP) powder Apply 1 Application topically 3 (three) times daily. 08/04/23  Yes [provider]  risperiDONE (RISPERDAL) 0.5 MG tablet Take 0.5 mg by mouth 2 (two) times daily as needed. 08/15/23  Yes [provider]  spironolactone  (ALDACTONE ) 25 MG tablet Take 12.5 mg by mouth daily. 08/12/23  Yes [provider]  allopurinol  (ZYLOPRIM ) 100 MG tablet Take 1 tablet by mouth daily. 03/05/23   [provider]  aspirin  EC 81 MG tablet Take 1 tablet (81 mg total)  by mouth daily. Swallow whole. 04/04/23   Alexander, Natalie, DO  busPIRone (BUSPAR) 7.5 MG tablet Take 7.5 mg by mouth 2 (two) times daily. 07/10/23 07/09/24  [provider]  carvedilol  (COREG ) 3.125 MG tablet Take 1 tablet (3.125 mg total) by mouth 2 (two) times daily with a meal. 07/19/23   Ezzard Holms, MD  dapagliflozin  propanediol (FARXIGA ) 10 MG TABS tablet Take 1 tablet (10 mg total) by mouth daily. 07/20/23   Ezzard Holms, MD  loperamide (LOPERAMIDE A-D) 2 MG tablet Take 2 mg by mouth as needed for diarrhea or loose stools.    [provider]  memantine  (NAMENDA ) 10 MG tablet Take 2 tablets by mouth every evening.  10/26/15   [provider]  metFORMIN (GLUCOPHAGE) 1000 MG tablet Take 1 tablet by mouth 2 (two) times daily. Take 1 tablet in the morning and 1 tablet in the evening 01/11/16   [provider]  polyethylene glycol (MIRALAX  / GLYCOLAX ) packet Take 17 g by mouth daily as needed (Constipation). 02/04/18   Salary, Delon Ferris D, MD  pravastatin  (PRAVACHOL ) 20 MG tablet Take 1 tablet by mouth every evening.  01/02/16   [provider]  promethazine (PHENERGAN) 12.5 MG tablet Take 12.5 mg by mouth every 4 (four) hours as needed.    [provider]  sodium chloride  (OCEAN) 0.65 % SOLN nasal spray Place 1 spray into both nostrils as needed for congestion.    [provider]    Physical Exam: BP 122/78 (BP Location: Right Arm)   Pulse 89   Temp (!) 97.5 F (36.4 C) (Oral)   Resp 18   Ht 5\' 5"  (1.651 m)   Wt 72.8 kg   SpO2 100%   BMI 26.69 kg/m  General: Pleasant, chronically ill elderly woman laying in bed. No acute distress. HEENT: [place]/AT. Anicteric sclera CV: RRR. No murmurs, rubs, or gallops. No LE edema Pulmonary: Lungs CTAB. Normal effort. Diminished breath sounds throughout. Distant crackles at the bases. Abdominal: Soft, nontender, nondistended. Normal bowel sounds. Extremities: Palpable radial and DP pulses.  Normal ROM. Skin: Warm and dry. No obvious rash or lesions. Neuro: Alert and oriented to self only. Moves all extremities. Normal sensation to light touch. No focal deficit.          Labs on Admission:  Basic Metabolic Panel: Recent Labs  Lab 08/21/23 1053  NA 142  K 4.5  CL 111  CO2 19*  GLUCOSE 177*  BUN 26*  CREATININE 1.09*  CALCIUM 9.0   Liver Function Tests: Recent Labs  Lab 08/21/23 1053  AST 40  ALT 14  ALKPHOS 78  BILITOT 1.3*  PROT 7.2  ALBUMIN 3.4*   No results for input(s): "LIPASE", "AMYLASE" in the last 168 hours. No results for input(s): "AMMONIA" in the last 168 hours. CBC: Recent Labs  Lab 08/21/23 1053  WBC 7.6  NEUTROABS 6.1  HGB 13.3  HCT 41.0  MCV 93.8  PLT 313   Cardiac Enzymes: No results for input(s): "CKTOTAL", "CKMB", "CKMBINDEX", "TROPONINI" in the last 168 hours. BNP (last 3  results) Recent Labs    07/09/23 1420 07/14/23 1017 08/21/23 1053  BNP 1,349.6* 1,371.2* 1,549.7*    ProBNP (last 3 results) No results for input(s): "PROBNP" in the last 8760 hours.  CBG: No results for input(s): "GLUCAP" in the last 168 hours.  Radiological Exams on Admission: CT Head Wo Contrast Result Date: 08/21/2023 CLINICAL DATA:  Neck trauma EXAM: CT HEAD WITHOUT CONTRAST CT CERVICAL SPINE WITHOUT CONTRAST TECHNIQUE: Multidetector CT imaging of the head and cervical spine was performed following the standard protocol without intravenous contrast. Multiplanar CT image reconstructions of the cervical spine were also generated. RADIATION DOSE REDUCTION: This exam was performed according to the departmental dose-optimization program which includes automated exposure control, adjustment of the mA and/or kV according to patient size and/or use of iterative reconstruction technique. COMPARISON:  07/14/2023 FINDINGS: CT HEAD FINDINGS Brain: No evidence of acute infarction, hemorrhage, hydrocephalus, extra-axial collection or mass lesion/mass effect.  Pronounced generalized brain atrophy. Chronic small vessel ischemia in the deep cerebral white matter. Encephalomalacia in the cerebellum, usually post ischemic although centered in the midline. Vascular: No hyperdense vessel or unexpected calcification. Skull: Normal. Negative for fracture or focal lesion. Sinuses/Orbits: Negative CT CERVICAL SPINE FINDINGS Alignment: Normal. Skull base and vertebrae: No acute fracture. No primary bone lesion or focal pathologic process. Soft tissues and spinal canal: No prevertebral fluid or swelling. No visible canal hematoma. Disc levels:  Mild degenerative spurring. Upper chest: Small layering pleural effusions IMPRESSION: No evidence of acute intracranial or cervical spine injury. Electronically Signed   By: Ronnette Coke M.D.   On: 08/21/2023 12:13   CT Cervical Spine Wo Contrast Result Date: 08/21/2023 CLINICAL DATA:  Neck trauma EXAM: CT HEAD WITHOUT CONTRAST CT CERVICAL SPINE WITHOUT CONTRAST TECHNIQUE: Multidetector CT imaging of the head and cervical spine was performed following the standard protocol without intravenous contrast. Multiplanar CT image reconstructions of the cervical spine were also generated. RADIATION DOSE REDUCTION: This exam was performed according to the departmental dose-optimization program which includes automated exposure control, adjustment of the mA and/or kV according to patient size and/or use of iterative reconstruction technique. COMPARISON:  07/14/2023 FINDINGS: CT HEAD FINDINGS Brain: No evidence of acute infarction, hemorrhage, hydrocephalus, extra-axial collection or mass lesion/mass effect. Pronounced generalized brain atrophy. Chronic small vessel ischemia in the deep cerebral white matter. Encephalomalacia in the cerebellum, usually post ischemic although centered in the midline. Vascular: No hyperdense vessel or unexpected calcification. Skull: Normal. Negative for fracture or focal lesion. Sinuses/Orbits: Negative CT CERVICAL  SPINE FINDINGS Alignment: Normal. Skull base and vertebrae: No acute fracture. No primary bone lesion or focal pathologic process. Soft tissues and spinal canal: No prevertebral fluid or swelling. No visible canal hematoma. Disc levels:  Mild degenerative spurring. Upper chest: Small layering pleural effusions IMPRESSION: No evidence of acute intracranial or cervical spine injury. Electronically Signed   By: Ronnette Coke M.D.   On: 08/21/2023 12:13   DG Chest 2 View Result Date: 08/21/2023 CLINICAL DATA:  Weakness EXAM: CHEST - 2 VIEW COMPARISON:  July 09, 2023 FINDINGS: Bilateral pulmonary infiltrates with consolidative changes of the left lower lobe may correlate with pneumonia. Superimposed congestive changes?. Heart and mediastinum upper limits of normal IMPRESSION: Infiltrates as described could correlate with pneumonia Electronically Signed   By: Fredrich Jefferson M.D.   On: 08/21/2023 11:45   Assessment/Plan Shenicka Coffield is a 88 y.o. female with medical history significant for HFrEF, severe TR, type 2 diabetes, dementia, history of CVA, and hyperlipidemia who presents to the  ED via EMS for evaluation of generalized weakness and admitted for NSTEMI.  # NSTEMI - Patient with reported left-sided chest pain 3 days ago now with generalized weakness -Troponins > 3000, EKG with nonspecific ST changes -Cardiology consulted for evaluation -Patient a poor candidate for invasive evaluation, cardiology discussed this with patient and daughter and they declined any invasive evaluation and wants to be treated medically. - Continue IV heparin for 48 hours per cardiology - DAPT with aspirin  and Plavix for 1 year - Continue Coreg  - Start atorvastatin - Telemetry  # HFrEF - Last TTE 5 months ago showed EF 25-25%, LV global hypokinesis, G3 DD and severe tricuspid valve regurgitation - Patient not fluid overloaded on exam - Continue Coreg , losartan  and Jardiance - Hold home Lasix  and spironolactone  today,  consider resuming tomorrow if BP allows - Strict I&O's, daily weight  # ?Community-acquired pneumonia - Presented with generalized weakness and mild shortness of breath but with no cough, fevers or chills. - Chest x-ray shows infiltrate in the left lower lobe concerning for pneumonia - Patient afebrile with no leukocytosis - Continue IV Rocephin and doxycycline for now and discontinue if procalcitonin is normal tomorrow  # Dementia - Continue memantine  - As needed Risperdal for agitation  # T2DM - CBGs in the 150s - Continue metformin - SSI with meals, CBG monitoring  # Hx of CVA # HLD - Continue aspirin , Plavix and atorvastatin  # Generalized weakness - PT/OT eval and treat  DVT prophylaxis: Heparin    Code Status: Limited: Do not attempt resuscitation (DNR) -DNR-LIMITED -Do Not Intubate/DNI   Consults called: Cardiology  Family Communication: Discussed admission with daughter and granddaughter at bedside  Severity of Illness: The appropriate patient status for this patient is INPATIENT. Inpatient status is judged to be reasonable and necessary in order to provide the required intensity of service to ensure the patient's safety. The patient's presenting symptoms, physical exam findings, and initial radiographic and laboratory data in the context of their chronic comorbidities is felt to place them at high risk for further clinical deterioration. Furthermore, it is not anticipated that the patient will be medically stable for discharge from the hospital within 2 midnights of admission.   * I certify that at the point of admission it is my clinical judgment that the patient will require inpatient hospital care spanning beyond 2 midnights from the point of admission due to high intensity of service, high risk for further deterioration and high frequency of surveillance required.*  Level of care: Telemetry Cardiac   This record has been created using Dragon voice recognition  software. Errors have been sought and corrected, but may not always be located. Such creation errors do not reflect on the standard of care.   Vita Grip, MD 08/21/2023, 2:41 PM [facility] Pager: [phone] Isaiah 41:10   If 7PM-7AM, please contact night-coverage [email or web address] Password [email or web address]

## 2023-08-21 NOTE — ED Triage Notes (Signed)
 Pt to ED via [facility] from [facility] independent living for c/o generalized weakness x 4 weeks.   BP 134/78 HR 99 RR 20 CBG 164

## 2023-08-21 NOTE — ED Provider Notes (Signed)
 [facility] Provider Note    Event Date/Time   First MD Initiated Contact with Patient 08/21/23 1052     (approximate)   History   Weakness   HPI  [name] [name] is a 88 y.o. female with history of diabetes, hypertension, history of CVA, dementia, presenting from [facility] independent living for generalized weakness.  Patient states has been ongoing for 4 weeks.  Per EMS they had asked her if she had follow-up with the primary care doctor, she says she did not, asked her what changed to make, today, patient stated that she just decided that she wanted to be seen today, there were no additional changes.  She noted falling 2-3 times in the past month, was seen here in early May for a fall, she does not remember if she had fallen after that ED visit.  She denies any urinary symptoms, no nausea, vomiting, or diarrhea.  Does have left foot pain that she said they x-rayed that was negative for acute fractures.  No numbness or focal weakness, no headache or vision changes, no chest pain or shortness of breath or cough.  Patient states that she does not want to go to a SNF for rehab.  Independent history obtained from EMS as above.  On independent chart review, patient came to the emergency department with left before evaluation, had a CT head that was done at the time that was negative for acute intracranial pathology.  She was admitted in April for CHF exacerbation and generalized weakness.  Complained of left foot pain at the time, x-ray was done that did not show acute fracture.  Had a CT PE study that was done that showed bilateral pleural effusions but negative for PE.     Physical Exam   Triage Vital Signs: ED Triage Vitals [08/21/23 1051]  Encounter Vitals Group     BP      Systolic BP Percentile      Diastolic BP Percentile      Pulse      Resp      Temp      Temp src      SpO2      Weight      Height      Head Circumference      Peak Flow       Pain Score 0     Pain Loc      Pain Education      Exclude from Growth Chart     Most recent vital signs: Vitals:   08/21/23 1055  BP: 122/78  Pulse: 89  Resp: 18  Temp: (!) 97.5 F (36.4 C)  SpO2: 100%     General: Awake, no distress.  Alert and oriented x 3 CV:  Good peripheral perfusion.  Resp:  Normal effort.  No thoracic cage tenderness Abd:  No distention.  Soft nontender Other:  No palpable skull deformities or tenderness, no midline spinal tenderness, no tenderness bilateral upper extremity and right lower extremity, she has mild tenderness to dorsum of her left foot, there is no overlying swelling, ecchymoses.  Nontender to her hip, femur, knee, tib-fib, ankle.  No focal weakness or numbness, she has palpable DP pulses bilaterally.   ED Results / Procedures / Treatments   Labs (all labs ordered are listed, but only abnormal results are displayed) Labs Reviewed  COMPREHENSIVE METABOLIC PANEL WITH GFR - Abnormal; Notable for the following components:      Result Value  CO2 19 (*)    Glucose, Bld 177 (*)    BUN 26 (*)    Creatinine, Ser 1.09 (*)    Albumin 3.4 (*)    Total Bilirubin 1.3 (*)    GFR, Estimated 49 (*)    All other components within normal limits  CBC WITH DIFFERENTIAL/PLATELET - Abnormal; Notable for the following components:   RDW 16.9 (*)    All other components within normal limits  BRAIN NATRIURETIC PEPTIDE - Abnormal; Notable for the following components:   B Natriuretic Peptide 1,549.7 (*)    All other components within normal limits  TROPONIN I (HIGH SENSITIVITY) - Abnormal; Notable for the following components:   Troponin I (High Sensitivity) 3,389 (*)    All other components within normal limits  URINALYSIS, W/ REFLEX TO CULTURE (INFECTION SUSPECTED)  APTT  PROTIME-INR  HEPARIN LEVEL (UNFRACTIONATED)  TROPONIN I (HIGH SENSITIVITY)     EKG  EKG shows, sinus rhythm, rate 94, normal QRS, normal QTc, baseline is wandering, no  obvious ischemic ST elevation, T wave flattening in 1, V3, T wave changes new compared to prior   RADIOLOGY On my independent interpretation, CT head without obvious intracranial hemorrhage   PROCEDURES:  Critical Care performed: Yes, see critical care procedure note(s)  .Critical Care  Performed by: Shane Darling, MD Authorized by: Shane Darling, MD   Critical care provider statement:    Critical care time (minutes):  40   Critical care was necessary to treat or prevent imminent or life-threatening deterioration of the following conditions:  Cardiac failure   Critical care was time spent personally by me on the following activities:  Development of treatment plan with patient or surrogate, discussions with consultants, evaluation of patient's response to treatment, examination of patient, ordering and review of laboratory studies, ordering and review of radiographic studies, ordering and performing treatments and interventions, pulse oximetry, re-evaluation of patient's condition and review of old charts    MEDICATIONS ORDERED IN ED: Medications  cefTRIAXone (ROCEPHIN) 1 g in sodium chloride  0.9 % 100 mL IVPB (has no administration in time range)  doxycycline (VIBRAMYCIN) 100 mg in sodium chloride  0.9 % 250 mL IVPB (has no administration in time range)  heparin bolus via infusion 4,000 Units (has no administration in time range)  heparin ADULT infusion 100 units/mL (25000 units/250mL) (has no administration in time range)  lactated ringers bolus 1,000 mL (1,000 mLs Intravenous New Bag/Given 08/21/23 1101)     IMPRESSION / MDM / ASSESSMENT AND PLAN / ED COURSE  I reviewed the triage vital signs and the nursing notes.                              Differential diagnosis includes, but is not limited to, atypical ACS, electrolyte derangements, CHF, occult infection, conditioning.  To get labs, EKG, troponin, chest x-ray, UA, CT head, cervical spine.  Patient states that her left foot  pain is not new, had an x-ray that was done a month ago that was negative.  Will hold off repeat imaging at this time.  Patient's presentation is most consistent with acute presentation with potential threat to life or bodily function.  Independent interpretation of labs and imaging below.  Presenting with weakness, troponin is quite elevated, consulted cardiology who agreed with admission and heparin.  Consult hospitalist was agreeable to plan for admission will evaluate the patient.  She is admitted.  The patient is on the  cardiac monitor to evaluate for evidence of arrhythmia and/or significant heart rate changes.   Clinical Course as of 08/21/23 1241  Mon Aug 21, 2023  1141 Consulted cardiologist Dr. Alvenia Aus who will evaluate the patient. [TT]  1152 DG Chest 2 View Infiltrates as described could correlate with pneumonia  [TT]  1215 Had called radiology to expedite the CT imaging results, they said that the radiologist is already evaluating the images and will upload a read soon. [TT]  1223 CT Head Wo Contrast No evidence of acute intracranial or cervical spine injury.  [TT]    Clinical Course User Index [TT] Shane Darling, MD     FINAL CLINICAL IMPRESSION(S) / ED DIAGNOSES   Final diagnoses:  Weakness  NSTEMI (non-ST elevated myocardial infarction) (HCC)  Pneumonia due to infectious organism, unspecified laterality, unspecified part of lung     Rx / DC Orders   ED Discharge Orders     None        Note:  This document was prepared using Dragon voice recognition software and may include unintentional dictation errors.    Shane Darling, MD 08/21/23 [phone]

## 2023-08-21 NOTE — Consult Note (Signed)
 PHARMACY - ANTICOAGULATION CONSULT NOTE  Pharmacy Consult for heparin infusion Indication: chest pain/ACS  Allergies  Allergen Reactions   Codeine Other (See Comments)    "I go crazy"   Etodolac     Other reaction(s): Unknown   Indomethacin     Other reaction(s): Unknown   Penicillins Other (See Comments)    Does not remember this allergy Has patient had a PCN reaction causing immediate rash, facial/tongue/throat swelling, SOB or lightheadedness with hypotension: Unknown Has patient had a PCN reaction causing severe rash involving mucus membranes or skin necrosis: Unknown Has patient had a PCN reaction that required hospitalization: Unknown Has patient had a PCN reaction occurring within the last 10 years: Unknown If all of the above answers are "NO", then may proceed with Cephalosporin use.    Tizanidine     Other reaction(s): Syncope Fell on the floor    Tramadol     Other reaction(s): Unknown    Patient Measurements: Height: 5\' 5"  (165.1 cm) Weight: 72.8 kg (160 lb 6.4 oz) IBW/kg (Calculated) : 57 HEPARIN DW (KG): 71.7  Vital Signs: Temp: 97.5 F (36.4 C) (05/12 1055) Temp Source: Oral (05/12 1055) BP: 122/78 (05/12 1055) Pulse Rate: 89 (05/12 1055)  Labs: Recent Labs    08/21/23 1053  HGB 13.3  HCT 41.0  PLT 313  CREATININE 1.09*  TROPONINIHS 3,389*    Estimated Creatinine Clearance: 37 mL/min (A) (by C-G formula based on SCr of 1.09 mg/dL (H)).   Medical History: Past Medical History:  Diagnosis Date   Cancer [facility])    right breast cancer   Diabetes mellitus without complication (HCC)    Hypertension    Stroke (HCC)     Medications:  No home anticoagulants per pharmacist review  Assessment: 88 yo female presented to the ED from independent living facility with generalized weakness.  PMH includes anxiety, GERD, HLD, Pulm HTN, T2DM, CHF, and CVA.  Troponin found to be elevated.  Pharmacy consulted to initiate heparin infusion.  Baseline aPTT  and PT/INR ordered  Goal of Therapy:  Heparin level 0.3-0.7 units/ml Monitor platelets by anticoagulation protocol: Yes   Plan:  Give 4000 units bolus x 1 Start heparin infusion at 850 units/hr Check anti-Xa level in 8 hours and daily while on heparin Continue to monitor H&H and platelets  Ramonita Burow, PharmD 08/21/2023,12:26 PM

## 2023-08-21 NOTE — Consult Note (Signed)
 Cardiology Consultation   Patient ID: [name] [name] MRN: [id number]; DOB: 07/22/1935  Admit date: 08/21/2023 Date of Consult: 08/21/2023  PCP:  Melchor Spoon, MD   [facility] Providers Cardiologist:  None        Patient Profile:   [name] [name] is a 88 y.o. female with a hx of HFrEF, severe TR, type 2 diabetes, dementia, history of CVA, and hyperlipidemia who is being seen 08/21/2023 for the evaluation of evaded troponin at the request of Dr. Victorio Grave.  History of Present Illness:   [name] was previously seen by [facility] cardiology in 2015.  At that time echo showed EF 55% and moderate mitral regurgitation.  Repeat echo 03/2022 showed severe TR and severe RV failure based on Care Everywhere.  Patient was admitted to [facility] 03/31/2019 for with complaints of dizziness and fatigue.  CT chest showed moderate size right pleural effusion that was not amendable to thoracentesis.  Labs were significant for elevated troponin of 200.  CT notable for extensive multivessel coronary artery calcifications.  Patient refused left heart catheterization at that time.  Echo showed EF 20 to 25% with global hypokinesis, elevated left ventricular end-diastolic pressure, G3 DD, mild MR and right atrial pressure of 8 mmHg.  Patient also declined right heart catheterization at that time.  She was discharged on aspirin , furosemide  20 mg daily, losartan  12.5 mg daily, pravastatin  20 mg daily, and spironolactone  12.5 mg daily.  It was recommended that she follow-up with the advanced heart failure clinic.  However, this appointment was canceled by patient and she was lost to follow-up.    Patient has been seen in the [facility] ED several times since this admission for weakness.  She was most recently admitted 07/14/2023 through 07/19/2023 with symptoms of generalized weakness and shortness of breath.  She was admitted for management of acute on chronic heart failure.  The cardiology team was not consulted during this  admission.  She underwent IV diuresis.  Patient's renal function was mildly worsened and subsequently Lasix  was withheld.  Blood pressure was also soft, losartan  and spironolactone  were held.  She was discharged on aspirin , carvedilol  3.125 mg twice daily, Farxiga  10 mg daily, and pravastatin  20 mg daily.  Patient has memory impairment as provided mostly by daughter and granddaughter that are at the bedside.  Patient lives in an independent living facility and her granddaughter assists with medication management.  She reports that the patient is mostly compliant with her medications, although she refuses to take Lasix .  Family reports that patient frequently refuses to attend medical appointments, making continuation of care difficult.  Patient's daughter and granddaughter report that she has been increasingly weak/fatigued for the past several days.  They also note increased lower extremity swelling.  Baseline weight is unknown.  The patient herself does not have any complaints.  Patient's daughter tells me that either Friday or Saturday she recalls patient complaining of left-sided chest pain.  EMS was called but by the time they arrived, patient was no longer experiencing pain.  EMS reported normal EKG, and patient refused to be transported to the emergency department.  She has not complained of any chest pain since this time.  Patient denies chest pain, nausea, vomiting, fever, and palpitations.  She endorses intermittent lightheadedness and shortness of breath.   On arrival to the ED, vital signs within normal limits.  Labs notable for elevated glucose 177, BUN 26, CR 1.09.  BNP elevated at 1549.  Troponin elevated at 3389 >  3193.  EKG without acute ischemic changes.  Chest x-ray shows infiltrates that could correlate with pneumonia.  CT head and neck without acute process.  Started on IV ceftriaxone and IV fluids.  Patient was started on IV heparin for elevated troponin and cardiology was asked to  consult.  Past Medical History:  Diagnosis Date   Cancer [facility])    right breast cancer   Diabetes mellitus without complication (HCC)    Hypertension    Stroke [facility])     Past Surgical History:  Procedure Laterality Date   BREAST SURGERY     right lumpectomy   KNEE SURGERY     bilateral       Inpatient Medications: Scheduled Meds:  heparin  4,000 Units Intravenous Once   Continuous Infusions:  cefTRIAXone (ROCEPHIN)  IV     doxycycline (VIBRAMYCIN) IV     heparin     PRN Meds:   Allergies:    Allergies  Allergen Reactions   Codeine Other (See Comments)    "I go crazy"   Etodolac     Other reaction(s): Unknown   Indomethacin     Other reaction(s): Unknown   Penicillins Other (See Comments)    Does not remember this allergy Has patient had a PCN reaction causing immediate rash, facial/tongue/throat swelling, SOB or lightheadedness with hypotension: Unknown Has patient had a PCN reaction causing severe rash involving mucus membranes or skin necrosis: Unknown Has patient had a PCN reaction that required hospitalization: Unknown Has patient had a PCN reaction occurring within the last 10 years: Unknown If all of the above answers are "NO", then may proceed with Cephalosporin use.    Tizanidine     Other reaction(s): Syncope Fell on the floor    Tramadol     Other reaction(s): Unknown    Social History:   Social History   Socioeconomic History   Marital status: Widowed    Spouse name: Not on file   Number of children: Not on file   Years of education: Not on file   Highest education level: Not on file  Occupational History   Not on file  Tobacco Use   Smoking status: Former    Current packs/day: 0.00    Types: Cigarettes    Quit date: 32    Years since quitting: 35.3   Smokeless tobacco: Never  Vaping Use   Vaping status: Never Used  Substance and Sexual Activity   Alcohol use: No   Drug use: No   Sexual activity: Not on file  Other Topics  Concern   Not on file  Social History Narrative   Not on file   Social Drivers of Health   Financial Resource Strain: Low Risk  (02/21/2023)   Received from [facility]   Overall Financial Resource Strain (CARDIA)    Difficulty of Paying Living Expenses: Not hard at all  Food Insecurity: No Food Insecurity (07/14/2023)   Hunger Vital Sign    Worried About Running Out of Food in the Last Year: Never true    Ran Out of Food in the Last Year: Never true  Transportation Needs: No Transportation Needs (07/14/2023)   PRAPARE - Administrator, Civil Service (Medical): No    Lack of Transportation (Non-Medical): No  Physical Activity: Not on file  Stress: Not on file  Social Connections: Unknown (07/15/2023)   Social Connection and Isolation Panel [NHANES]    Frequency of Communication with Friends and Family: Three times  a week    Frequency of Social Gatherings with Friends and Family: Twice a week    Attends Religious Services: Patient declined    Database administrator or Organizations: Patient declined    Attends Banker Meetings: Patient declined    Marital Status: Patient declined  Intimate Partner Violence: Not At Risk (07/14/2023)   Humiliation, Afraid, Rape, and Kick questionnaire    Fear of Current or Ex-Partner: No    Emotionally Abused: No    Physically Abused: No    Sexually Abused: No    Family History:   History reviewed. No pertinent family history.   ROS:  Please see the history of present illness.   Physical Exam/Data:   Vitals:   08/21/23 1054 08/21/23 1055  BP:  122/78  Pulse:  89  Resp:  18  Temp:  (!) 97.5 F (36.4 C)  TempSrc:  Oral  SpO2:  100%  Weight: 72.8 kg   Height: 5\' 5"  (1.651 m)    No intake or output data in the 24 hours ending 08/21/23 1239    08/21/2023   10:54 AM 08/10/2023   11:40 AM 07/19/2023    2:54 AM  Last 3 Weights  Weight (lbs) 160 lb 6.4 oz 161 lb 159 lb 13.3 oz  Weight (kg) 72.757 kg  73.029 kg 72.5 kg     Body mass index is 26.69 kg/m.  General:  Well nourished, well developed, in no acute distress HEENT: normal Neck: Difficult to assess JVD Vascular: No carotid bruits; Distal pulses 2+ bilaterally Cardiac:  normal S1, S2; RRR; no murmur  Lungs: Faint crackles Abd: soft, nontender, no hepatomegaly  Ext: no edema Skin: warm and dry  Psych:  Normal affect   EKG:  The EKG was personally reviewed and demonstrates:  sinus rhythm with prior anterolateral and inferior infarct, left axis deviation, IVCD, nonspecific ST/T wave abnormalities Telemetry:  Telemetry was personally reviewed and demonstrates: sinus rhythm with PVCs  Relevant CV Studies:  04/01/2023 Echo complete 1. Left ventricular ejection fraction, by estimation, is 20 to 25%. Left  ventricular ejection fraction by 3D volume is 21 %. The left ventricle has  severely decreased function. The left ventricle demonstrates global  hypokinesis. Left ventricular  diastolic parameters are consistent with Grade III diastolic dysfunction  (restrictive). Elevated left ventricular end-diastolic pressure.   2. Right ventricular systolic function is severely reduced. The right  ventricular size is mildly enlarged. Tricuspid regurgitation signal is  inadequate for assessing PA pressure.   3. Left atrial size was mildly dilated.   4. Right atrial size was mild to moderately dilated.   5. The mitral valve is degenerative. Mild mitral valve regurgitation. No  evidence of mitral stenosis.   6. Tricuspid valve regurgitation is severe.   7. The aortic valve is tricuspid. Aortic valve regurgitation is not  visualized. Aortic valve sclerosis/calcification is present, without any  evidence of aortic stenosis. Aortic valve Vmax measures 1.41 m/s.   8. The inferior vena cava is normal in size with <50% respiratory  variability, suggesting right atrial pressure of 8 mmHg.   Laboratory Data:  High Sensitivity Troponin:    Recent Labs  Lab 08/21/23 1053  TROPONINIHS 3,389*     Chemistry Recent Labs  Lab 08/21/23 1053  NA 142  K 4.5  CL 111  CO2 19*  GLUCOSE 177*  BUN 26*  CREATININE 1.09*  CALCIUM 9.0  GFRNONAA 49*  ANIONGAP 12    Recent Labs  Lab  08/21/23 1053  PROT 7.2  ALBUMIN 3.4*  AST 40  ALT 14  ALKPHOS 78  BILITOT 1.3*   Lipids No results for input(s): "CHOL", "TRIG", "HDL", "LABVLDL", "LDLCALC", "CHOLHDL" in the last 168 hours.  Hematology Recent Labs  Lab 08/21/23 1053  WBC 7.6  RBC 4.37  HGB 13.3  HCT 41.0  MCV 93.8  MCH 30.4  MCHC 32.4  RDW 16.9*  PLT 313   Thyroid  No results for input(s): "TSH", "FREET4" in the last 168 hours.  BNPNo results for input(s): "BNP", "PROBNP" in the last 168 hours.  DDimer No results for input(s): "DDIMER" in the last 168 hours.   Radiology/Studies:  CT Head Wo Contrast Result Date: 08/21/2023 IMPRESSION: No evidence of acute intracranial or cervical spine injury. Electronically Signed   By: Ronnette Coke M.D.   On: 08/21/2023 12:13   CT Cervical Spine Wo Contrast Result Date: 08/21/2023 CIMPRESSION: No evidence of acute intracranial or cervical spine injury. Electronically Signed   By: Ronnette Coke M.D.   On: 08/21/2023 12:13   DG Chest 2 View Result Date: 08/21/2023 IMPRESSION: Infiltrates as described could correlate with pneumonia Electronically Signed   By: Fredrich Jefferson M.D.   On: 08/21/2023 11:45   Assessment and Plan:   Elevated troponin - Presented with weakness, daughter noted patient experienced fleeting left sided chest pain several days ago. EMS was called, but the pain had resolved prior to their arrival and patient refused transport to the ED.  - Prior echo 03/2023 with EF 20-25% - Troponin 1610>9604 - Patient is not a good candidate for catheterization in the setting of several comorbidities and frequent refusal of medical treatment - Continue IV heparin for 48 hours - Recommend conservative treatment  with ASA and clopidogrel  Acute on chronic HFrEF - Recent admission 03/2023 with echo showing EF 20 to 25% with global hypokinesis, elevated left ventricular end-diastolic pressure, G3 DD, mild MR and right atrial pressure of 8 mmHg - BNP 1549 - Volume status is difficult to assess on exam - Consider trial of IV Lasix  given elevated BNP, kidney function appears near baseline - Recommend resuming PTA medications including carvedilol , Farxiga , losartan , and spironolactone   Possible pneumonia - Noted on CXR - Antibiotics per IM  For questions or updates, please contact [facility] Please consult [email or web address] for contact info under    Signed, Brodie Cannon, PA-C  08/21/2023 12:39 PM

## 2023-08-21 NOTE — Progress Notes (Signed)
   08/21/23 1330  Spiritual Encounters  Type of Visit Initial  Care provided to: Pt and family  Conversation partners present during encounter Nurse  Referral source Chaplain assessment  Reason for visit Routine spiritual support  OnCall Visit No  Interventions  Spiritual Care Interventions Made Established relationship of care and support;Compassionate presence  Intervention Outcomes  Outcomes Connection to spiritual care;Awareness around self/spiritual resourses  Spiritual Care Plan  Spiritual Care Issues Still Outstanding No further spiritual care needs at this time (see row info)   Daughter, Gregary Lean, and granddaughter, Lynder Sanger, at bedside. Stopped and gave encouragement

## 2023-08-21 NOTE — Consult Note (Signed)
 PHARMACY - ANTICOAGULATION CONSULT NOTE  Pharmacy Consult for heparin infusion Indication: chest pain/ACS  Allergies  Allergen Reactions   Codeine Other (See Comments)    "I go crazy"   Etodolac     Other reaction(s): Unknown   Indomethacin     Other reaction(s): Unknown   Penicillins Other (See Comments)    Does not remember this allergy Has patient had a PCN reaction causing immediate rash, facial/tongue/throat swelling, SOB or lightheadedness with hypotension: Unknown Has patient had a PCN reaction causing severe rash involving mucus membranes or skin necrosis: Unknown Has patient had a PCN reaction that required hospitalization: Unknown Has patient had a PCN reaction occurring within the last 10 years: Unknown If all of the above answers are "NO", then may proceed with Cephalosporin use.    Tizanidine     Other reaction(s): Syncope Fell on the floor    Tramadol     Other reaction(s): Unknown    Patient Measurements: Height: 5\' 5"  (165.1 cm) Weight: 72.8 kg (160 lb 6.4 oz) IBW/kg (Calculated) : 57 HEPARIN DW (KG): 71.7  Vital Signs: Temp: 98 F (36.7 C) (05/12 2000) Temp Source: Oral (05/12 2000) BP: 103/78 (05/12 2000) Pulse Rate: 89 (05/12 2000)  Labs: Recent Labs    08/21/23 1053 08/21/23 1335 08/21/23 2126  HGB 13.3  --   --   HCT 41.0  --   --   PLT 313  --   --   APTT  --  >200*  --   LABPROT  --  19.4*  --   INR  --  1.6*  --   HEPARINUNFRC  --   --  0.25*  CREATININE 1.09*  --   --   TROPONINIHS 3,389* 3,193*  --     Estimated Creatinine Clearance: 37 mL/min (A) (by C-G formula based on SCr of 1.09 mg/dL (H)).   Medical History: Past Medical History:  Diagnosis Date   Cancer [facility])    right breast cancer   Diabetes mellitus without complication (HCC)    Hypertension    Stroke (HCC)     Medications:  No home anticoagulants per pharmacist review  Assessment: 88 yo female presented to the ED from independent living facility with  generalized weakness.  PMH includes anxiety, GERD, HLD, Pulm HTN, T2DM, CHF, and CVA.  Troponin found to be elevated.  Pharmacy consulted to initiate heparin infusion.  Baseline aPTT and PT/INR ordered  Date/Time HL Rate  Comment 5/12 2126 0.25 850 units/hr Subtherapeutic   Goal of Therapy:  Heparin level 0.3-0.7 units/ml Monitor platelets by anticoagulation protocol: Yes   Plan:  Heparin level subtherapeutic  Give 1050 units bolus x 1 Increase heparin infusion rate to 950 units/hr Check HL in 8 hours after rate change Monitor CBC daily while on heparin  Alice Innocent, PharmD 08/21/2023,10:17 PM

## 2023-08-21 NOTE — ED Notes (Signed)
 Drenda Gentle, MD, made aware of troponin 3,389

## 2023-08-22 DIAGNOSIS — I5020 Unspecified systolic (congestive) heart failure: Secondary | ICD-10-CM | POA: Insufficient documentation

## 2023-08-22 DIAGNOSIS — I214 Non-ST elevation (NSTEMI) myocardial infarction: Secondary | ICD-10-CM | POA: Diagnosis not present

## 2023-08-22 DIAGNOSIS — I5022 Chronic systolic (congestive) heart failure: Secondary | ICD-10-CM | POA: Diagnosis not present

## 2023-08-22 LAB — HEPARIN LEVEL (UNFRACTIONATED)
Heparin Unfractionated: 0.46 [IU]/mL (ref 0.30–0.70)
Heparin Unfractionated: 0.41 [IU]/mL (ref 0.30–0.70)

## 2023-08-22 LAB — CBC
HCT: 39.5 % (ref 36.0–46.0)
Hemoglobin: 13.2 g/dL (ref 12.0–15.0)
MCH: 30.4 pg (ref 26.0–34.0)
MCHC: 33.4 g/dL (ref 30.0–36.0)
MCV: 91 fL (ref 80.0–100.0)
Platelets: 299 10*3/uL (ref 150–400)
RBC: 4.34 MIL/uL (ref 3.87–5.11)
RDW: 16.5 % — ABNORMAL HIGH (ref 11.5–15.5)
WBC: 6.5 10*3/uL (ref 4.0–10.5)
nRBC: 0 % (ref 0.0–0.2)

## 2023-08-22 LAB — BASIC METABOLIC PANEL WITH GFR
Anion gap: 10 (ref 5–15)
BUN: 24 mg/dL — ABNORMAL HIGH (ref 8–23)
CO2: 20 mmol/L — ABNORMAL LOW (ref 22–32)
Calcium: 8.8 mg/dL — ABNORMAL LOW (ref 8.9–10.3)
Chloride: 109 mmol/L (ref 98–111)
Creatinine, Ser: 0.84 mg/dL (ref 0.44–1.00)
GFR, Estimated: >60 mL/min
Glucose, Bld: 162 mg/dL — ABNORMAL HIGH (ref 70–99)
Potassium: 4.1 mmol/L (ref 3.5–5.1)
Sodium: 139 mmol/L (ref 135–145)

## 2023-08-22 LAB — GLUCOSE, CAPILLARY
Glucose-Capillary: 161 mg/dL — ABNORMAL HIGH (ref 70–99)
Glucose-Capillary: 179 mg/dL — ABNORMAL HIGH (ref 70–99)
Glucose-Capillary: 149 mg/dL — ABNORMAL HIGH (ref 70–99)
Glucose-Capillary: 151 mg/dL — ABNORMAL HIGH (ref 70–99)

## 2023-08-22 LAB — PROCALCITONIN: Procalcitonin: <0.1 ng/mL

## 2023-08-22 MED ORDER — SPIRONOLACTONE 12.5 MG HALF TABLET
12.5000 mg | ORAL_TABLET | Freq: Every day | ORAL | Status: DC
  Administered 2023-08-22 – 2023-08-31 (×10): 12.5 mg via ORAL
  Filled 2023-08-22 (×10): qty 1

## 2023-08-22 MED ORDER — METOPROLOL SUCCINATE ER 25 MG PO TB24
12.5000 mg | ORAL_TABLET | Freq: Every day | ORAL | Status: DC
  Administered 2023-08-23 – 2023-08-30 (×8): 12.5 mg via ORAL
  Filled 2023-08-22 (×9): qty 1

## 2023-08-22 NOTE — Progress Notes (Addendum)
 PROGRESS NOTE    [name] [name]  ZHY:[id number] DOB: 1936-02-02 DOA: 08/21/2023 PCP: Melchor Spoon, MD     Brief Narrative:   From admission h and p  [name] [name] is a 88 y.o. female with medical history significant for HFrEF, severe TR, type 2 diabetes, dementia, history of CVA, and hyperlipidemia who presents to the ED via EMS for evaluation of generalized weakness. Patient only oriented to self and person so history obtained from daughter. Per daughter, patient has been weak since her last hospitalization but then after completed 3 weeks of rehab. Last Friday, she started having left-sided chest pain so EMS was called. An EKG was done and patient was given nitro resolved, to the ED for further evaluation but patient declined.  Patient has continued to week and lethargic and occasionally agitated at night. She has had 2-3 falls in the past month due to significant weakness. Patient has had mild shortness of breath but no fevers, chills, dysuria, cough, diarrhea or constipation.   Assessment & Plan:   Principal Problem:   NSTEMI (non-ST elevated myocardial infarction) (HCC) Active Problems:   Diabetes (HCC)   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)   Essential hypertension   PAD (peripheral artery disease) (HCC)   History of CVA (cerebrovascular accident)   Pneumonia due to infectious organism   Generalized weakness   Pulmonary hypertension, moderate to severe (HCC)   HFrEF (heart failure with reduced ejection fraction) (HCC)  # NSTEMI Asymptomatic currently, troponin peak of 3400. Cardiology advises medical mgmt and patient has declined cath. - continue heparin - dapt, coreg , statin  # HFrEF Biventricular failure, EF 25%, severe MR, pHTN. Has not followed up with advanced heart failure. Does not appear to be exacerbated. Prognosis poor - continue coreg , losartan , jardiance, spironolactone  - palliative consult  # Pulmonary infiltrate No clinical signs/symptoms  pneumonia - hold abx  # Dementia No behavioral disturbance. Resides at ALF  # Debility - PT/OT  # T2DM Glucose controlled - continue ssi - d/c metformin, given advanced systolic heart failure would be hesitant to resume that at discharge  # Hx CVA Nothing acute on CT of head/neck, appears to be at baseline - meds as above  # HTN Bp appropriate - continue current regimen   DVT prophylaxis: iv heparin Code Status: dnr/dni Family Communication: none at bedside, no answer when daughter [name] [name] telephoned  Level of care: Telemetry Cardiac Status is: Inpatient Remains inpatient appropriate because: severity of illness    Consultants:  cardiology  Procedures: none  Antimicrobials:  none    Subjective: Reports feeling fine, no cough or chest pain  Objective: Vitals:   08/21/23 2000 08/22/23 0043 08/22/23 0400 08/22/23 0757  BP: 103/78 121/84 119/84 (!) 124/90  Pulse: 89 75 87 85  Resp:  20 16 19   Temp: 98 F (36.7 C) (!) 97.4 F (36.3 C) 98 F (36.7 C) 98.6 F (37 C)  TempSrc: Oral  Oral   SpO2: 100% 100% 100% 99%  Weight:      Height:        Intake/Output Summary (Last 24 hours) at 08/22/2023 2952 Last data filed at 08/21/2023 1637 Gross per 24 hour  Intake 68.95 ml  Output --  Net 68.95 ml   Filed Weights   08/21/23 1054  Weight: 72.8 kg    Examination:  General exam: Appears calm and comfortable  Respiratory system: Clear to auscultation save for rales at bases Cardiovascular system: S1 & S2 heard, distant sounds,  murmur present Gastrointestinal system: Abdomen is nondistended, soft and nontender.  Central nervous system: Alert and oriented to self and place, moving all 4 Extremities: Symmetric 5 x 5 power. Skin: No rashes, lesions or ulcers Psychiatry: calm, mild confusion    Data Reviewed: I have personally reviewed following labs and imaging studies  CBC: Recent Labs  Lab 08/21/23 1053 08/22/23 0524  WBC 7.6 6.5  NEUTROABS 6.1   --   HGB 13.3 13.2  HCT 41.0 39.5  MCV 93.8 91.0  PLT 313 299   Basic Metabolic Panel: Recent Labs  Lab 08/21/23 1053 08/22/23 0524  NA 142 139  K 4.5 4.1  CL 111 109  CO2 19* 20*  GLUCOSE 177* 162*  BUN 26* 24*  CREATININE 1.09* 0.84  CALCIUM 9.0 8.8*   GFR: Estimated Creatinine Clearance: 48 mL/min (by C-G formula based on SCr of 0.84 mg/dL). Liver Function Tests: Recent Labs  Lab 08/21/23 1053  AST 40  ALT 14  ALKPHOS 78  BILITOT 1.3*  PROT 7.2  ALBUMIN 3.4*   No results for input(s): "LIPASE", "AMYLASE" in the last 168 hours. No results for input(s): "AMMONIA" in the last 168 hours. Coagulation Profile: Recent Labs  Lab 08/21/23 1335  INR 1.6*   Cardiac Enzymes: No results for input(s): "CKTOTAL", "CKMB", "CKMBINDEX", "TROPONINI" in the last 168 hours. BNP (last 3 results) No results for input(s): "PROBNP" in the last 8760 hours. HbA1C: No results for input(s): "HGBA1C" in the last 72 hours. CBG: Recent Labs  Lab 08/21/23 1751 08/21/23 2239 08/22/23 0754  GLUCAP 155* 155* 161*   Lipid Profile: No results for input(s): "CHOL", "HDL", "LDLCALC", "TRIG", "CHOLHDL", "LDLDIRECT" in the last 72 hours. Thyroid  Function Tests: No results for input(s): "TSH", "T4TOTAL", "FREET4", "T3FREE", "THYROIDAB" in the last 72 hours. Anemia Panel: No results for input(s): "VITAMINB12", "FOLATE", "FERRITIN", "TIBC", "IRON", "RETICCTPCT" in the last 72 hours. Urine analysis:    Component Value Date/Time   COLORURINE AMBER (A) 07/14/2023 1306   APPEARANCEUR HAZY (A) 07/14/2023 1306   APPEARANCEUR Clear 03/12/2014 1000   LABSPEC 1.030 07/14/2023 1306   LABSPEC 1.018 03/12/2014 1000   PHURINE 5.0 07/14/2023 1306   GLUCOSEU NEGATIVE 07/14/2023 1306   GLUCOSEU Negative 03/12/2014 1000   HGBUR NEGATIVE 07/14/2023 1306   BILIRUBINUR NEGATIVE 07/14/2023 1306   BILIRUBINUR Negative 03/12/2014 1000   KETONESUR NEGATIVE 07/14/2023 1306   PROTEINUR >=300 (A) 07/14/2023  1306   NITRITE NEGATIVE 07/14/2023 1306   LEUKOCYTESUR TRACE (A) 07/14/2023 1306   LEUKOCYTESUR Trace 03/12/2014 1000   Sepsis Labs: @LABRCNTIP (procalcitonin:4,lacticidven:4)  )No results found for this or any previous visit (from the past 240 hours).       Radiology Studies: CT Head Wo Contrast Result Date: 08/21/2023 CLINICAL DATA:  Neck trauma EXAM: CT HEAD WITHOUT CONTRAST CT CERVICAL SPINE WITHOUT CONTRAST TECHNIQUE: Multidetector CT imaging of the head and cervical spine was performed following the standard protocol without intravenous contrast. Multiplanar CT image reconstructions of the cervical spine were also generated. RADIATION DOSE REDUCTION: This exam was performed according to the departmental dose-optimization program which includes automated exposure control, adjustment of the mA and/or kV according to patient size and/or use of iterative reconstruction technique. COMPARISON:  07/14/2023 FINDINGS: CT HEAD FINDINGS Brain: No evidence of acute infarction, hemorrhage, hydrocephalus, extra-axial collection or mass lesion/mass effect. Pronounced generalized brain atrophy. Chronic small vessel ischemia in the deep cerebral white matter. Encephalomalacia in the cerebellum, usually post ischemic although centered in the midline. Vascular: No hyperdense vessel or  unexpected calcification. Skull: Normal. Negative for fracture or focal lesion. Sinuses/Orbits: Negative CT CERVICAL SPINE FINDINGS Alignment: Normal. Skull base and vertebrae: No acute fracture. No primary bone lesion or focal pathologic process. Soft tissues and spinal canal: No prevertebral fluid or swelling. No visible canal hematoma. Disc levels:  Mild degenerative spurring. Upper chest: Small layering pleural effusions IMPRESSION: No evidence of acute intracranial or cervical spine injury. Electronically Signed   By: Ronnette Coke M.D.   On: 08/21/2023 12:13   CT Cervical Spine Wo Contrast Result Date: 08/21/2023 CLINICAL  DATA:  Neck trauma EXAM: CT HEAD WITHOUT CONTRAST CT CERVICAL SPINE WITHOUT CONTRAST TECHNIQUE: Multidetector CT imaging of the head and cervical spine was performed following the standard protocol without intravenous contrast. Multiplanar CT image reconstructions of the cervical spine were also generated. RADIATION DOSE REDUCTION: This exam was performed according to the departmental dose-optimization program which includes automated exposure control, adjustment of the mA and/or kV according to patient size and/or use of iterative reconstruction technique. COMPARISON:  07/14/2023 FINDINGS: CT HEAD FINDINGS Brain: No evidence of acute infarction, hemorrhage, hydrocephalus, extra-axial collection or mass lesion/mass effect. Pronounced generalized brain atrophy. Chronic small vessel ischemia in the deep cerebral white matter. Encephalomalacia in the cerebellum, usually post ischemic although centered in the midline. Vascular: No hyperdense vessel or unexpected calcification. Skull: Normal. Negative for fracture or focal lesion. Sinuses/Orbits: Negative CT CERVICAL SPINE FINDINGS Alignment: Normal. Skull base and vertebrae: No acute fracture. No primary bone lesion or focal pathologic process. Soft tissues and spinal canal: No prevertebral fluid or swelling. No visible canal hematoma. Disc levels:  Mild degenerative spurring. Upper chest: Small layering pleural effusions IMPRESSION: No evidence of acute intracranial or cervical spine injury. Electronically Signed   By: Ronnette Coke M.D.   On: 08/21/2023 12:13   DG Chest 2 View Result Date: 08/21/2023 CLINICAL DATA:  Weakness EXAM: CHEST - 2 VIEW COMPARISON:  July 09, 2023 FINDINGS: Bilateral pulmonary infiltrates with consolidative changes of the left lower lobe may correlate with pneumonia. Superimposed congestive changes?. Heart and mediastinum upper limits of normal IMPRESSION: Infiltrates as described could correlate with pneumonia Electronically Signed    By: Fredrich Jefferson M.D.   On: 08/21/2023 11:45        Scheduled Meds:  aspirin  EC  81 mg Oral Daily   atorvastatin  40 mg Oral Daily   carvedilol   3.125 mg Oral BID WC   clopidogrel  75 mg Oral Daily   dapagliflozin  propanediol  10 mg Oral Daily   insulin  aspart  0-15 Units Subcutaneous TID WC   insulin  aspart  0-5 Units Subcutaneous QHS   losartan   12.5 mg Oral Daily   memantine   20 mg Oral QPM   metFORMIN  1,000 mg Oral BID WC   spironolactone   12.5 mg Oral Daily   Continuous Infusions:  heparin 950 Units/hr (08/21/23 2242)     LOS: 1 day     Raymonde Calico, MD [facility]   If 7PM-7AM, please contact night-coverage [email or web address] Password [email or web address] 08/22/2023, 9:09 AM

## 2023-08-22 NOTE — Consult Note (Signed)
 PHARMACY - ANTICOAGULATION CONSULT NOTE  Pharmacy Consult for heparin infusion Indication: chest pain/ACS  Patient Measurements: Height: 5\' 5"  (165.1 cm) Weight: 72.8 kg (160 lb 6.4 oz) IBW/kg (Calculated) : 57 HEPARIN DW (KG): 71.7  Vital Signs: Temp: 98.6 F (37 C) (05/13 0757) Temp Source: Oral (05/13 0400) BP: 124/90 (05/13 0757) Pulse Rate: 85 (05/13 0757)  Labs: Recent Labs    08/21/23 1053 08/21/23 1335 08/21/23 2126 08/22/23 0524 08/22/23 0643  HGB 13.3  --   --  13.2  --   HCT 41.0  --   --  39.5  --   PLT 313  --   --  299  --   APTT  --  >200*  --   --   --   LABPROT  --  19.4*  --   --   --   INR  --  1.6*  --   --   --   HEPARINUNFRC  --   --  0.25*  --  0.46  CREATININE 1.09*  --   --  0.84  --   TROPONINIHS 3,389* 3,193*  --   --   --    Estimated Creatinine Clearance: 48 mL/min (by C-G formula based on SCr of 0.84 mg/dL).  Medical History: Past Medical History:  Diagnosis Date   Cancer [facility])    right breast cancer   Diabetes mellitus without complication (HCC)    Hypertension    Stroke (HCC)    Medications:  No home anticoagulants per pharmacist review  Assessment: 88 yo female presented to the ED from independent living facility with generalized weakness.  PMH includes anxiety, GERD, HLD, Pulm HTN, T2DM, CHF, and CVA.  Troponin found to be elevated.  Pharmacy consulted to initiate heparin infusion.  Baseline aPTT and PT/INR ordered  Date Time HL Rate  Comment 5/12  2126 0.25 850 units/hr Subtherapeutic  5/13 0643 0.46 950 units/hr Therapeutic x 1, hgb and plt stable  Goal of Therapy:  Heparin level 0.3-0.7 units/ml Monitor platelets by anticoagulation protocol: Yes   Plan:  Continue heparin infusion at 950 units/hr Check HL in 8 hours to confirm therapeutic rate Monitor CBC daily while on heparin  Nesha Counihan Rodriguez-Guzman PharmD, BCPS 08/22/2023 9:02 AM

## 2023-08-22 NOTE — Progress Notes (Signed)
 Rounding Note    Patient Name: [name] [name] Date of Encounter: 08/22/2023  [facility] Cardiologist: None   Subjective   Patient reports feeling better today. She denies chest pain and shortness of breath. She remains on 2 L supplemental O2.   Inpatient Medications    Scheduled Meds:  aspirin  EC  81 mg Oral Daily   atorvastatin  40 mg Oral Daily   carvedilol   3.125 mg Oral BID WC   clopidogrel  75 mg Oral Daily   dapagliflozin  propanediol  10 mg Oral Daily   doxycycline  100 mg Oral Q12H   insulin  aspart  0-15 Units Subcutaneous TID WC   insulin  aspart  0-5 Units Subcutaneous QHS   losartan   12.5 mg Oral Daily   memantine   20 mg Oral QPM   metFORMIN  1,000 mg Oral BID WC   Continuous Infusions:  cefTRIAXone (ROCEPHIN)  IV     heparin 950 Units/hr (08/21/23 2242)   PRN Meds: acetaminophen  **OR** acetaminophen , colchicine , loperamide, ondansetron  **OR** ondansetron  (ZOFRAN ) IV, risperiDONE, senna-docusate   Vital Signs    Vitals:   08/21/23 2000 08/22/23 0043 08/22/23 0400 08/22/23 0757  BP: 103/78 121/84 119/84 (!) 124/90  Pulse: 89 75 87 85  Resp:  20 16 19   Temp: 98 F (36.7 C) (!) 97.4 F (36.3 C) 98 F (36.7 C) 98.6 F (37 C)  TempSrc: Oral  Oral   SpO2: 100% 100% 100% 99%  Weight:      Height:        Intake/Output Summary (Last 24 hours) at 08/22/2023 0826 Last data filed at 08/21/2023 1637 Gross per 24 hour  Intake 68.95 ml  Output --  Net 68.95 ml      08/21/2023   10:54 AM 08/10/2023   11:40 AM 07/19/2023    2:54 AM  Last 3 Weights  Weight (lbs) 160 lb 6.4 oz 161 lb 159 lb 13.3 oz  Weight (kg) 72.757 kg 73.029 kg 72.5 kg      Telemetry    Sinus rhythm with one short run of SVT and NSVT 6 beats - Personally Reviewed  Physical Exam   GEN: No acute distress.   Neck: No JVD Cardiac: RRR, no murmurs, rubs, or gallops.  Respiratory: Diminished at the bases GI: Soft, nontender, non-distended  MS: No edema; No deformity. Neuro:   Nonfocal  Psych: Normal affect   Labs    High Sensitivity Troponin:   Recent Labs  Lab 08/21/23 1053 08/21/23 1335  TROPONINIHS 3,389* 3,193*     Chemistry Recent Labs  Lab 08/21/23 1053 08/22/23 0524  NA 142 139  K 4.5 4.1  CL 111 109  CO2 19* 20*  GLUCOSE 177* 162*  BUN 26* 24*  CREATININE 1.09* 0.84  CALCIUM 9.0 8.8*  PROT 7.2  --   ALBUMIN 3.4*  --   AST 40  --   ALT 14  --   ALKPHOS 78  --   BILITOT 1.3*  --   GFRNONAA 49* >60  ANIONGAP 12 10    Lipids No results for input(s): "CHOL", "TRIG", "HDL", "LABVLDL", "LDLCALC", "CHOLHDL" in the last 168 hours.  Hematology Recent Labs  Lab 08/21/23 1053 08/22/23 0524  WBC 7.6 6.5  RBC 4.37 4.34  HGB 13.3 13.2  HCT 41.0 39.5  MCV 93.8 91.0  MCH 30.4 30.4  MCHC 32.4 33.4  RDW 16.9* 16.5*  PLT 313 299   Thyroid  No results for input(s): "TSH", "FREET4" in the last 168  hours.  BNP Recent Labs  Lab 08/21/23 1053  BNP 1,549.7*    DDimer No results for input(s): "DDIMER" in the last 168 hours.   Radiology    CT Head Wo Contrast Result Date: 08/21/2023 IMPRESSION: No evidence of acute intracranial or cervical spine injury. Electronically Signed   By: Ronnette Coke M.D.   On: 08/21/2023 12:13   CT Cervical Spine Wo Contrast Result Date: 08/21/2023 IMPRESSION: No evidence of acute intracranial or cervical spine injury. Electronically Signed   By: Ronnette Coke M.D.   On: 08/21/2023 12:13   DG Chest 2 View Result Date: 08/21/2023 IMPRESSION: Infiltrates as described could correlate with pneumonia Electronically Signed   By: Fredrich Jefferson M.D.   On: 08/21/2023 11:45   Cardiac Studies   04/01/2023 Echo complete 1. Left ventricular ejection fraction, by estimation, is 20 to 25%. Left  ventricular ejection fraction by 3D volume is 21 %. The left ventricle has  severely decreased function. The left ventricle demonstrates global  hypokinesis. Left ventricular  diastolic parameters are consistent with Grade  III diastolic dysfunction  (restrictive). Elevated left ventricular end-diastolic pressure.   2. Right ventricular systolic function is severely reduced. The right  ventricular size is mildly enlarged. Tricuspid regurgitation signal is  inadequate for assessing PA pressure.   3. Left atrial size was mildly dilated.   4. Right atrial size was mild to moderately dilated.   5. The mitral valve is degenerative. Mild mitral valve regurgitation. No  evidence of mitral stenosis.   6. Tricuspid valve regurgitation is severe.   7. The aortic valve is tricuspid. Aortic valve regurgitation is not  visualized. Aortic valve sclerosis/calcification is present, without any  evidence of aortic stenosis. Aortic valve Vmax measures 1.41 m/s.   8. The inferior vena cava is normal in size with <50% respiratory  variability, suggesting right atrial pressure of 8 mmHg.   Patient Profile     88 y.o. female with a history of HFrEF, severe TR, T2DM, dementia, history of CVA, and hyperlipidemia, admitted 5/12 for weakness. On admission found to have NSTEMI with troponin 3000. CXR also showed possible pneumonia.   Assessment & Plan    NSTEMI - Presented for weakness, daughter noted patient experienced fleeting left sided chest pain several days ago. EMS was called, but the pain had resolved prior to their arrival and patient refused transport to the ED.  - Prior CT chest showing extensive coronary artery calcifications - Prior echo 03/2023 with EF 20-25% - Troponin 1610>9604 - Patient is not a good candidate for invasive procedures due to several comorbidities - Continue conservative treatment with IV heparin for 48 hours - Continue DAPT with ASA and clopidogrel for 1 year if tolerated - Continue atorvastatin 40 mg daily - No plan for further ischemic evaluation at this time  HFrEF - Recent admission 03/2023 with echo showing EF 20 to 25% with global hypokinesis, elevated left ventricular end-diastolic  pressure, G3 DD, mild MR and right atrial pressure of 8 mmHg - BNP 1549 - Does not appear grossly volume overloaded on exam - Continue carvedilol  3.125, Farxiga  10 mg daily, and losartan  12.5 mg daily - BP and kidney function stable - Resume PTA spironolactone  12.5 mg daily - Patient typically refuses Lasix  at home, consider Lasix  20 mg PRN at discharge  Possible pneumonia - Noted on CXR - Antibiotics per IM  For questions or updates, please contact [facility] Please consult [email or web address] for contact info under  Signed, Brodie Cannon, PA-C  08/22/2023, 8:26 AM

## 2023-08-22 NOTE — Progress Notes (Signed)
  Chaplain On-Call responded to a page from [employer] who reported patient request for discussion about Advance Directives.  Chaplain obtained AD packet from Unit Secretary and attempted to visit the patient. However, the patient is receiving bedside care from the medical team at this time.  Chaplain will refer to the Oncoming Chaplain at 1630 hours for follow up.  Chaplain Dean Every., [facility]

## 2023-08-22 NOTE — Evaluation (Signed)
 Physical Therapy Evaluation Patient Details Name: [name] [name] MRN: [id number] DOB: October 26, 1935 Today's Date: 08/22/2023  History of Present Illness  Pt is an 88 y.o. female presenting to hospital 08/21/23 with c/o generalized weakness x4 weeks.  Hospitalization April for CHF exacerbation and generalized weakness; c/o L foot pain at that time--(-) fx via x-ray.  Pt admitted with NSTEMI, HFrEF, possible community acquired PNA.  PMH includes htn, h/o stroke, DM type 2, chronic systolic CHF, dementia, R breast CA, s/p R lumpectomy, B knee sx.  Clinical Impression  Prior to recent medical concerns, pt reports being ambulatory with rollator; lives at [facility]; pt reporting 3 falls in past month d/t weakness.  Currently pt is SBA semi-supine to sitting EOB; CGA to stand from bed up to RW; min assist to ambulate a few feet bed to recliner with RW use (pt reporting she could not walk any further and needed to sit); min assist to control descent sitting in recliner.  SpO2 sats 98% or greater on room air during session.  Pt would currently benefit from skilled PT to address noted impairments and functional limitations (see below for any additional details).  Upon hospital discharge, pt would benefit from ongoing therapy.     If plan is discharge home, recommend the following: A little help with walking and/or transfers;A little help with bathing/dressing/bathroom;Assistance with cooking/housework;Assist for transportation;Help with stairs or ramp for entrance   Can travel by private vehicle        Equipment Recommendations Wheelchair (measurements PT);Wheelchair cushion (measurements PT)  Recommendations for Other Services       Functional Status Assessment Patient has had a recent decline in their functional status and demonstrates the ability to make significant improvements in function in a reasonable and predictable amount of time.     Precautions / Restrictions  Precautions Precautions: Fall Recall of Precautions/Restrictions: Impaired Restrictions Weight Bearing Restrictions Per Provider Order: No      Mobility  Bed Mobility Overal bed mobility: Needs Assistance Bed Mobility: Supine to Sit     Supine to sit: Supervision, HOB elevated     General bed mobility comments: mild increased effort/time to perform on own    Transfers Overall transfer level: Needs assistance Equipment used: Rolling walker (2 wheels) Transfers: Sit to/from Stand Sit to Stand: Contact guard assist, Min assist           General transfer comment: increased effort/time to stand up to RW; vc's for UE placement; min assist to control descent sitting    Ambulation/Gait Ambulation/Gait assistance: Min assist Gait Distance (Feet): 3 Feet (bed to recliner) Assistive device: Rolling walker (2 wheels)   Gait velocity: decreased     General Gait Details: decreased B LE step length/foot clearance; limited d/t pt fatigue/generalized weakness  Stairs            Wheelchair Mobility     Tilt Bed    Modified Rankin (Stroke Patients Only)       Balance Overall balance assessment: Needs assistance Sitting-balance support: No upper extremity supported, Feet supported Sitting balance-Leahy Scale: Good Sitting balance - Comments: steady reaching within BOS   Standing balance support: Bilateral upper extremity supported, Reliant on assistive device for balance Standing balance-Leahy Scale: Fair Standing balance comment: steady static standing with B UE support on RW                             Pertinent Vitals/Pain Pain Assessment  Pain Assessment: No/denies pain HR 73-80 bpm during sessions activities.    Home Living Family/patient expects to be discharged to:: Private residence Living Arrangements: Alone   Type of Home: Independent living facility           Home Equipment: Rollator (4 wheels);Shower seat Additional Comments:  [facility].  3rd floor apt (elevator access).    Prior Function Prior Level of Function : Independent/Modified Independent             Mobility Comments: Modified independent ambulating with rollator.  Pt reports 3 falls in last month.       Extremity/Trunk Assessment   Upper Extremity Assessment Upper Extremity Assessment: Defer to OT evaluation    Lower Extremity Assessment Lower Extremity Assessment: Generalized weakness       Communication   Communication Communication: No apparent difficulties    Cognition Arousal: Alert Behavior During Therapy: Flat affect   PT - Cognitive impairments: History of cognitive impairments                       PT - Cognition Comments: Oriented to name, DOB, hospital, general situation, and month/year. Following commands: Impaired Following commands impaired: Follows one step commands with increased time     Cueing Cueing Techniques: Verbal cues, Visual cues, Tactile cues     General Comments  Nursing cleared pt for participation in physical therapy.  Pt agreeable to PT session.    Exercises     Assessment/Plan    PT Assessment Patient needs continued PT services  PT Problem List Decreased strength;Decreased activity tolerance;Decreased balance;Decreased mobility       PT Treatment Interventions DME instruction;Gait training;Functional mobility training;Therapeutic activities;Therapeutic exercise;Balance training;Patient/family education    PT Goals (Current goals can be found in the Care Plan section)  Acute Rehab PT Goals Patient Stated Goal: to improve strength and balance PT Goal Formulation: With patient Time For Goal Achievement: 09/05/23 Potential to Achieve Goals: Fair    Frequency Min 2X/week     Co-evaluation               AM-PAC PT "6 Clicks" Mobility  Outcome Measure Help needed turning from your back to your side while in a flat bed without using bedrails?:  None Help needed moving from lying on your back to sitting on the side of a flat bed without using bedrails?: A Little Help needed moving to and from a bed to a chair (including a wheelchair)?: A Little Help needed standing up from a chair using your arms (e.g., wheelchair or bedside chair)?: A Little Help needed to walk in hospital room?: A Little Help needed climbing 3-5 steps with a railing? : A Lot 6 Click Score: 18    End of Session Equipment Utilized During Treatment: Gait belt Activity Tolerance: Patient limited by fatigue Patient left: in chair;with call bell/phone within reach;with chair alarm set Nurse Communication: Mobility status;Precautions;Other (comment) (Pt's SpO2 sats during session) PT Visit Diagnosis: Other abnormalities of gait and mobility (R26.89);Muscle weakness (generalized) (M62.81);History of falling (Z91.81)    Time: 2956-2130 PT Time Calculation (min) (ACUTE ONLY): 27 min   Charges:   PT Evaluation $PT Eval Low Complexity: 1 Low PT Treatments $Therapeutic Activity: 8-22 mins PT General Charges $$ ACUTE PT VISIT: 1 Visit        Amador Junes, PT 08/22/23, 10:20 AM

## 2023-08-22 NOTE — Consult Note (Signed)
 PHARMACY - ANTICOAGULATION CONSULT NOTE  Pharmacy Consult for heparin infusion Indication: chest pain/ACS  Patient Measurements: Height: 5\' 5"  (165.1 cm) Weight: 72.8 kg (160 lb 6.4 oz) IBW/kg (Calculated) : 57 HEPARIN DW (KG): 71.7  Vital Signs: Temp: 98.9 F (37.2 C) (05/13 1116) Temp Source: Oral (05/13 0400) BP: 91/69 (05/13 1116) Pulse Rate: 72 (05/13 1116)  Labs: Recent Labs    08/21/23 1053 08/21/23 1335 08/21/23 2126 08/22/23 0524 08/22/23 0643 08/22/23 1458  HGB 13.3  --   --  13.2  --   --   HCT 41.0  --   --  39.5  --   --   PLT 313  --   --  299  --   --   APTT  --  >200*  --   --   --   --   LABPROT  --  19.4*  --   --   --   --   INR  --  1.6*  --   --   --   --   HEPARINUNFRC  --   --  0.25*  --  0.46 0.41  CREATININE 1.09*  --   --  0.84  --   --   TROPONINIHS 3,389* 3,193*  --   --   --   --    Estimated Creatinine Clearance: 48 mL/min (by C-G formula based on SCr of 0.84 mg/dL).  Medical History: Past Medical History:  Diagnosis Date   Cancer [facility])    right breast cancer   Diabetes mellitus without complication (HCC)    Hypertension    Stroke (HCC)    Medications:  No home anticoagulants per pharmacist review  Assessment: 88 yo female presented to the ED from independent living facility with generalized weakness.  PMH includes anxiety, GERD, HLD, Pulm HTN, T2DM, CHF, and CVA.  Troponin found to be elevated.  Pharmacy consulted to initiate heparin infusion.  Baseline aPTT and PT/INR ordered  Date Time HL Rate  Comment 5/12  2126 0.25 850 units/hr Subtherapeutic  5/13 0643 0.46 950 units/hr Therapeutic x 1, hgb and plt stable 5/13 1458 0.41 950 units/hr Therapeutic x 2  Goal of Therapy:  Heparin level 0.3-0.7 units/ml Monitor platelets by anticoagulation protocol: Yes   Plan:  Continue heparin infusion at 950 units/hr Check HL and CBC with AM labs  Tarick Parenteau Rodriguez-Guzman PharmD, BCPS 08/22/2023 3:31 PM

## 2023-08-22 NOTE — TOC Initial Note (Signed)
 Transition of Care [facility]) - Initial/Assessment Note    Patient Details  Name: [name] [name] MRN: [id number] Date of Birth: [birth year]  [facility]) CM/SW Contact:    [name] Bennett, LCSW Phone Number: 08/22/2023, 2:20 PM  Clinical Narrative:  Readmission prevention screen complete. CSW met with patient no family at bedside. CSW introduced role and explained that discharge planning would be discussed. PCP is [name] Goodell, MD. Daughter drives her to appointments. [facility] [place]. No issues obtaining medications. Patient lives at [facility]. She is active with [facility] for PT, OT, RN. She has a rollator and shower chair at home. CSW met with patient prior to therapy notes being entered recommending SNF. According to Patient [name] [name], patient went to [facility] 4/9-4/14 (5 days) and [facility] 4/14-4/26 (12 days). Will need to determine if patient will meet 3-night inpatient stay prior to starting workup for another SNF stay. Patient does have secondary insurance to cover copays once they start.                Expected Discharge Plan:  (TBD) Barriers to Discharge: Continued Medical Work up   Patient Goals and CMS Choice            Expected Discharge Plan and Services     Post Acute Care Choice:  (TBD) Living arrangements for the past 2 months: Independent Living Facility                           HH Arranged: RN, PT, OT [facility] Agency: [facility] Date HH Agency Contacted: 08/22/23   Representative spoke with at [facility] Agency: [name] [name]  Prior Living Arrangements/Services Living arrangements for the past 2 months: Independent Living Facility Lives with:: Self Patient language and need for interpreter reviewed:: Yes Do you feel safe going back to the place where you live?: Yes      Need for Family Participation in Patient Care: Yes (Comment)   Current home services: DME, Home PT, Home RN, Home OT Criminal  Activity/Legal Involvement Pertinent to Current Situation/Hospitalization: No - Comment as needed  Activities of Daily Living   ADL Screening (condition at time of admission) Independently performs ADLs?: No Does the patient have a NEW difficulty with bathing/dressing/toileting/self-feeding that is expected to last >3 days?: Yes (Initiates electronic notice to provider for possible OT consult) Does the patient have a NEW difficulty with getting in/out of bed, walking, or climbing stairs that is expected to last >3 days?: Yes (Initiates electronic notice to provider for possible PT consult) Does the patient have a NEW difficulty with communication that is expected to last >3 days?: No Is the patient deaf or have difficulty hearing?: No Does the patient have difficulty seeing, even when wearing glasses/contacts?: No Does the patient have difficulty concentrating, remembering, or making decisions?: Yes  Permission Sought/Granted                  Emotional Assessment Appearance:: Appears stated age Attitude/Demeanor/Rapport: Engaged, Gracious Affect (typically observed): Accepting, Appropriate, Calm, Pleasant Orientation: : Oriented to Self, Oriented to Place, Oriented to  Time, Oriented to Situation Alcohol / Substance Use: Not Applicable Psych Involvement: No (comment)  Admission diagnosis:  Weakness [R53.1] NSTEMI (non-ST elevated myocardial infarction) (HCC) [I21.4] Pneumonia due to infectious organism, unspecified laterality, unspecified part of lung [J18.9] Patient Active Problem List   Diagnosis Date Noted   HFrEF (heart failure with  reduced ejection fraction) (HCC) 08/22/2023   NSTEMI (non-ST elevated myocardial infarction) (HCC) 08/21/2023   Pneumonia due to infectious organism 08/21/2023   Generalized weakness 08/21/2023   Hypomagnesemia 07/15/2023   NSVT (nonsustained ventricular tachycardia) (HCC) 07/15/2023   Acute on chronic systolic (congestive) heart failure (HCC)  07/14/2023   Pulmonary hypertension, moderate to severe (HCC) 07/06/2023   Bilateral lower extremity edema 04/01/2023   Lightheadedness 04/01/2023   RVF (right ventricular failure) (HCC) 04/01/2023   Acute on chronic systolic CHF (congestive heart failure) (HCC) 03/31/2023   Acute on chronic systolic heart failure (HCC) 03/31/2023   History of CVA (cerebrovascular accident) 03/31/2023   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) 07/06/2020   Troponin level elevated 02/03/2018   Diabetes (HCC) 09/12/2017   Hyperlipidemia 09/12/2017   PAD (peripheral artery disease) (HCC) 06/09/2017   Essential hypertension 06/09/2014   H/O: duodenal ulcer 06/13/2013   Personal history of breast cancer 06/13/2013   PCP:  Melchor Spoon, MD [facility] [address] [place] [facility] [address] [place] [address] Phone: [phone] Fax: [phone]     Social Drivers of Health (SDOH) Social History: SDOH Screenings   Food Insecurity: No Food Insecurity (08/21/2023)  Housing: Low Risk  (08/21/2023)  Transportation Needs: No Transportation Needs (08/21/2023)  Utilities: Not At Risk (08/21/2023)  Financial Resource Strain: Low Risk  (02/21/2023)   Received from [facility]  Social Connections: Socially Isolated (08/21/2023)  Tobacco Use: Medium Risk (08/21/2023)   SDOH Interventions:     Readmission Risk Interventions    08/22/2023    2:18 PM  Readmission Risk Prevention Plan  Transportation Screening Complete  Medication Review (RN Care Manager) Complete  PCP or Specialist appointment within 3-5 days of discharge Complete  HRI or Home Care Consult Complete  SW Recovery Care/Counseling Consult Complete  Palliative Care Screening Not Applicable  Skilled Nursing Facility Not Complete  SNF Comments Will complete tomorrow if she will meet 3-night inpatient stay.

## 2023-08-22 NOTE — Evaluation (Signed)
 Occupational Therapy Evaluation Patient Details Name: [name] [name] MRN: [id number] DOB: 1935-08-11 Today's Date: 08/22/2023   History of Present Illness   Pt is an 88 y.o. female presenting to hospital 08/21/23 with c/o generalized weakness x4 weeks.  Hospitalization April for CHF exacerbation and generalized weakness; c/o L foot pain at that time--(-) fx via x-ray.  Pt admitted with NSTEMI, HFrEF, possible community acquired PNA.  PMH includes htn, h/o stroke, DM type 2, chronic systolic CHF, dementia, R breast CA, s/p R lumpectomy, B knee sx.     Clinical Impressions Chart reviewed to date, pt greeted in chair, alert, oriented to self, place, date; grossly oriented to situation but ?insight, will continue to assess. PTA pt reports her daughter and grand daughter have been helping her and staying with her all the time and is requiring assist for dressing and bathing, set up for feeding/grooming and assist for all IADLs. Pt reports she amb with rollator. Prior to a month ago, pt was MOD I in most ADLs with assist with IADLs. Pt presents with deficits in strength, endurance, activity tolerance, balance, affecting safe and optimal ADL completion. STS completed with MIN A, pt reports feeling very weak, steps one step forward/back with RW with MIN A and requests to sit down. BP in sitting 108/76, standing 108/73, unable to tolerate for 3 minute BP reading. Supervision required for washing face, MAX A for LB dressing. Pt is performing ADL below PLOF, will benefit from acute OT to address functional deficits and to facilitate optimal ADL performance.Discussed DME for safe ADL completion/mobility. Pt is left in chair, all needs met. OT will follow acutely.     If plan is discharge home, recommend the following:   A lot of help with walking and/or transfers;A lot of help with bathing/dressing/bathroom     Functional Status Assessment   Patient has had a recent decline in their functional status and  demonstrates the ability to make significant improvements in function in a reasonable and predictable amount of time.     Equipment Recommendations   Wheelchair (measurements OT);Hospital bed     Recommendations for Other Services         Precautions/Restrictions   Precautions Precautions: Fall Recall of Precautions/Restrictions: Impaired Restrictions Weight Bearing Restrictions Per Provider Order: No     Mobility Bed Mobility               General bed mobility comments: NT in recliner pre/post session    Transfers Overall transfer level: Needs assistance Equipment used: Rolling walker (2 wheels) Transfers: Sit to/from Stand Sit to Stand: Min assist           General transfer comment: two attempts, frequent vcs for technique      Balance Overall balance assessment: Needs assistance Sitting-balance support: No upper extremity supported, Feet supported Sitting balance-Leahy Scale: Good     Standing balance support: Bilateral upper extremity supported, Reliant on assistive device for balance Standing balance-Leahy Scale: Fair                             ADL either performed or assessed with clinical judgement   ADL Overall ADL's : Needs assistance/impaired     Grooming: Supervision/safety;Wash/dry face;Sitting               Lower Body Dressing: Maximal assistance Lower Body Dressing Details (indicate cue type and reason): socks Toilet Transfer: Minimal assistance;Rolling walker (2 wheels) Toilet Transfer Details (indicate cue  type and reason): simulated Toileting- Clothing Manipulation and Hygiene: Maximal assistance;Sit to/from stand         General ADL Comments: STS with MIN A two attempts, tolerates approx 1 step away from chair with RW with MIN A, steps backwards and requests to return to chair due to fatigue     Vision Patient Visual Report: No change from baseline       Perception         Praxis          Pertinent Vitals/Pain Pain Assessment Pain Assessment: No/denies pain     Extremity/Trunk Assessment Upper Extremity Assessment Upper Extremity Assessment: Generalized weakness   Lower Extremity Assessment Lower Extremity Assessment: Generalized weakness       Communication Communication Communication: No apparent difficulties   Cognition Arousal: Alert Behavior During Therapy: Flat affect Cognition: Cognition impaired   Orientation impairments: Situation (grossly oriented but ?overall awareness)     Attention impairment (select first level of impairment): Selective attention Executive functioning impairment (select all impairments): Reasoning                   Following commands: Impaired Following commands impaired: Follows one step commands with increased time     Cueing  General Comments   Cueing Techniques: Verbal cues;Visual cues;Tactile cues  pt reports dizziness upon standing, BP 108/76 HR 85 bpm in sitting, 108/73 HR 89 bpm in standing; does not feel she can stand for 3 minute orthostatics   Exercises Other Exercises Other Exercises: edu GN:FAOZ of OT, role of rehab, discharge recommendations, safe ADL completion   Shoulder Instructions      Home Living Family/patient expects to be discharged to:: Private residence Living Arrangements: Alone (pt reports her daugther/grand daugther have been staying w her) Available Help at Discharge: Family Type of Home: Independent living facility                       Home Equipment: Rollator (4 wheels);Shower seat   Additional Comments: [facility].  3rd floor apt (elevator access).      Prior Functioning/Environment Prior Level of Function : Independent/Modified Independent             Mobility Comments: Modified independent ambulating with rollator.  Pt reports 3 falls in last month. ADLs Comments: prior to rehab/approx 4 weeks ago- pt was MOD I with dressing,  feeding, grooming, sits on shower chair in shower; assist for IADLs- pt reports meals brought to her apt, grand daugther assists with med management, daugther/grand daugther drive her; now she reports assist for dressing, bathing; SET UP for feeding/grooming; assist for all IADLs    OT Problem List: Decreased strength;Decreased activity tolerance;Decreased knowledge of use of DME or AE;Impaired balance (sitting and/or standing);Decreased safety awareness   OT Treatment/Interventions: Self-care/ADL training;DME and/or AE instruction;Therapeutic activities;Balance training;Therapeutic exercise;Energy conservation;Patient/family education      OT Goals(Current goals can be found in the care plan section)   Acute Rehab OT Goals Patient Stated Goal: go home OT Goal Formulation: With patient Time For Goal Achievement: 09/05/23 Potential to Achieve Goals: Fair ADL Goals Pt Will Perform Grooming: with supervision;sitting;standing Pt Will Perform Lower Body Dressing: with supervision;sitting/lateral leans;sit to/from stand Pt Will Transfer to Toilet: ambulating Pt Will Perform Toileting - Clothing Manipulation and hygiene: with min assist;sit to/from stand;sitting/lateral leans   OT Frequency:  Min 2X/week    Co-evaluation              AM-PAC  OT "6 Clicks" Daily Activity     Outcome Measure Help from another person eating meals?: None Help from another person taking care of personal grooming?: None Help from another person toileting, which includes using toliet, bedpan, or urinal?: A Lot Help from another person bathing (including washing, rinsing, drying)?: A Lot Help from another person to put on and taking off regular upper body clothing?: A Little Help from another person to put on and taking off regular lower body clothing?: A Lot 6 Click Score: 17   End of Session Equipment Utilized During Treatment: Rolling walker (2 wheels);Gait belt  Activity Tolerance: Patient tolerated  treatment well Patient left: in chair;with call bell/phone within reach;with chair alarm set  OT Visit Diagnosis: Other abnormalities of gait and mobility (R26.89)                Time: 5284-1324 OT Time Calculation (min): 18 min Charges:  OT General Charges $OT Visit: 1 Visit OT Evaluation $OT Eval Moderate Complexity: 1 Mod  Gerre Kraft, OTD OTR/L  08/22/23, 11:40 AM

## 2023-08-22 NOTE — Progress Notes (Signed)
 6 beat run of asymptomatic Vtach at 0600 while patient was sleeping. Heparin infusing, 2LNC for comfort. Patient has SOB upon exertion. Lungs clear/diminished. Vital signs stable.

## 2023-08-23 DIAGNOSIS — I255 Ischemic cardiomyopathy: Secondary | ICD-10-CM

## 2023-08-23 DIAGNOSIS — I214 Non-ST elevation (NSTEMI) myocardial infarction: Secondary | ICD-10-CM | POA: Diagnosis not present

## 2023-08-23 LAB — URINALYSIS, W/ REFLEX TO CULTURE (INFECTION SUSPECTED)
Bilirubin Urine: NEGATIVE
Glucose, UA: NEGATIVE mg/dL
Hgb urine dipstick: NEGATIVE
Ketones, ur: NEGATIVE mg/dL
Nitrite: NEGATIVE
Protein, ur: >=300 mg/dL — AB
Specific Gravity, Urine: 1.031 — ABNORMAL HIGH (ref 1.005–1.030)
pH: 5 (ref 5.0–8.0)

## 2023-08-23 LAB — BASIC METABOLIC PANEL WITH GFR
Anion gap: 9 (ref 5–15)
BUN: 28 mg/dL — ABNORMAL HIGH (ref 8–23)
CO2: 19 mmol/L — ABNORMAL LOW (ref 22–32)
Calcium: 8.7 mg/dL — ABNORMAL LOW (ref 8.9–10.3)
Chloride: 111 mmol/L (ref 98–111)
Creatinine, Ser: 1.1 mg/dL — ABNORMAL HIGH (ref 0.44–1.00)
GFR, Estimated: 49 mL/min — ABNORMAL LOW (ref >60)
Glucose, Bld: 153 mg/dL — ABNORMAL HIGH (ref 70–99)
Potassium: 4.1 mmol/L (ref 3.5–5.1)
Sodium: 139 mmol/L (ref 135–145)

## 2023-08-23 LAB — CBC
HCT: 39.5 % (ref 36.0–46.0)
Hemoglobin: 12.7 g/dL (ref 12.0–15.0)
MCH: 29.7 pg (ref 26.0–34.0)
MCHC: 32.2 g/dL (ref 30.0–36.0)
MCV: 92.5 fL (ref 80.0–100.0)
Platelets: 285 10*3/uL (ref 150–400)
RBC: 4.27 MIL/uL (ref 3.87–5.11)
RDW: 16.9 % — ABNORMAL HIGH (ref 11.5–15.5)
WBC: 5.6 10*3/uL (ref 4.0–10.5)
nRBC: 0 % (ref 0.0–0.2)

## 2023-08-23 LAB — GLUCOSE, CAPILLARY
Glucose-Capillary: 145 mg/dL — ABNORMAL HIGH (ref 70–99)
Glucose-Capillary: 151 mg/dL — ABNORMAL HIGH (ref 70–99)
Glucose-Capillary: 129 mg/dL — ABNORMAL HIGH (ref 70–99)
Glucose-Capillary: 101 mg/dL — ABNORMAL HIGH (ref 70–99)

## 2023-08-23 LAB — HEPARIN LEVEL (UNFRACTIONATED): Heparin Unfractionated: 0.39 [IU]/mL (ref 0.30–0.70)

## 2023-08-23 MED ORDER — ENOXAPARIN SODIUM 40 MG/0.4ML IJ SOSY
40.0000 mg | PREFILLED_SYRINGE | INTRAMUSCULAR | Status: DC
  Administered 2023-08-23 – 2023-08-30 (×8): 40 mg via SUBCUTANEOUS
  Filled 2023-08-23 (×8): qty 0.4

## 2023-08-23 MED ORDER — POLYETHYLENE GLYCOL 3350 17 G PO PACK
17.0000 g | PACK | Freq: Every day | ORAL | Status: DC
  Administered 2023-08-23 – 2023-08-31 (×6): 17 g via ORAL
  Filled 2023-08-23 (×9): qty 1

## 2023-08-23 NOTE — Progress Notes (Signed)
 PROGRESS NOTE    [name] [name]  ZOX:[id number] DOB: 25-Dec-1935 DOA: 08/21/2023 PCP: Melchor Spoon, MD     Brief Narrative:   From admission h and p  [name] [name] is a 88 y.o. female with medical history significant for HFrEF, severe TR, type 2 diabetes, dementia, history of CVA, and hyperlipidemia who presents to the ED via EMS for evaluation of generalized weakness. Patient only oriented to self and person so history obtained from daughter. Per daughter, patient has been weak since her last hospitalization but then after completed 3 weeks of rehab. Last Friday, she started having left-sided chest pain so EMS was called. An EKG was done and patient was given nitro resolved, to the ED for further evaluation but patient declined.  Patient has continued to week and lethargic and occasionally agitated at night. She has had 2-3 falls in the past month due to significant weakness. Patient has had mild shortness of breath but no fevers, chills, dysuria, cough, diarrhea or constipation.   Assessment & Plan:   Principal Problem:   NSTEMI (non-ST elevated myocardial infarction) (HCC) Active Problems:   Diabetes (HCC)   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)   Essential hypertension   PAD (peripheral artery disease) (HCC)   History of CVA (cerebrovascular accident)   Chronic HFrEF (heart failure with reduced ejection fraction) (HCC)   Pneumonia due to infectious organism   Weakness   Pulmonary hypertension, moderate to severe (HCC)   HFrEF (heart failure with reduced ejection fraction) (HCC)   Ischemic cardiomyopathy  # NSTEMI Asymptomatic currently, troponin peak of 3400. Cardiology advises medical mgmt and patient has declined cath. Now s/p 48 hours IV heparin - dapt, coreg , statin  # HFrEF Biventricular failure, EF 25%, severe MR, pHTN. Has not followed up with advanced heart failure. Does not appear to be exacerbated. Prognosis poor - continue coreg , losartan ,  jardiance, spironolactone  - palliative consulted, daughter considering hospice  # Pulmonary infiltrate No clinical signs/symptoms pneumonia - hold abx  # Dementia No behavioral disturbance. Resides at ALF  # Debility - PT/OT advising snf, daughter is interested, TOC aware  # T2DM Glucose controlled - continue ssi - d/c metformin, given advanced systolic heart failure would be hesitant to resume that at discharge  # Hx CVA Nothing acute on CT of head/neck, appears to be at baseline - meds as above  # HTN Bp appropriate - continue current regimen   DVT prophylaxis: lovenox  Code Status: dnr/dni Family Communication: daughter gayle updated telephonically 5/14  Level of care: Telemetry Cardiac Status is: Inpatient Remains inpatient appropriate because: pending snf placement    Consultants:  Cardiology (signed off)  Procedures: none  Antimicrobials:  none    Subjective: Reports feeling fine, no cough or chest pain, tolerating diet  Objective: Vitals:   08/22/23 2344 08/23/23 0444 08/23/23 0822 08/23/23 1147  BP: 95/72 112/71 111/87 126/84  Pulse: 70 83 84 79  Resp: 18 16    Temp: 97.9 F (36.6 C) 98 F (36.7 C) 97.8 F (36.6 C) 98 F (36.7 C)  TempSrc:   Axillary Axillary  SpO2: 100% 100% 100% 99%  Weight:      Height:       No intake or output data in the 24 hours ending 08/23/23 1321  Filed Weights   08/21/23 1054  Weight: 72.8 kg    Examination:  General exam: Appears calm and comfortable  Respiratory system: Clear to auscultation save for rales at bases Cardiovascular system: S1 &  S2 heard, distant sounds, murmur present Gastrointestinal system: Abdomen is nondistended, soft and nontender.  Central nervous system: Alert and oriented to self and place, moving all 4 Extremities: Symmetric 5 x 5 power. Skin: No rashes, lesions or ulcers Psychiatry: calm, mild confusion    Data Reviewed: I have personally reviewed following labs and  imaging studies  CBC: Recent Labs  Lab 08/21/23 1053 08/22/23 0524 08/23/23 0359  WBC 7.6 6.5 5.6  NEUTROABS 6.1  --   --   HGB 13.3 13.2 12.7  HCT 41.0 39.5 39.5  MCV 93.8 91.0 92.5  PLT 313 299 285   Basic Metabolic Panel: Recent Labs  Lab 08/21/23 1053 08/22/23 0524 08/23/23 0359  NA 142 139 139  K 4.5 4.1 4.1  CL 111 109 111  CO2 19* 20* 19*  GLUCOSE 177* 162* 153*  BUN 26* 24* 28*  CREATININE 1.09* 0.84 1.10*  CALCIUM 9.0 8.8* 8.7*   GFR: Estimated Creatinine Clearance: 36.7 mL/min (A) (by C-G formula based on SCr of 1.1 mg/dL (H)). Liver Function Tests: Recent Labs  Lab 08/21/23 1053  AST 40  ALT 14  ALKPHOS 78  BILITOT 1.3*  PROT 7.2  ALBUMIN 3.4*   No results for input(s): "LIPASE", "AMYLASE" in the last 168 hours. No results for input(s): "AMMONIA" in the last 168 hours. Coagulation Profile: Recent Labs  Lab 08/21/23 1335  INR 1.6*   Cardiac Enzymes: No results for input(s): "CKTOTAL", "CKMB", "CKMBINDEX", "TROPONINI" in the last 168 hours. BNP (last 3 results) No results for input(s): "PROBNP" in the last 8760 hours. HbA1C: No results for input(s): "HGBA1C" in the last 72 hours. CBG: Recent Labs  Lab 08/22/23 1116 08/22/23 1741 08/22/23 2246 08/23/23 0821 08/23/23 1146  GLUCAP 179* 149* 151* 145* 151*   Lipid Profile: No results for input(s): "CHOL", "HDL", "LDLCALC", "TRIG", "CHOLHDL", "LDLDIRECT" in the last 72 hours. Thyroid  Function Tests: No results for input(s): "TSH", "T4TOTAL", "FREET4", "T3FREE", "THYROIDAB" in the last 72 hours. Anemia Panel: No results for input(s): "VITAMINB12", "FOLATE", "FERRITIN", "TIBC", "IRON", "RETICCTPCT" in the last 72 hours. Urine analysis:    Component Value Date/Time   COLORURINE AMBER (A) 08/23/2023 0117   APPEARANCEUR HAZY (A) 08/23/2023 0117   APPEARANCEUR Clear 03/12/2014 1000   LABSPEC 1.031 (H) 08/23/2023 0117   LABSPEC 1.018 03/12/2014 1000   PHURINE 5.0 08/23/2023 0117    GLUCOSEU NEGATIVE 08/23/2023 0117   GLUCOSEU Negative 03/12/2014 1000   HGBUR NEGATIVE 08/23/2023 0117   BILIRUBINUR NEGATIVE 08/23/2023 0117   BILIRUBINUR Negative 03/12/2014 1000   KETONESUR NEGATIVE 08/23/2023 0117   PROTEINUR >=300 (A) 08/23/2023 0117   NITRITE NEGATIVE 08/23/2023 0117   LEUKOCYTESUR SMALL (A) 08/23/2023 0117   LEUKOCYTESUR Trace 03/12/2014 1000   Sepsis Labs: @LABRCNTIP (procalcitonin:4,lacticidven:4)  )No results found for this or any previous visit (from the past 240 hours).       Radiology Studies: No results found.       Scheduled Meds:  aspirin  EC  81 mg Oral Daily   atorvastatin  40 mg Oral Daily   clopidogrel  75 mg Oral Daily   dapagliflozin  propanediol  10 mg Oral Daily   insulin  aspart  0-15 Units Subcutaneous TID WC   insulin  aspart  0-5 Units Subcutaneous QHS   losartan   12.5 mg Oral Daily   memantine   20 mg Oral QPM   metoprolol succinate  12.5 mg Oral QHS   spironolactone   12.5 mg Oral Daily   Continuous Infusions:     LOS:  2 days     Raymonde Calico, MD [facility]   If 7PM-7AM, please contact night-coverage [email or web address] Password [email or web address] 08/23/2023, 1:21 PM

## 2023-08-23 NOTE — Consult Note (Signed)
 Consultation Note Date: 08/23/2023   Patient Name: [name] [name]  DOB: [birth year]  MRN: [id number]  Age / Sex: 88 y.o., female  PCP: Melchor Spoon, MD Referring Physician: Janeane Mealy, MD  Reason for Consultation: Establishing goals of care  HPI: From H&P "Audi Mccaig is a 88 y.o. female with medical history significant for HFrEF, severe TR, type 2 diabetes, dementia, history of CVA, and hyperlipidemia who presents to the ED via EMS for evaluation of generalized weakness. Patient only oriented to self and person so history obtained from daughter. Per daughter, patient has been weak since her last hospitalization but then after completed 3 weeks of rehab. Last Friday, she started having left-sided chest pain so EMS was called. An EKG was done and patient was given nitro resolved, to the ED for further evaluation but patient declined.  Patient has continued to week and lethargic and occasionally agitated at night. She has had 2-3 falls in the past month due to significant weakness. Patient has had mild shortness of breath but no fevers, chills, dysuria, cough, diarrhea or constipation"   Clinical Assessment and Goals of Care: Notes and labs reviewed.  In to see patient.  Patient denies complaint at this time.  Patient discusses that she has 1 daughter and 2 grandchildren.  She states she is widowed.  She advises that she lives at [facility] independent living facility.    She states they do the cooking and cleaning.  She states her daughter and granddaughter come to visit and help as needed.  She states she uses a walker at baseline and does require assistance with bathing.  I offered to call her family to have them come for a family meeting.  She is not interested in this at this time.  We discussed her diagnosis, prognosis, GOC, EOL wishes disposition and options.  She is able to correctly articulate  her current status with NSTEMI, chronic conditions, and medical history.  She is aware of her poor prognosis and the chances of rehospitalization's.  A detailed discussion was had today regarding advanced directives.  Concepts specific to code status, artifical feeding and hydration, IV antibiotics and rehospitalization were discussed.  The difference between an aggressive medical intervention path and a comfort care path was discussed.  Values and goals of care important to patient and family were attempted to be elicited.  Discussed limitations of medical interventions to prolong quality of life in some situations and discussed the concept of human mortality.  Patient confirms DNR and DNI status.  She advises that she is tired and she is ready to go.  She states her family is aware of these wishes but would like for her to continue life-prolonging care.  She states she is not interested in talking with them about transition to hospice level care and will continue current care at this time.  She states what ever is meant to happen will happen, and she is okay with this.  Again offered to have a meeting with her and her  family and she declines this.  She confirms that her daughter would be her surrogate decision maker if she is unable to make decisions for herself.    SUMMARY OF RECOMMENDATIONS   Patient confirms DNR/DNI status.   She confirms daughter would be her surrogate decision maker if she is unable to make decisions for herself. Would recommend discharge with outpatient palliative to follow, and transition to hospice level care when patient/family are ready to do so.        Primary Diagnoses: Present on Admission:  NSTEMI (non-ST elevated myocardial infarction) (HCC)  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)  Essential hypertension  PAD (peripheral artery disease) (HCC)  (Resolved) Stage 3a chronic kidney disease (HCC)  Pulmonary hypertension, moderate to severe (HCC)   Chronic HFrEF (heart failure with reduced ejection fraction) (HCC)   I have reviewed the medical record, interviewed the patient and family, and examined the patient. The following aspects are pertinent.  Past Medical History:  Diagnosis Date   Cancer [facility])    right breast cancer   Diabetes mellitus without complication (HCC)    Hypertension    Stroke [facility])    Social History   Socioeconomic History   Marital status: Widowed    Spouse name: Not on file   Number of children: Not on file   Years of education: Not on file   Highest education level: Not on file  Occupational History   Not on file  Tobacco Use   Smoking status: Former    Current packs/day: 0.00    Types: Cigarettes    Quit date: 78    Years since quitting: 35.3   Smokeless tobacco: Never  Vaping Use   Vaping status: Never Used  Substance and Sexual Activity   Alcohol use: No   Drug use: No   Sexual activity: Not on file  Other Topics Concern   Not on file  Social History Narrative   Not on file   Social Drivers of Health   Financial Resource Strain: Low Risk  (02/21/2023)   Received from [facility]   Overall Financial Resource Strain (CARDIA)    Difficulty of Paying Living Expenses: Not hard at all  Food Insecurity: No Food Insecurity (08/21/2023)   Hunger Vital Sign    Worried About Running Out of Food in the Last Year: Never true    Ran Out of Food in the Last Year: Never true  Transportation Needs: No Transportation Needs (08/21/2023)   PRAPARE - Administrator, Civil Service (Medical): No    Lack of Transportation (Non-Medical): No  Physical Activity: Not on file  Stress: Not on file  Social Connections: Socially Isolated (08/21/2023)   Social Connection and Isolation Panel [NHANES]    Frequency of Communication with Friends and Family: More than three times a week    Frequency of Social Gatherings with Friends and Family: More than three times a week    Attends  Religious Services: Never    Database administrator or Organizations: No    Attends Banker Meetings: Never    Marital Status: Widowed   History reviewed. No pertinent family history. Scheduled Meds:  aspirin  EC  81 mg Oral Daily   atorvastatin  40 mg Oral Daily   clopidogrel  75 mg Oral Daily   dapagliflozin  propanediol  10 mg Oral Daily   insulin  aspart  0-15 Units Subcutaneous TID WC   insulin  aspart  0-5 Units Subcutaneous QHS  losartan   12.5 mg Oral Daily   memantine   20 mg Oral QPM   metoprolol succinate  12.5 mg Oral QHS   spironolactone   12.5 mg Oral Daily   Continuous Infusions:  heparin 950 Units/hr (08/22/23 1112)   PRN Meds:.acetaminophen  **OR** acetaminophen , colchicine , loperamide, ondansetron  **OR** ondansetron  (ZOFRAN ) IV, risperiDONE, senna-docusate Medications Prior to Admission:  Prior to Admission medications   Medication Sig Start Date End Date Taking? Authorizing Provider  allopurinol  (ZYLOPRIM ) 100 MG tablet Take 1 tablet by mouth daily. 03/05/23  Yes [provider]  aspirin  EC 81 MG tablet Take 1 tablet (81 mg total) by mouth daily. Swallow whole. 04/04/23  Yes Alexander, Natalie, DO  busPIRone (BUSPAR) 7.5 MG tablet Take 7.5 mg by mouth 2 (two) times daily. 07/10/23 07/09/24 Yes [provider]  carvedilol  (COREG ) 3.125 MG tablet Take 1 tablet (3.125 mg total) by mouth 2 (two) times daily with a meal. 07/19/23  Yes Djan, Esther Hem, MD  colchicine  0.6 MG tablet Take 0.6 mg by mouth daily as needed. 08/12/23  Yes [provider]  dapagliflozin  propanediol (FARXIGA ) 10 MG TABS tablet Take 1 tablet (10 mg total) by mouth daily. 07/20/23  Yes Ezzard Holms, MD  furosemide  (LASIX ) 20 MG tablet Take 20 mg by mouth daily. 08/04/23  Yes [provider]  loperamide (LOPERAMIDE A-D) 2 MG tablet Take 2 mg by mouth as needed for diarrhea or loose stools.   Yes [provider]  losartan  (COZAAR ) 25 MG tablet Take 12.5 mg  by mouth daily. 08/12/23  Yes [provider]  memantine  (NAMENDA ) 10 MG tablet Take 2 tablets by mouth every evening.  10/26/15  Yes [provider]  metFORMIN (GLUCOPHAGE) 1000 MG tablet Take 1 tablet by mouth 2 (two) times daily. Take 1 tablet in the morning and 1 tablet in the evening 01/11/16  Yes [provider]  nystatin (MYCOSTATIN/NYSTOP) powder Apply 1 Application topically 3 (three) times daily. 08/04/23  Yes [provider]  promethazine (PHENERGAN) 12.5 MG tablet Take 12.5 mg by mouth every 4 (four) hours as needed.   Yes [provider]  risperiDONE (RISPERDAL) 0.5 MG tablet Take 0.5 mg by mouth 2 (two) times daily as needed. 08/15/23  Yes [provider]  spironolactone  (ALDACTONE ) 25 MG tablet Take 12.5 mg by mouth daily. 08/12/23  Yes [provider]  sodium chloride  (OCEAN) 0.65 % SOLN nasal spray Place 1 spray into both nostrils as needed for congestion. Patient not taking: Reported on 08/21/2023    [provider]   Allergies  Allergen Reactions   Codeine Other (See Comments)    "I go crazy"   Etodolac     Other reaction(s): Unknown   Indomethacin     Other reaction(s): Unknown   Penicillins Other (See Comments)    Does not remember this allergy Has patient had a PCN reaction causing immediate rash, facial/tongue/throat swelling, SOB or lightheadedness with hypotension: Unknown Has patient had a PCN reaction causing severe rash involving mucus membranes or skin necrosis: Unknown Has patient had a PCN reaction that required hospitalization: Unknown Has patient had a PCN reaction occurring within the last 10 years: Unknown If all of the above answers are "NO", then may proceed with Cephalosporin use.    Tizanidine     Other reaction(s): Syncope Fell on the floor    Tramadol     Other reaction(s): Unknown   Review of Systems  All other systems reviewed and are negative.  Physical Exam Skin:     General: Skin is warm and dry.  Neurological:     Mental Status: She is alert.     Vital Signs: BP 126/84 (BP Location: Right Arm)   Pulse 79   Temp 98 F (36.7 C) (Axillary)   Resp 16   Ht 5\' 5"  (1.651 m)   Wt 72.8 kg   SpO2 99%   BMI 26.69 kg/m  Pain Scale: 0-10   Pain Score: 0-No pain   SpO2: SpO2: 99 % O2 Device:SpO2: 99 % O2 Flow Rate: .O2 Flow Rate (L/min): 2 L/min  IO: Intake/output summary:  Intake/Output Summary (Last 24 hours) at 08/23/2023 1220 Last data filed at 08/22/2023 1302 Gross per 24 hour  Intake --  Output 300 ml  Net -300 ml    LBM:   Baseline Weight: Weight: 72.8 kg Most recent weight: Weight: 72.8 kg      Signed by: Meribeth Standard, NP   Please contact Palliative Medicine Team phone at [phone] for questions and concerns.  For individual provider: See Tilford Foley

## 2023-08-23 NOTE — TOC Progression Note (Signed)
 Transition of Care [facility]) - Progression Note    Patient Details  Name: [name] [name] MRN: [id number] Date of Birth: Aug 05, 1935  Transition of Care [facility]) CM/SW Contact  Baird Bombard, RN Phone Number: 08/23/2023, 3:34 PM  Clinical Narrative:    Retrieved call from patient's daughter, Francoise Ishihara. She inquired about options for discharge including home hospice and SNF. She and her daughter plan to visit with patient today and will talk with patient about a decision.     Expected Discharge Plan:  (TBD) Barriers to Discharge: Continued Medical Work up  Expected Discharge Plan and Services     Post Acute Care Choice:  (TBD) Living arrangements for the past 2 months: Independent Living Facility                           HH Arranged: RN, PT, OT [facility] Agency: [facility] Date HH Agency Contacted: 08/22/23   Representative spoke with at [facility] Agency: Shaun   Social Determinants of Health (SDOH) Interventions SDOH Screenings   Food Insecurity: No Food Insecurity (08/21/2023)  Housing: Low Risk  (08/21/2023)  Transportation Needs: No Transportation Needs (08/21/2023)  Utilities: Not At Risk (08/21/2023)  Financial Resource Strain: Low Risk  (02/21/2023)   Received from [facility]  Social Connections: Socially Isolated (08/21/2023)  Tobacco Use: Medium Risk (08/21/2023)    Readmission Risk Interventions    08/22/2023    2:18 PM  Readmission Risk Prevention Plan  Transportation Screening Complete  Medication Review (RN Care Manager) Complete  PCP or Specialist appointment within 3-5 days of discharge Complete  HRI or Home Care Consult Complete  SW Recovery Care/Counseling Consult Complete  Palliative Care Screening Not Applicable  Skilled Nursing Facility Not Complete  SNF Comments Will complete tomorrow if she will meet 3-night inpatient stay.

## 2023-08-23 NOTE — Plan of Care (Signed)
  Problem: Health Behavior/Discharge Planning: Goal: Ability to manage health-related needs will improve Outcome: Progressing   Problem: Clinical Measurements: Goal: Ability to maintain clinical measurements within normal limits will improve Outcome: Progressing   Problem: Nutrition: Goal: Adequate nutrition will be maintained Outcome: Progressing   Problem: Education: Goal: Ability to describe self-care measures that may prevent or decrease complications (Diabetes Survival Skills Education) will improve Outcome: Progressing   Problem: Fluid Volume: Goal: Ability to maintain a balanced intake and output will improve Outcome: Progressing

## 2023-08-23 NOTE — Plan of Care (Signed)
   Problem: Activity: Goal: Risk for activity intolerance will decrease Outcome: Progressing   Problem: Nutrition: Goal: Adequate nutrition will be maintained Outcome: Progressing   Problem: Safety: Goal: Ability to remain free from injury will improve Outcome: Progressing

## 2023-08-23 NOTE — Progress Notes (Signed)
 Rounding Note    Patient Name: [name] [name] Date of Encounter: 08/23/2023  [facility] Cardiologist: None   Subjective   Patient reports feeling well without chest pain and shortness of breath.   Inpatient Medications    Scheduled Meds:  aspirin  EC  81 mg Oral Daily   atorvastatin  40 mg Oral Daily   clopidogrel  75 mg Oral Daily   dapagliflozin  propanediol  10 mg Oral Daily   insulin  aspart  0-15 Units Subcutaneous TID WC   insulin  aspart  0-5 Units Subcutaneous QHS   losartan   12.5 mg Oral Daily   memantine   20 mg Oral QPM   metoprolol succinate  12.5 mg Oral QHS   spironolactone   12.5 mg Oral Daily   Continuous Infusions:  heparin 950 Units/hr (08/22/23 1112)   PRN Meds: acetaminophen  **OR** acetaminophen , colchicine , loperamide, ondansetron  **OR** ondansetron  (ZOFRAN ) IV, risperiDONE, senna-docusate   Vital Signs    Vitals:   08/22/23 2059 08/22/23 2344 08/23/23 0444 08/23/23 0822  BP: 99/73 95/72 112/71 111/87  Pulse: 75 70 83 84  Resp: 16 18 16    Temp: 98 F (36.7 C) 97.9 F (36.6 C) 98 F (36.7 C) 97.8 F (36.6 C)  TempSrc:    Axillary  SpO2: 100% 100% 100% 100%  Weight:      Height:        Intake/Output Summary (Last 24 hours) at 08/23/2023 0838 Last data filed at 08/22/2023 1302 Gross per 24 hour  Intake --  Output 300 ml  Net -300 ml      08/21/2023   10:54 AM 08/10/2023   11:40 AM 07/19/2023    2:54 AM  Last 3 Weights  Weight (lbs) 160 lb 6.4 oz 161 lb 159 lb 13.3 oz  Weight (kg) 72.757 kg 73.029 kg 72.5 kg      Physical Exam   GEN: No acute distress.   Neck: No JVD Cardiac: RRR, no murmurs, rubs, or gallops.  Respiratory: Diminished at the bases GI: Soft, nontender, non-distended  MS: No edema; No deformity. Neuro:  Nonfocal  Psych: Normal affect   Labs    High Sensitivity Troponin:   Recent Labs  Lab 08/21/23 1053 08/21/23 1335  TROPONINIHS 3,389* 3,193*     Chemistry Recent Labs  Lab 08/21/23 1053  08/22/23 0524 08/23/23 0359  NA 142 139 139  K 4.5 4.1 4.1  CL 111 109 111  CO2 19* 20* 19*  GLUCOSE 177* 162* 153*  BUN 26* 24* 28*  CREATININE 1.09* 0.84 1.10*  CALCIUM 9.0 8.8* 8.7*  PROT 7.2  --   --   ALBUMIN 3.4*  --   --   AST 40  --   --   ALT 14  --   --   ALKPHOS 78  --   --   BILITOT 1.3*  --   --   GFRNONAA 49* >60 49*  ANIONGAP 12 10 9     Lipids No results for input(s): "CHOL", "TRIG", "HDL", "LABVLDL", "LDLCALC", "CHOLHDL" in the last 168 hours.  Hematology Recent Labs  Lab 08/21/23 1053 08/22/23 0524 08/23/23 0359  WBC 7.6 6.5 5.6  RBC 4.37 4.34 4.27  HGB 13.3 13.2 12.7  HCT 41.0 39.5 39.5  MCV 93.8 91.0 92.5  MCH 30.4 30.4 29.7  MCHC 32.4 33.4 32.2  RDW 16.9* 16.5* 16.9*  PLT 313 299 285   Thyroid  No results for input(s): "TSH", "FREET4" in the last 168 hours.  BNP Recent Labs  Lab  08/21/23 1053  BNP 1,549.7*    DDimer No results for input(s): "DDIMER" in the last 168 hours.   Radiology    CT Head Wo Contrast Result Date: 08/21/2023 IMPRESSION: No evidence of acute intracranial or cervical spine injury. Electronically Signed   By: Ronnette Coke M.D.   On: 08/21/2023 12:13   CT Cervical Spine Wo Contrast Result Date: 08/21/2023 IMPRESSION: No evidence of acute intracranial or cervical spine injury. Electronically Signed   By: Ronnette Coke M.D.   On: 08/21/2023 12:13   DG Chest 2 View Result Date: 08/21/2023 IMPRESSION: Infiltrates as described could correlate with pneumonia Electronically Signed   By: Fredrich Jefferson M.D.   On: 08/21/2023 11:45   Cardiac Studies   04/01/2023 Echo complete 1. Left ventricular ejection fraction, by estimation, is 20 to 25%. Left  ventricular ejection fraction by 3D volume is 21 %. The left ventricle has  severely decreased function. The left ventricle demonstrates global  hypokinesis. Left ventricular  diastolic parameters are consistent with Grade III diastolic dysfunction  (restrictive). Elevated left  ventricular end-diastolic pressure.   2. Right ventricular systolic function is severely reduced. The right  ventricular size is mildly enlarged. Tricuspid regurgitation signal is  inadequate for assessing PA pressure.   3. Left atrial size was mildly dilated.   4. Right atrial size was mild to moderately dilated.   5. The mitral valve is degenerative. Mild mitral valve regurgitation. No  evidence of mitral stenosis.   6. Tricuspid valve regurgitation is severe.   7. The aortic valve is tricuspid. Aortic valve regurgitation is not  visualized. Aortic valve sclerosis/calcification is present, without any  evidence of aortic stenosis. Aortic valve Vmax measures 1.41 m/s.   8. The inferior vena cava is normal in size with <50% respiratory  variability, suggesting right atrial pressure of 8 mmHg.   Patient Profile     88 y.o. female with a history of HFrEF, severe TR, T2DM, dementia, history of CVA, and hyperlipidemia, admitted 5/12 for weakness. On admission found to have NSTEMI with troponin 3000. CXR also showed possible pneumonia.   Assessment & Plan    NSTEMI - Presented for weakness, daughter noted patient experienced fleeting left sided chest pain several days ago. EMS was called, but the pain had resolved prior to their arrival and patient refused transport to the ED.  - Prior CT chest showing extensive coronary artery calcifications - Prior echo 03/2023 with EF 20-25% - Troponin 6578>4696 - Patient is asymptomatic from a cardiac perspective  - Not a good candidate for invasive procedures due to several comorbidities. Long discussion with patient and family with recommendation for conservative management.  - Continue IV heparin for 48 hours, can discontinue this afternoon - Continue DAPT with ASA and clopidogrel for 1 year if tolerated - Continue atorvastatin 40 mg daily - No plan for further ischemic evaluation at this time  HFrEF - Recent admission 03/2023 with echo showing EF  20 to 25% with global hypokinesis, elevated left ventricular end-diastolic pressure, G3 DD, mild MR and right atrial pressure of 8 mmHg - BNP 1549 - Does not appear grossly volume overloaded on exam - Continue spironolactone  12.5 mg daily, Farxiga  10 mg daily, losartan  12.5 mg daily, and metoprolol succinate 12.5 mg nightly - BP on the soft side, continue to monitor. May need to deescalate GDMT - Patient typically refuses Lasix  at home, consider Lasix  20 mg PRN at discharge  For questions or updates, please contact [facility] Please  consult [email or web address] for contact info under        Signed, Brodie Cannon, PA-C  08/23/2023, 8:38 AM

## 2023-08-23 NOTE — Consult Note (Signed)
 PHARMACY - ANTICOAGULATION CONSULT NOTE  Pharmacy Consult for heparin infusion Indication: chest pain/ACS  Patient Measurements: Height: 5\' 5"  (165.1 cm) Weight: 72.8 kg (160 lb 6.4 oz) IBW/kg (Calculated) : 57 HEPARIN DW (KG): 71.7  Vital Signs: Temp: 97.9 F (36.6 C) (05/13 2344) Temp Source: Oral (05/13 1730) BP: 95/72 (05/13 2344) Pulse Rate: 70 (05/13 2344)  Labs: Recent Labs    08/21/23 1053 08/21/23 1335 08/21/23 2126 08/22/23 0524 08/22/23 0643 08/22/23 1458 08/23/23 0359  HGB 13.3  --   --  13.2  --   --  12.7  HCT 41.0  --   --  39.5  --   --  39.5  PLT 313  --   --  299  --   --  285  APTT  --  >200*  --   --   --   --   --   LABPROT  --  19.4*  --   --   --   --   --   INR  --  1.6*  --   --   --   --   --   HEPARINUNFRC  --   --    < >  --  0.46 0.41 0.39  CREATININE 1.09*  --   --  0.84  --   --   --   TROPONINIHS 3,389* 3,193*  --   --   --   --   --    < > = values in this interval not displayed.   Estimated Creatinine Clearance: 48 mL/min (by C-G formula based on SCr of 0.84 mg/dL).  Medical History: Past Medical History:  Diagnosis Date   Cancer [facility])    right breast cancer   Diabetes mellitus without complication (HCC)    Hypertension    Stroke (HCC)    Medications:  No home anticoagulants per pharmacist review  Assessment: 88 yo female presented to the ED from independent living facility with generalized weakness.  PMH includes anxiety, GERD, HLD, Pulm HTN, T2DM, CHF, and CVA.  Troponin found to be elevated.  Pharmacy consulted to initiate heparin infusion.  Baseline aPTT and PT/INR ordered  Date Time HL Rate  Comment 5/12  2126 0.25 850 units/hr Subtherapeutic  5/13 0643 0.46 950 units/hr Therapeutic x 1, hgb and plt stable 5/13 1458 0.41 950 units/hr Therapeutic x 2 5/14 0359 0.39 950 units/hr Therapeutic x 3  Goal of Therapy:  Heparin level 0.3-0.7 units/ml Monitor platelets by anticoagulation protocol: Yes   Plan:  Continue  heparin infusion at 950 units/hr Check HL and CBC with AM labs  Coretta Dexter, PharmD, [facility] 08/23/2023 4:37 AM

## 2023-08-23 NOTE — Progress Notes (Signed)
 OT Cancellation Note  Patient Details Name: [name] [name] MRN: [id number] DOB: September 13, 1935   Cancelled Treatment:    Reason Eval/Treat Not Completed: Other (comment). Chart reviewed and treatment attempted. Pt in bed stating "I'm tired, I don't feel like doing anything right now." Edu on importance of OOB activity and pt verbalized understanding and asked therapist to come back tomorrow.  Syed Zukas E Gia Lusher 08/23/2023, 4:21 PM

## 2023-08-23 NOTE — Care Management Important Message (Signed)
 Important Message  Patient Details  Name: [name] [name] MRN: [id number] Date of Birth: Mar 03, 1936   Important Message Given:  Yes - Medicare IM     Anise Kerns 08/23/2023, 1:52 PM

## 2023-08-24 DIAGNOSIS — I214 Non-ST elevation (NSTEMI) myocardial infarction: Secondary | ICD-10-CM | POA: Diagnosis not present

## 2023-08-24 LAB — URINE CULTURE: Culture: <10000 — AB

## 2023-08-24 LAB — BASIC METABOLIC PANEL WITH GFR
Anion gap: 10 (ref 5–15)
BUN: 26 mg/dL — ABNORMAL HIGH (ref 8–23)
CO2: 21 mmol/L — ABNORMAL LOW (ref 22–32)
Calcium: 9.2 mg/dL (ref 8.9–10.3)
Chloride: 109 mmol/L (ref 98–111)
Creatinine, Ser: 1.07 mg/dL — ABNORMAL HIGH (ref 0.44–1.00)
GFR, Estimated: 51 mL/min — ABNORMAL LOW (ref >60)
Glucose, Bld: 106 mg/dL — ABNORMAL HIGH (ref 70–99)
Potassium: 4.2 mmol/L (ref 3.5–5.1)
Sodium: 140 mmol/L (ref 135–145)

## 2023-08-24 LAB — GLUCOSE, CAPILLARY
Glucose-Capillary: 101 mg/dL — ABNORMAL HIGH (ref 70–99)
Glucose-Capillary: 160 mg/dL — ABNORMAL HIGH (ref 70–99)
Glucose-Capillary: 147 mg/dL — ABNORMAL HIGH (ref 70–99)
Glucose-Capillary: 154 mg/dL — ABNORMAL HIGH (ref 70–99)

## 2023-08-24 NOTE — Progress Notes (Signed)
 Occupational Therapy Treatment Patient Details Name: [name] [name] MRN: [id number] DOB: [birth year] Today's Date: 08/24/2023   History of present illness Pt is an 88 y.o. female presenting to hospital 08/21/23 with c/o generalized weakness x4 weeks.  Hospitalization April for CHF exacerbation and generalized weakness; c/o L foot pain at that time--(-) fx via x-ray.  Pt admitted with NSTEMI, HFrEF, possible community acquired PNA.  PMH includes htn, h/o stroke, DM type 2, chronic systolic CHF, dementia, R breast CA, s/p R lumpectomy, B knee sx.   OT comments  Pt is seated on BSC on arrival. Pleasantly confused and agreeable to OT session. She does not mention pain, but admits to dizziness at 7/10 at end of session once seated in recliner-nurse notified as BP was not assessed. Pt performed STS from [facility] with Min/CGA to RW and Max A for peri-care after attempted urination/BM. Turned to sink counter and demo face washing with CGA with unilateral to no UE support on counter. Cueing needed for safety and location of items. Amb ~10 feet to recliner in room with cueing for safety and obstacle maneuvering to prevent fall.  Pt left seated in recliner with all needs in place and will cont to require skilled acute OT services to maximize her safety and IND to return to PLOF.       If plan is discharge home, recommend the following:  A lot of help with walking and/or transfers;A lot of help with bathing/dressing/bathroom;A little help with walking and/or transfers   Equipment Recommendations  Wheelchair (measurements OT);Hospital bed    Recommendations for Other Services      Precautions / Restrictions Precautions Precautions: Fall Recall of Precautions/Restrictions: Impaired Restrictions Weight Bearing Restrictions Per Provider Order: No       Mobility Bed Mobility               General bed mobility comments: NT on BSC pre/post session    Transfers Overall transfer level: Needs  assistance Equipment used: Rolling walker (2 wheels) Transfers: Sit to/from Stand Sit to Stand: Contact guard assist, Min assist           General transfer comment: once from [facility] and once from chair with arms in room; amb ~10 ft total during session with fatigue using RW and CGA     Balance Overall balance assessment: Needs assistance Sitting-balance support: No upper extremity supported, Feet supported Sitting balance-Leahy Scale: Good Sitting balance - Comments: steady reaching within BOS   Standing balance support: Bilateral upper extremity supported, Reliant on assistive device for balance Standing balance-Leahy Scale: Fair Standing balance comment: CGA requiring without UE support on RW                           ADL either performed or assessed with clinical judgement   ADL Overall ADL's : Needs assistance/impaired     Grooming: Wash/dry face;Standing;Contact guard assist Grooming Details (indicate cue type and reason): at sink counter with CGA, No UE support                 Toilet Transfer: Minimal assistance;Rolling walker (2 wheels);Contact guard assist Toilet Transfer Details (indicate cue type and reason): STS from [facility] and chair in room Toileting- Clothing Manipulation and Hygiene: Maximal assistance;Sit to/from stand Toileting - Clothing Manipulation Details (indicate cue type and reason): unsuccessful BM/urination on [facility]     Functional mobility during ADLs: Contact guard assist;Rolling walker (2 wheels)      Extremity/Trunk Assessment  Vision       Perception     Praxis     Communication Communication Communication: No apparent difficulties   Cognition Arousal: Alert Behavior During Therapy: Flat affect Cognition: Cognition impaired   Orientation impairments: Situation (grossly oriented but ?overall awareness)     Attention impairment (select first level of impairment): Selective attention Executive functioning  impairment (select all impairments): Reasoning OT - Cognition Comments: continues to state "my daughter needs to get me out of this place" "this place has a strange feeling to it, I feel like I'm under surveillance"                 Following commands: Impaired Following commands impaired: Follows one step commands with increased time      Cueing   Cueing Techniques: Verbal cues, Visual cues, Tactile cues  Exercises      Shoulder Instructions       General Comments 7/10 dizziness reported at end of session once therapist asked pt-nurse notified as BP was not monitored and did not seem to alter her mobility    Pertinent Vitals/ Pain       Pain Assessment Pain Assessment: No/denies pain  Home Living                                          Prior Functioning/Environment              Frequency  Min 2X/week        Progress Toward Goals  OT Goals(current goals can now be found in the care plan section)  Progress towards OT goals: Progressing toward goals  Acute Rehab OT Goals Patient Stated Goal: go home OT Goal Formulation: With patient Time For Goal Achievement: 09/05/23 Potential to Achieve Goals: Fair  Plan      Co-evaluation                 AM-PAC OT "6 Clicks" Daily Activity     Outcome Measure   Help from another person eating meals?: None Help from another person taking care of personal grooming?: None Help from another person toileting, which includes using toliet, bedpan, or urinal?: A Lot Help from another person bathing (including washing, rinsing, drying)?: A Lot Help from another person to put on and taking off regular upper body clothing?: A Little Help from another person to put on and taking off regular lower body clothing?: A Lot 6 Click Score: 17    End of Session Equipment Utilized During Treatment: Rolling walker (2 wheels);Gait belt  OT Visit Diagnosis: Other abnormalities of gait and mobility (R26.89)    Activity Tolerance Patient tolerated treatment well   Patient Left in chair;with call bell/phone within reach;with chair alarm set   Nurse Communication Mobility status        Time: 2956-2130 OT Time Calculation (min): 14 min  Charges: OT General Charges $OT Visit: 1 Visit OT Treatments $Self Care/Home Management : 8-22 mins  Benancio Osmundson, OTR/L  08/24/23, 1:49 PM   [name] [name] 08/24/2023, 1:46 PM

## 2023-08-24 NOTE — TOC Progression Note (Signed)
 Transition of Care [facility]) - Progression Note    Patient Details  Name: [name] [name] MRN: [id number] Date of Birth: 1935-06-15  Transition of Care [facility]) CM/SW Contact  Baird Bombard, RN Phone Number: 08/24/2023, 1:48 PM  Clinical Narrative:    Spoke with patient's daughter regarding discharge plans. She and patient would like SNF at [facility] or [facility]. Francoise Ishihara advised patient is nearing her co-payment days. She was advised copayment will be required at any facility she chooses. Francoise Ishihara stated pateint does have a secondary plan and [facility] is running the benefits.   Bed search started.    Expected Discharge Plan:  (TBD) Barriers to Discharge: Continued Medical Work up  Expected Discharge Plan and Services     Post Acute Care Choice:  (TBD) Living arrangements for the past 2 months: Independent Living Facility                           HH Arranged: RN, PT, OT [facility] Agency: [facility] Date HH Agency Contacted: 08/22/23   Representative spoke with at [facility] Agency: Shaun   Social Determinants of Health (SDOH) Interventions SDOH Screenings   Food Insecurity: No Food Insecurity (08/21/2023)  Housing: Low Risk  (08/21/2023)  Transportation Needs: No Transportation Needs (08/21/2023)  Utilities: Not At Risk (08/21/2023)  Financial Resource Strain: Low Risk  (02/21/2023)   Received from [facility]  Social Connections: Socially Isolated (08/21/2023)  Tobacco Use: Medium Risk (08/21/2023)    Readmission Risk Interventions    08/22/2023    2:18 PM  Readmission Risk Prevention Plan  Transportation Screening Complete  Medication Review (RN Care Manager) Complete  PCP or Specialist appointment within 3-5 days of discharge Complete  HRI or Home Care Consult Complete  SW Recovery Care/Counseling Consult Complete  Palliative Care Screening Not Applicable  Skilled Nursing Facility Not Complete  SNF Comments Will  complete tomorrow if she will meet 3-night inpatient stay.

## 2023-08-24 NOTE — NC FL2 (Signed)
 [place]  MEDICAID FL2 LEVEL OF CARE FORM     IDENTIFICATION  Patient Name: [name] [name] Birthdate: 03/17/1936 Sex: female Admission Date (Current Location): 08/21/2023  [facility] and [place] Number:  Chiropodist and Address:  [facility], [address], [place], [place] [address]      Provider Number: [id number]  Attending Physician Name and Address:  Janeane Mealy, MD  Relative Name and Phone Number:  Frederik Jansky (Daughter)  [phone] (Mobile)    Current Level of Care: Hospital Recommended Level of Care: Skilled Nursing Facility Prior Approval Number:    Date Approved/Denied:   PASRR Number: [id number]  Discharge Plan: SNF    Current Diagnoses: Patient Active Problem List   Diagnosis Date Noted   Ischemic cardiomyopathy 08/23/2023   HFrEF (heart failure with reduced ejection fraction) (HCC) 08/22/2023   NSTEMI (non-ST elevated myocardial infarction) (HCC) 08/21/2023   Pneumonia due to infectious organism 08/21/2023   Weakness 08/21/2023   Hypomagnesemia 07/15/2023   NSVT (nonsustained ventricular tachycardia) (HCC) 07/15/2023   Chronic HFrEF (heart failure with reduced ejection fraction) (HCC) 07/14/2023   Pulmonary hypertension, moderate to severe (HCC) 07/06/2023   Bilateral lower extremity edema 04/01/2023   Lightheadedness 04/01/2023   RVF (right ventricular failure) (HCC) 04/01/2023   Acute on chronic systolic CHF (congestive heart failure) (HCC) 03/31/2023   Acute on chronic systolic heart failure (HCC) 03/31/2023   History of CVA (cerebrovascular accident) 03/31/2023   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) 07/06/2020   Troponin level elevated 02/03/2018   Diabetes (HCC) 09/12/2017   Hyperlipidemia 09/12/2017   PAD (peripheral artery disease) (HCC) 06/09/2017   Essential hypertension 06/09/2014   H/O: duodenal ulcer 06/13/2013   Personal history of breast cancer 06/13/2013    Orientation  RESPIRATION BLADDER Height & Weight     Situation, Place, Time, Self  Normal Continent Weight: 72.8 kg Height:  5\' 5"  (165.1 cm)  BEHAVIORAL SYMPTOMS/MOOD NEUROLOGICAL BOWEL NUTRITION STATUS  Other (Comment) (n/a)  (n/a) Continent Diet (Heart)  AMBULATORY STATUS COMMUNICATION OF NEEDS Skin   Limited Assist Verbally Normal                       Personal Care Assistance Level of Assistance  Bathing, Dressing, Feeding Bathing Assistance: Limited assistance Feeding assistance: Limited assistance Dressing Assistance: Limited assistance     Functional Limitations Info             SPECIAL CARE FACTORS FREQUENCY  PT (By licensed PT), OT (By licensed OT)     PT Frequency: Min 2x weekly OT Frequency: Min 2x weekly            Contractures Contractures Info: Not present    Additional Factors Info  Code Status, Allergies Code Status Info: DNR Allergies Info: Codeine, Etodolac, Indomethacin, Penicillins, Tizanidine, Tramadol  Isolation: (none)  Code Status: DNR-Limited           Current Medications (08/24/2023):  This is the current hospital active medication list Current Facility-Administered Medications  Medication Dose Route Frequency Provider Last Rate Last Admin   acetaminophen  (TYLENOL ) tablet 650 mg  650 mg Oral Q6H PRN Amponsah, Prosper M, MD       Or   acetaminophen  (TYLENOL ) suppository 650 mg  650 mg Rectal Q6H PRN Amponsah, Prosper M, MD       aspirin  EC tablet 81 mg  81 mg Oral Daily Arida, Muhammad A, MD   81 mg at 08/24/23 0957   atorvastatin (  LIPITOR) tablet 40 mg  40 mg Oral Daily Arida, Muhammad A, MD   40 mg at 08/24/23 0957   clopidogrel (PLAVIX) tablet 75 mg  75 mg Oral Daily Arida, Muhammad A, MD   75 mg at 08/24/23 1478   colchicine  tablet 0.6 mg  0.6 mg Oral Daily PRN Amponsah, Prosper M, MD       dapagliflozin  propanediol (FARXIGA ) tablet 10 mg  10 mg Oral Daily Amponsah, Prosper M, MD   10 mg at 08/24/23 2956   enoxaparin  (LOVENOX ) injection 40  mg  40 mg Subcutaneous Q24H Janeane Mealy, MD   40 mg at 08/23/23 2206   insulin  aspart (novoLOG ) injection 0-15 Units  0-15 Units Subcutaneous TID WC Amponsah, Prosper M, MD   3 Units at 08/24/23 1235   insulin  aspart (novoLOG ) injection 0-5 Units  0-5 Units Subcutaneous QHS Amponsah, Prosper M, MD       loperamide (IMODIUM) capsule 2 mg  2 mg Oral PRN Vita Grip, MD       losartan  (COZAAR ) tablet 12.5 mg  12.5 mg Oral Daily Amponsah, Prosper M, MD   12.5 mg at 08/24/23 2130   memantine  (NAMENDA ) tablet 20 mg  20 mg Oral QPM Amponsah, Prosper M, MD   20 mg at 08/23/23 1620   metoprolol succinate (TOPROL-XL) 24 hr tablet 12.5 mg  12.5 mg Oral QHS End, Christopher, MD   12.5 mg at 08/23/23 2206   ondansetron  (ZOFRAN ) tablet 4 mg  4 mg Oral Q6H PRN Amponsah, Prosper M, MD       Or   ondansetron  (ZOFRAN ) injection 4 mg  4 mg Intravenous Q6H PRN Amponsah, Prosper M, MD       polyethylene glycol (MIRALAX  / GLYCOLAX ) packet 17 g  17 g Oral Daily Janeane Mealy, MD   17 g at 08/24/23 0957   risperiDONE (RISPERDAL) tablet 0.5 mg  0.5 mg Oral BID PRN Amponsah, Prosper M, MD       senna-docusate (Senokot-S) tablet 1 tablet  1 tablet Oral QHS PRN Vita Grip, MD       spironolactone  (ALDACTONE ) tablet 12.5 mg  12.5 mg Oral Daily Gildardo Labrador L, PA-C   12.5 mg at 08/24/23 8657     Discharge Medications: Please see discharge summary for a list of discharge medications.  Relevant Imaging Results:  Relevant Lab Results:   Additional Information SSN [id number]  Shyloh Derosa C Darcia Lampi, RN

## 2023-08-24 NOTE — Progress Notes (Signed)
 PT Cancellation Note  Patient Details Name: [name] [name] MRN: [id number] DOB: Aug 28, 1935   Cancelled Treatment:     Pt politely declined PT this pm after sitting up in the chair and just returning to bed. Will re-attempt next available date/time per POC.   Diona Franklin 08/24/2023, 4:50 PM

## 2023-08-24 NOTE — Plan of Care (Signed)
  Problem: Education: Goal: Knowledge of General Education information will improve Description: Including pain rating scale, medication(s)/side effects and non-pharmacologic comfort measures Outcome: Progressing   Problem: Health Behavior/Discharge Planning: Goal: Ability to manage health-related needs will improve Outcome: Progressing   Problem: Clinical Measurements: Goal: Ability to maintain clinical measurements within normal limits will improve Outcome: Progressing   Problem: Activity: Goal: Risk for activity intolerance will decrease Outcome: Progressing   Problem: Elimination: Goal: Will not experience complications related to bowel motility Outcome: Progressing   Problem: Skin Integrity: Goal: Risk for impaired skin integrity will decrease Outcome: Progressing   Problem: Coping: Goal: Ability to adjust to condition or change in health will improve Outcome: Progressing

## 2023-08-24 NOTE — Progress Notes (Signed)
 PROGRESS NOTE    [name] [name]  FAO:[id number] DOB: [birth year] DOA: 08/21/2023 PCP: Melchor Spoon, MD     Brief Narrative:   From admission h and p  [name] [name] is a 88 y.o. female with medical history significant for HFrEF, severe TR, type 2 diabetes, dementia, history of CVA, and hyperlipidemia who presents to the ED via EMS for evaluation of generalized weakness. Patient only oriented to self and person so history obtained from daughter. Per daughter, patient has been weak since her last hospitalization but then after completed 3 weeks of rehab. Last Friday, she started having left-sided chest pain so EMS was called. An EKG was done and patient was given nitro resolved, to the ED for further evaluation but patient declined.  Patient has continued to week and lethargic and occasionally agitated at night. She has had 2-3 falls in the past month due to significant weakness. Patient has had mild shortness of breath but no fevers, chills, dysuria, cough, diarrhea or constipation.   Assessment & Plan:   Principal Problem:   NSTEMI (non-ST elevated myocardial infarction) (HCC) Active Problems:   Diabetes (HCC)   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)   Essential hypertension   PAD (peripheral artery disease) (HCC)   History of CVA (cerebrovascular accident)   Chronic HFrEF (heart failure with reduced ejection fraction) (HCC)   Pneumonia due to infectious organism   Weakness   Pulmonary hypertension, moderate to severe (HCC)   HFrEF (heart failure with reduced ejection fraction) (HCC)   Ischemic cardiomyopathy  # NSTEMI Asymptomatic currently, troponin peak of 3400. Cardiology advises medical mgmt and patient has declined cath. Now s/p 48 hours IV heparin - dapt, coreg , statin  # HFrEF Biventricular failure, EF 25%, severe MR, pHTN. Has not followed up with advanced heart failure. Does not appear to be exacerbated. Prognosis poor - continue coreg , losartan ,  jardiance, spironolactone  - palliative consulted, declining hospice for now  # Pulmonary infiltrate No clinical signs/symptoms pneumonia - hold abx  # Dementia No behavioral disturbance. Resides at ALF  # Debility - PT/OT advising snf, daughter is interested, TOC aware  # T2DM Glucose controlled - continue ssi - hold metformin, given advanced systolic heart failure would be hesitant to resume that at discharge  # Hx CVA Nothing acute on CT of head/neck, appears to be at baseline - meds as above  # HTN Bp appropriate - continue current regimen   DVT prophylaxis: lovenox  Code Status: dnr/dni Family Communication: daughter gayle updated telephonically 5/15  Level of care: Telemetry Cardiac Status is: Inpatient Remains inpatient appropriate because: pending snf placement    Consultants:  Cardiology (signed off)  Procedures: none  Antimicrobials:  none    Subjective: Reports feeling fine, no cough or chest pain   Objective: Vitals:   08/24/23 0006 08/24/23 0411 08/24/23 0801 08/24/23 1140  BP: 95/82 125/82 (!) 134/94 118/85  Pulse: 79 83 72 81  Resp: 18 16 18    Temp: 97.6 F (36.4 C) 97.7 F (36.5 C) 97.6 F (36.4 C) 97.8 F (36.6 C)  TempSrc:      SpO2: 95% 97% 100% 100%  Weight:      Height:       No intake or output data in the 24 hours ending 08/24/23 1211  Filed Weights   08/21/23 1054  Weight: 72.8 kg    Examination:  General exam: Appears calm and comfortable  Respiratory system: Clear to auscultation save for rales at bases Cardiovascular system:  S1 & S2 heard, distant sounds, murmur present Gastrointestinal system: Abdomen is nondistended, soft and nontender.  Central nervous system: Alert and oriented to self and place, moving all 4 Extremities: Symmetric 5 x 5 power. Skin: No rashes, lesions or ulcers Psychiatry: calm     Data Reviewed: I have personally reviewed following labs and imaging studies  CBC: Recent Labs  Lab  08/21/23 1053 08/22/23 0524 08/23/23 0359  WBC 7.6 6.5 5.6  NEUTROABS 6.1  --   --   HGB 13.3 13.2 12.7  HCT 41.0 39.5 39.5  MCV 93.8 91.0 92.5  PLT 313 299 285   Basic Metabolic Panel: Recent Labs  Lab 08/21/23 1053 08/22/23 0524 08/23/23 0359 08/24/23 0358  NA 142 139 139 140  K 4.5 4.1 4.1 4.2  CL 111 109 111 109  CO2 19* 20* 19* 21*  GLUCOSE 177* 162* 153* 106*  BUN 26* 24* 28* 26*  CREATININE 1.09* 0.84 1.10* 1.07*  CALCIUM 9.0 8.8* 8.7* 9.2   GFR: Estimated Creatinine Clearance: 37.7 mL/min (A) (by C-G formula based on SCr of 1.07 mg/dL (H)). Liver Function Tests: Recent Labs  Lab 08/21/23 1053  AST 40  ALT 14  ALKPHOS 78  BILITOT 1.3*  PROT 7.2  ALBUMIN 3.4*   No results for input(s): "LIPASE", "AMYLASE" in the last 168 hours. No results for input(s): "AMMONIA" in the last 168 hours. Coagulation Profile: Recent Labs  Lab 08/21/23 1335  INR 1.6*   Cardiac Enzymes: No results for input(s): "CKTOTAL", "CKMB", "CKMBINDEX", "TROPONINI" in the last 168 hours. BNP (last 3 results) No results for input(s): "PROBNP" in the last 8760 hours. HbA1C: No results for input(s): "HGBA1C" in the last 72 hours. CBG: Recent Labs  Lab 08/23/23 1146 08/23/23 1620 08/23/23 2203 08/24/23 0800 08/24/23 1138  GLUCAP 151* 129* 101* 101* 160*   Lipid Profile: No results for input(s): "CHOL", "HDL", "LDLCALC", "TRIG", "CHOLHDL", "LDLDIRECT" in the last 72 hours. Thyroid  Function Tests: No results for input(s): "TSH", "T4TOTAL", "FREET4", "T3FREE", "THYROIDAB" in the last 72 hours. Anemia Panel: No results for input(s): "VITAMINB12", "FOLATE", "FERRITIN", "TIBC", "IRON", "RETICCTPCT" in the last 72 hours. Urine analysis:    Component Value Date/Time   COLORURINE AMBER (A) 08/23/2023 0117   APPEARANCEUR HAZY (A) 08/23/2023 0117   APPEARANCEUR Clear 03/12/2014 1000   LABSPEC 1.031 (H) 08/23/2023 0117   LABSPEC 1.018 03/12/2014 1000   PHURINE 5.0 08/23/2023 0117    GLUCOSEU NEGATIVE 08/23/2023 0117   GLUCOSEU Negative 03/12/2014 1000   HGBUR NEGATIVE 08/23/2023 0117   BILIRUBINUR NEGATIVE 08/23/2023 0117   BILIRUBINUR Negative 03/12/2014 1000   KETONESUR NEGATIVE 08/23/2023 0117   PROTEINUR >=300 (A) 08/23/2023 0117   NITRITE NEGATIVE 08/23/2023 0117   LEUKOCYTESUR SMALL (A) 08/23/2023 0117   LEUKOCYTESUR Trace 03/12/2014 1000   Sepsis Labs: @LABRCNTIP (procalcitonin:4,lacticidven:4)  ) Recent Results (from the past 240 hours)  Urine Culture     Status: Abnormal   Collection Time: 08/23/23  1:17 AM   Specimen: Urine, Random  Result Value Ref Range Status   Specimen Description   Final    URINE, RANDOM Performed at [facility], [address], [place], [place] [address]    Special Requests   Final    NONE Reflexed from [id number] Performed at [facility], [address] [place], [place] [address]    Culture (A)  Final    <10,000 COLONIES/mL INSIGNIFICANT GROWTH Performed at [facility], [address] [place], [place] [address]  Report Status 08/24/2023 FINAL  Final         Radiology Studies: No results found.       Scheduled Meds:  aspirin  EC  81 mg Oral Daily   atorvastatin  40 mg Oral Daily   clopidogrel  75 mg Oral Daily   dapagliflozin  propanediol  10 mg Oral Daily   enoxaparin  (LOVENOX ) injection  40 mg Subcutaneous Q24H   insulin  aspart  0-15 Units Subcutaneous TID WC   insulin  aspart  0-5 Units Subcutaneous QHS   losartan   12.5 mg Oral Daily   memantine   20 mg Oral QPM   metoprolol succinate  12.5 mg Oral QHS   polyethylene glycol  17 g Oral Daily   spironolactone   12.5 mg Oral Daily   Continuous Infusions:     LOS: 3 days     [name] Calico, MD [facility]   If 7PM-7AM, please contact night-coverage [email or web address] Password [facility] 08/24/2023, 12:11 PM

## 2023-08-24 NOTE — Plan of Care (Signed)
 PMT following.  Yesterday patient advised that she did not want me to speak with her family.  Updated by attending team today, that patient and family have decided against hospice care and would like rehab.  Patient with DNR and DNI status currently.  Would recommend outpatient palliative with transition to hospice care if and when patient and family would like to make this shift.

## 2023-08-25 DIAGNOSIS — I272 Pulmonary hypertension, unspecified: Secondary | ICD-10-CM

## 2023-08-25 DIAGNOSIS — I10 Essential (primary) hypertension: Secondary | ICD-10-CM

## 2023-08-25 DIAGNOSIS — I5020 Unspecified systolic (congestive) heart failure: Secondary | ICD-10-CM

## 2023-08-25 DIAGNOSIS — F0390 Unspecified dementia without behavioral disturbance: Secondary | ICD-10-CM

## 2023-08-25 DIAGNOSIS — I255 Ischemic cardiomyopathy: Secondary | ICD-10-CM

## 2023-08-25 DIAGNOSIS — E119 Type 2 diabetes mellitus without complications: Secondary | ICD-10-CM | POA: Diagnosis not present

## 2023-08-25 DIAGNOSIS — N1831 Chronic kidney disease, stage 3a: Secondary | ICD-10-CM

## 2023-08-25 DIAGNOSIS — I5022 Chronic systolic (congestive) heart failure: Secondary | ICD-10-CM | POA: Diagnosis not present

## 2023-08-25 DIAGNOSIS — J189 Pneumonia, unspecified organism: Secondary | ICD-10-CM | POA: Diagnosis not present

## 2023-08-25 DIAGNOSIS — I214 Non-ST elevation (NSTEMI) myocardial infarction: Secondary | ICD-10-CM | POA: Diagnosis not present

## 2023-08-25 DIAGNOSIS — Z8673 Personal history of transient ischemic attack (TIA), and cerebral infarction without residual deficits: Secondary | ICD-10-CM

## 2023-08-25 DIAGNOSIS — I739 Peripheral vascular disease, unspecified: Secondary | ICD-10-CM

## 2023-08-25 LAB — BASIC METABOLIC PANEL WITH GFR
Anion gap: 13 (ref 5–15)
BUN: 28 mg/dL — ABNORMAL HIGH (ref 8–23)
CO2: 16 mmol/L — ABNORMAL LOW (ref 22–32)
Calcium: 9.2 mg/dL (ref 8.9–10.3)
Chloride: 107 mmol/L (ref 98–111)
Creatinine, Ser: 1.03 mg/dL — ABNORMAL HIGH (ref 0.44–1.00)
GFR, Estimated: 53 mL/min — ABNORMAL LOW (ref >60)
Glucose, Bld: 154 mg/dL — ABNORMAL HIGH (ref 70–99)
Potassium: 4.9 mmol/L (ref 3.5–5.1)
Sodium: 136 mmol/L (ref 135–145)

## 2023-08-25 LAB — GLUCOSE, CAPILLARY
Glucose-Capillary: 158 mg/dL — ABNORMAL HIGH (ref 70–99)
Glucose-Capillary: 161 mg/dL — ABNORMAL HIGH (ref 70–99)
Glucose-Capillary: 121 mg/dL — ABNORMAL HIGH (ref 70–99)
Glucose-Capillary: 163 mg/dL — ABNORMAL HIGH (ref 70–99)

## 2023-08-25 NOTE — TOC Progression Note (Addendum)
 Transition of Care [facility]) - Progression Note    Patient Details  Name: [name] [name] MRN: [id number] Date of Birth: 10-19-1935  Transition of [facility]) CM/SW Contact  Baird Bombard, RN Phone Number: 08/25/2023, 10:19 AM  Clinical Narrative:    Eldora Greet with Fabian Holster from [facility] regarding SNF placement. He stated patient is in her co payment days. RNCM discussed secondary payor [employer] may not cover co payment. He will check with his supervisor to see if patient would be offer a bed.   Spoke with patient's daughter, Francoise Ishihara. She stated she had been in contact with [facility] and would have to pay patient's co payment. She is interested [facility] however realized they don't have private beds. She is requesting another search for a facility with private bed. Patient daughter advised She may search for facilities with private beds as that information is not provided in a general TOC bed search and is often not provided by the facility until day of discharge. Patient daughter stated she will search for a long term bed with a private room and let them RNCM know. Patient daughter was advised of CMS guideline for discharging to first available bed once discharge order is submitted. Gayle verbalzied her understanding.     Expected Discharge Plan:  (TBD) Barriers to Discharge: Continued Medical Work up  Expected Discharge Plan and Services     Post Acute Care Choice:  (TBD) Living arrangements for the past 2 months: Independent Living Facility                           HH Arranged: RN, PT, OT [facility] Agency: [facility] Date HH Agency Contacted: 08/22/23   Representative spoke with at [facility] Agency: Shaun   Social Determinants of Health (SDOH) Interventions SDOH Screenings   Food Insecurity: No Food Insecurity (08/21/2023)  Housing: Low Risk  (08/21/2023)  Transportation Needs: No Transportation Needs (08/21/2023)  Utilities: Not At Risk (08/21/2023)  Financial  Resource Strain: Low Risk  (02/21/2023)   Received from [facility]  Social Connections: Socially Isolated (08/21/2023)  Tobacco Use: Medium Risk (08/21/2023)    Readmission Risk Interventions    08/22/2023    2:18 PM  Readmission Risk Prevention Plan  Transportation Screening Complete  Medication Review (RN Care Manager) Complete  PCP or Specialist appointment within 3-5 days of discharge Complete  HRI or Home Care Consult Complete  SW Recovery Care/Counseling Consult Complete  Palliative Care Screening Not Applicable  Skilled Nursing Facility Not Complete  SNF Comments Will complete tomorrow if she will meet 3-night inpatient stay.

## 2023-08-25 NOTE — Progress Notes (Signed)
 PROGRESS NOTE    [name] [name]  UEA:[id number] DOB: 1936/01/09 DOA: 08/21/2023 PCP: Melchor Spoon, MD  Chief Complaint  Patient presents with   Weakness    Hospital Course:  [name] with heart failure reduced EF, severe TR, type 2 diabetes, dementia, history of prior CVA, hyperlipidemia, who presented to the ED for generalized weakness.  Patient had recent EMS for chest pain and at that time required nitro for chest pain to resolve, she declined ED evaluation at that time.  On this admission patient was diagnosed with NSTEMI.  Cardiology was consulted.  Patient's troponin peaked at 3400.  Ultimately patient declined cath and cardiology advised medical management.  Patient completed 48 hours of IV heparin.  She has been continued on DAPT, Coreg , statin.  She does have advanced heart failure with an EF of 25% and severe mitral regurg.  Hide of care was consulted given extensive comorbidities and declining complete workup.  Ultimately patient was not ready for hospice but wants to proceed with palliative care outpatient.  TOC was consulted for SNF placement.  Subjective: Has no acute complaints this morning.  She is fatigued but denies any pain.   Objective: Vitals:   08/24/23 2306 08/25/23 0443 08/25/23 0447 08/25/23 0752  BP: 119/69 120/75 123/72 105/62  Pulse: 71 69 66 72  Resp: 20 20 20 18   Temp: 98.7 F (37.1 C) 97.7 F (36.5 C) 97.7 F (36.5 C) (!) 97.5 F (36.4 C)  TempSrc:      SpO2: 94% 99% 100% 91%  Weight:      Height:       No intake or output data in the 24 hours ending 08/25/23 1006 Filed Weights   08/21/23 1054  Weight: 72.8 kg    Examination: General exam: Appears calm and comfortable, frail, appears fatigued Respiratory system: No work of breathing, symmetric chest wall expansion Cardiovascular system: S1 & S2 heard, RRR.  Gastrointestinal system: Abdomen is nondistended, soft and nontender.  Neuro: Drowsy but arousable.  Clear speech when spoken  to.  Does require some prompting Extremities: Symmetric, expected ROM Skin: No rashes, lesions Psychiatry: Demonstrates appropriate judgement and insight. Mood & affect appropriate for situation.   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)   Essential hypertension   PAD (peripheral artery disease) (HCC)   History of CVA (cerebrovascular accident)   Chronic HFrEF (heart failure with reduced ejection fraction) (HCC)   Pneumonia due to infectious organism   Weakness   Pulmonary hypertension, moderate to severe (HCC)   HFrEF (heart failure with reduced ejection fraction) (HCC)   Ischemic cardiomyopathy    NSTEMI - Troponin peak 3400 - Patient declined cath - Status post 48 hours IV heparin - Continue DAPT, Coreg , statin - Cardiology has now signed off  Heart failure reduced EF - Biventricular failure, EF 25%, severe MR, pulmonary hypertension - Has not followed with advanced heart failure outpatient - Not currently in exacerbation but prognosis is poor - Continue with GDMT as BP tolerates  Dementia without behavioral disturbance - Resides permanently at assisted living facility, will need higher level of care at discharge  Debility - Physical therapy/Occupational Therapy recommending SNF - TOC has been consulted for SNF placement  Type 2 diabetes - Glucose is controlled at this time, continue close monitoring of CBGs - Sliding scale insulin , titrate daily - Hold metformin while  admitted.  Given advanced systolic heart failure unlikely we will resume at discharge  History of CVA - Appears to be at her current baseline - Continue home medications with risk factor control as above  Hypertension - Continue to monitor blood pressures closely, titrate medications as needed - Current regimen appears to be stable.  Will continue for  now  Poor prognosis - In light of patient's multiple comorbidities including biventricular heart failure with reduced EF, pulmonary hypertension, NSTEMI requiring medical management, dementia, and generalized debility patient is an excellent candidate for hospice.  Palliative care has been consulted and spoken with the patient and her family.  At this time they are declining hospice but they are interested in palliative care outpatient.  They will transition to hospice care at a later time on an outpatient basis if they so desire  DVT prophylaxis: Lovenox    Code Status: Limited: Do not attempt resuscitation (DNR) -DNR-LIMITED -Do Not Intubate/DNI  Disposition: TOC consulted to assist in outpatient arrangements.  Medically cleared for discharge when this is arranged  Consultants:    Procedures:    Antimicrobials:  Anti-infectives (From admission, onward)    Start     Dose/Rate Route Frequency Ordered Stop   08/22/23 1200  cefTRIAXone (ROCEPHIN) 1 g in sodium chloride  0.9 % 100 mL IVPB  Status:  Discontinued        1 g 200 mL/hr over 30 Minutes Intravenous Every 24 hours 08/21/23 2130 08/22/23 0851   08/22/23 0400  doxycycline (VIBRA-TABS) tablet 100 mg  Status:  Discontinued        100 mg Oral Every 12 hours 08/21/23 2130 08/22/23 0851   08/21/23 1200  cefTRIAXone (ROCEPHIN) 1 g in sodium chloride  0.9 % 100 mL IVPB        1 g 200 mL/hr over 30 Minutes Intravenous  Once 08/21/23 1152 08/21/23 1607   08/21/23 1200  doxycycline (VIBRAMYCIN) 100 mg in sodium chloride  0.9 % 250 mL IVPB        100 mg 125 mL/hr over 120 Minutes Intravenous  Once 08/21/23 1152 08/21/23 1813       Data Reviewed: I have personally reviewed following labs and imaging studies CBC: Recent Labs  Lab 08/21/23 1053 08/22/23 0524 08/23/23 0359  WBC 7.6 6.5 5.6  NEUTROABS 6.1  --   --   HGB 13.3 13.2 12.7  HCT 41.0 39.5 39.5  MCV 93.8 91.0 92.5  PLT 313 299 285   Basic Metabolic Panel: Recent Labs  Lab  08/21/23 1053 08/22/23 0524 08/23/23 0359 08/24/23 0358 08/25/23 0428  NA 142 139 139 140 136  K 4.5 4.1 4.1 4.2 4.9  CL 111 109 111 109 107  CO2 19* 20* 19* 21* 16*  GLUCOSE 177* 162* 153* 106* 154*  BUN 26* 24* 28* 26* 28*  CREATININE 1.09* 0.84 1.10* 1.07* 1.03*  CALCIUM 9.0 8.8* 8.7* 9.2 9.2   GFR: Estimated Creatinine Clearance: 39.2 mL/min (A) (by C-G formula based on SCr of 1.03 mg/dL (H)). Liver Function Tests: Recent Labs  Lab 08/21/23 1053  AST 40  ALT 14  ALKPHOS 78  BILITOT 1.3*  PROT 7.2  ALBUMIN 3.4*   CBG: Recent Labs  Lab 08/24/23 0800 08/24/23 1138 08/24/23 1611 08/24/23 2104 08/25/23 0753  GLUCAP 101* 160* 147* 154* 158*    Recent Results (from the past 240 hours)  Urine Culture     Status: Abnormal   Collection Time: 08/23/23  1:17 AM   Specimen: Urine, Random  Result  Value Ref Range Status   Specimen Description   Final    URINE, RANDOM Performed at [facility], [address] [place], [place] [address]    Special Requests   Final    NONE Reflexed from [id number] Performed at [facility], [address] [place], [place] [address]    Culture (A)  Final    <10,000 COLONIES/mL INSIGNIFICANT GROWTH Performed at [facility], [address] [place], [place] [address]    Report Status 08/24/2023 FINAL  Final     Radiology Studies: No results found.  Scheduled Meds:  aspirin  EC  81 mg Oral Daily   atorvastatin  40 mg Oral Daily   clopidogrel  75 mg Oral Daily   dapagliflozin  propanediol  10 mg Oral Daily   enoxaparin  (LOVENOX ) injection  40 mg Subcutaneous Q24H   insulin  aspart  0-15 Units Subcutaneous TID WC   insulin  aspart  0-5 Units Subcutaneous QHS   losartan   12.5 mg Oral Daily   memantine   20 mg Oral QPM   metoprolol succinate  12.5 mg Oral QHS   polyethylene glycol  17 g Oral Daily   spironolactone   12.5 mg Oral Daily   Continuous Infusions:   LOS: 4 days  MDM: Patient is high risk for one  or more organ failure.  They necessitate ongoing hospitalization for continued IV therapies and subsequent lab monitoring. Total time spent interpreting labs and vitals, reviewing the medical record, coordinating care amongst consultants and care team members, directly assessing and discussing care with the patient and/or family: 55 min  Jaxsin Bottomley, DO [facility]  To contact the attending physician between 7A-7P please use Epic Chat. To contact the covering physician during after hours 7P-7A, please review Amion.  08/25/2023, 10:06 AM   *This document has been created with the assistance of dictation software. Please excuse typographical errors. *

## 2023-08-25 NOTE — Progress Notes (Signed)
 PT Cancellation Note  Patient Details Name: [name] [name] MRN: [id number] DOB: 1935/10/13   Cancelled Treatment:     Pt adamantly refused several attempts to mobilize or participate in skilled PT despite education on benefits. Will re-attempt per POC.   Diona Franklin 08/25/2023, 1:09 PM

## 2023-08-25 NOTE — Plan of Care (Signed)

## 2023-08-26 DIAGNOSIS — E119 Type 2 diabetes mellitus without complications: Secondary | ICD-10-CM | POA: Diagnosis not present

## 2023-08-26 DIAGNOSIS — I5022 Chronic systolic (congestive) heart failure: Secondary | ICD-10-CM | POA: Diagnosis not present

## 2023-08-26 DIAGNOSIS — I214 Non-ST elevation (NSTEMI) myocardial infarction: Secondary | ICD-10-CM | POA: Diagnosis not present

## 2023-08-26 DIAGNOSIS — F0390 Unspecified dementia without behavioral disturbance: Secondary | ICD-10-CM | POA: Diagnosis not present

## 2023-08-26 LAB — GLUCOSE, CAPILLARY
Glucose-Capillary: 133 mg/dL — ABNORMAL HIGH (ref 70–99)
Glucose-Capillary: 121 mg/dL — ABNORMAL HIGH (ref 70–99)
Glucose-Capillary: 160 mg/dL — ABNORMAL HIGH (ref 70–99)
Glucose-Capillary: 78 mg/dL (ref 70–99)

## 2023-08-26 LAB — BASIC METABOLIC PANEL WITH GFR
Anion gap: 10 (ref 5–15)
BUN: 26 mg/dL — ABNORMAL HIGH (ref 8–23)
CO2: 21 mmol/L — ABNORMAL LOW (ref 22–32)
Calcium: 9.1 mg/dL (ref 8.9–10.3)
Chloride: 105 mmol/L (ref 98–111)
Creatinine, Ser: 0.98 mg/dL (ref 0.44–1.00)
GFR, Estimated: 56 mL/min — ABNORMAL LOW (ref >60)
Glucose, Bld: 122 mg/dL — ABNORMAL HIGH (ref 70–99)
Potassium: 4.7 mmol/L (ref 3.5–5.1)
Sodium: 136 mmol/L (ref 135–145)

## 2023-08-26 MED ORDER — RISPERIDONE 0.5 MG PO TABS
0.5000 mg | ORAL_TABLET | Freq: Every day | ORAL | Status: DC
  Administered 2023-08-26: 0.5 mg via ORAL
  Filled 2023-08-26 (×3): qty 1

## 2023-08-26 NOTE — Progress Notes (Addendum)
 PROGRESS NOTE    [name] [name]  UEA:[id number] DOB: 06/22/1935 DOA: 08/21/2023 PCP: Melchor Spoon, MD  Chief Complaint  Patient presents with   Weakness    Hospital Course:  [name] [name] with heart failure reduced EF, severe TR, type 2 diabetes, dementia, history of prior CVA, hyperlipidemia, who presented to the ED for generalized weakness.  Patient had recent EMS for chest pain and at that time required nitro for chest pain to resolve, she declined ED evaluation at that time.  On this admission patient was diagnosed with NSTEMI.  Cardiology was consulted.  Patient's troponin peaked at 3400.  Ultimately patient declined cath and cardiology advised medical management.  Patient completed 48 hours of IV heparin .  She has been continued on DAPT, Coreg , statin.  She does have advanced heart failure with an EF of 25% and severe mitral regurg.  Hide of care was consulted given extensive comorbidities and declining complete workup.  Ultimately patient was not ready for hospice but wants to proceed with palliative care outpatient.  TOC was consulted for SNF placement.  Subjective: No acute events overnight.  She has no acute complaints this morning.   Objective: Vitals:   08/26/23 0453 08/26/23 0730 08/26/23 1107 08/26/23 1513  BP: (!) 128/91 120/83 117/74 (!) 117/95  Pulse: 70 71 68 97  Resp: 18 18 18 18   Temp: (!) 97.5 F (36.4 C) 97.9 F (36.6 C) 97.6 F (36.4 C) 97.8 F (36.6 C)  TempSrc: Oral     SpO2: 96% 98% 99% 100%  Weight:      Height:        Intake/Output Summary (Last 24 hours) at 08/26/2023 1657 Last data filed at 08/26/2023 1423 Gross per 24 hour  Intake 0 ml  Output --  Net 0 ml   Filed Weights   08/21/23 1054  Weight: 72.8 kg    Examination: General exam: Appears calm and comfortable, frail, appears fatigued Respiratory system: No work of breathing, symmetric chest wall expansion Cardiovascular system: S1 & S2 heard, RRR.  Gastrointestinal system: Abdomen  is nondistended, soft and nontender.  Neuro: Alert and oriented. Extremities: Symmetric, expected ROM Skin: No rashes, lesions Psychiatry: Demonstrates appropriate judgement and insight. Mood & affect appropriate for situation.   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)   Essential hypertension   PAD (peripheral artery disease) (HCC)   History of CVA (cerebrovascular accident)   Chronic HFrEF (heart failure with reduced ejection fraction) (HCC)   Pneumonia due to infectious organism   Weakness   Pulmonary hypertension, moderate to severe (HCC)   HFrEF (heart failure with reduced ejection fraction) (HCC)   Ischemic cardiomyopathy    NSTEMI - Troponin peak 3400 - Patient declined cath - Status post 48 hours IV heparin  - Continue DAPT, Coreg , statin - Cardiology has signed off.  Will reengage if new cardiac concern arises  Heart failure reduced EF - Biventricular failure, EF 25%, severe MR, pulmonary hypertension - Has not followed with advanced heart failure outpatient - Does not appear to be in exacerbation currently - Continue with GDMT as BP tolerates - Continue daily assessment of volume status  Dementia without behavioral disturbance - Resides permanently at assisted living facility, will need higher level of care at discharge - [facility] consulted and working on placement  Debility - Physical therapy/Occupational Therapy recommending SNF - TOC has been  consulted for SNF placement  Type 2 diabetes - Continue daily Monitoring of CBGs, titrate insulin  needs per results - Hold metformin  while admitted.  Given advanced systolic heart failure unlikely we will not resume at discharge  History of CVA - Appears to be at her current baseline - Continue home meds.  Risperdal  on at bedtime per request  Hypertension - Blood pressures  closely.  Titrate meds as needed. - Today blood pressures appear to be at goal  Poor prognosis - In light of patient's multiple comorbidities including biventricular heart failure with reduced EF, pulmonary hypertension, NSTEMI requiring medical management, dementia, and generalized debility patient is an excellent candidate for hospice.  Palliative care has been consulted and spoken with the patient and her family.  At this time they are declining hospice but they are interested in palliative care outpatient.  They will transition to hospice care at a later time on an outpatient basis if they so desire  DVT prophylaxis: Lovenox    Code Status: Limited: Do not attempt resuscitation (DNR) -DNR-LIMITED -Do Not Intubate/DNI  Disposition: TOC consulted to assist in outpatient arrangements.  Medically cleared for discharge when this is arranged  Consultants:    Procedures:    Antimicrobials:  Anti-infectives (From admission, onward)    Start     Dose/Rate Route Frequency Ordered Stop   08/22/23 1200  cefTRIAXone  (ROCEPHIN ) 1 g in sodium chloride  0.9 % 100 mL IVPB  Status:  Discontinued        1 g 200 mL/hr over 30 Minutes Intravenous Every 24 hours 08/21/23 2130 08/22/23 0851   08/22/23 0400  doxycycline  (VIBRA -TABS) tablet 100 mg  Status:  Discontinued        100 mg Oral Every 12 hours 08/21/23 2130 08/22/23 0851   08/21/23 1200  cefTRIAXone  (ROCEPHIN ) 1 g in sodium chloride  0.9 % 100 mL IVPB        1 g 200 mL/hr over 30 Minutes Intravenous  Once 08/21/23 1152 08/21/23 1607   08/21/23 1200  doxycycline  (VIBRAMYCIN ) 100 mg in sodium chloride  0.9 % 250 mL IVPB        100 mg 125 mL/hr over 120 Minutes Intravenous  Once 08/21/23 1152 08/21/23 1813       Data Reviewed: I have personally reviewed following labs and imaging studies CBC: Recent Labs  Lab 08/21/23 1053 08/22/23 0524 08/23/23 0359  WBC 7.6 6.5 5.6  NEUTROABS 6.1  --   --   HGB 13.3 13.2 12.7  HCT 41.0 39.5 39.5  MCV  93.8 91.0 92.5  PLT 313 299 285   Basic Metabolic Panel: Recent Labs  Lab 08/22/23 0524 08/23/23 0359 08/24/23 0358 08/25/23 0428 08/26/23 0658  NA 139 139 140 136 136  K 4.1 4.1 4.2 4.9 4.7  CL 109 111 109 107 105  CO2 20* 19* 21* 16* 21*  GLUCOSE 162* 153* 106* 154* 122*  BUN 24* 28* 26* 28* 26*  CREATININE 0.84 1.10* 1.07* 1.03* 0.98  CALCIUM  8.8* 8.7* 9.2 9.2 9.1   GFR: Estimated Creatinine Clearance: 41.2 mL/min (by C-G formula based on SCr of 0.98 mg/dL). Liver Function Tests: Recent Labs  Lab 08/21/23 1053  AST 40  ALT 14  ALKPHOS 78  BILITOT 1.3*  PROT 7.2  ALBUMIN 3.4*   CBG: Recent Labs  Lab 08/25/23 1709 08/25/23 2115 08/26/23 0731 08/26/23 1108 08/26/23 1533  GLUCAP 121* 163* 133* 121* 160*    Recent Results (from the past 240 hours)  Urine Culture  Status: Abnormal   Collection Time: 08/23/23  1:17 AM   Specimen: Urine, Random  Result Value Ref Range Status   Specimen Description   Final    URINE, RANDOM Performed at [facility], [address] [place], [place] [address]    Special Requests   Final    NONE Reflexed from [id number] Performed at [facility], [address] [place], [place] [address]    Culture (A)  Final    <10,000 COLONIES/mL INSIGNIFICANT GROWTH Performed at [facility], [address] [place], [place] [address]    Report Status 08/24/2023 FINAL  Final     Radiology Studies: No results found.  Scheduled Meds:  aspirin  EC  81 mg Oral Daily   atorvastatin   40 mg Oral Daily   clopidogrel   75 mg Oral Daily   dapagliflozin  propanediol  10 mg Oral Daily   enoxaparin  (LOVENOX ) injection  40 mg Subcutaneous Q24H   insulin  aspart  0-15 Units Subcutaneous TID WC   insulin  aspart  0-5 Units Subcutaneous QHS   losartan   12.5 mg Oral Daily   memantine   20 mg Oral QPM   metoprolol  succinate  12.5 mg Oral QHS   polyethylene glycol  17 g Oral Daily   risperiDONE   0.5 mg Oral QHS    spironolactone   12.5 mg Oral Daily   Continuous Infusions:   LOS: 5 days  MDM: Patient is high risk for one or more organ failure.  They necessitate ongoing hospitalization for continued IV therapies and subsequent lab monitoring. Total time spent interpreting labs and vitals, reviewing the medical record, coordinating care amongst consultants and care team members, directly assessing and discussing care with the patient and/or family: 55 min  Deagen Krass, DO [facility]  To contact the attending physician between 7A-7P please use Epic Chat. To contact the covering physician during after hours 7P-7A, please review Amion.  08/26/2023, 4:57 PM   *This document has been created with the assistance of dictation software. Please excuse typographical errors. *

## 2023-08-26 NOTE — Plan of Care (Signed)

## 2023-08-27 DIAGNOSIS — I214 Non-ST elevation (NSTEMI) myocardial infarction: Secondary | ICD-10-CM | POA: Diagnosis not present

## 2023-08-27 LAB — BASIC METABOLIC PANEL WITH GFR
Anion gap: 14 (ref 5–15)
BUN: 25 mg/dL — ABNORMAL HIGH (ref 8–23)
CO2: 17 mmol/L — ABNORMAL LOW (ref 22–32)
Calcium: 9.1 mg/dL (ref 8.9–10.3)
Chloride: 105 mmol/L (ref 98–111)
Creatinine, Ser: 1.08 mg/dL — ABNORMAL HIGH (ref 0.44–1.00)
GFR, Estimated: 50 mL/min — ABNORMAL LOW (ref >60)
Glucose, Bld: 84 mg/dL (ref 70–99)
Potassium: 4.6 mmol/L (ref 3.5–5.1)
Sodium: 136 mmol/L (ref 135–145)

## 2023-08-27 LAB — GLUCOSE, CAPILLARY
Glucose-Capillary: 73 mg/dL (ref 70–99)
Glucose-Capillary: 99 mg/dL (ref 70–99)
Glucose-Capillary: 96 mg/dL (ref 70–99)

## 2023-08-27 MED ORDER — RISPERIDONE 0.25 MG PO TABS
0.2500 mg | ORAL_TABLET | Freq: Every day | ORAL | Status: DC
  Administered 2023-08-27 – 2023-08-30 (×4): 0.25 mg via ORAL
  Filled 2023-08-27 (×4): qty 1

## 2023-08-27 MED ORDER — HALOPERIDOL LACTATE 5 MG/ML IJ SOLN
1.0000 mg | Freq: Once | INTRAMUSCULAR | Status: AC
  Administered 2023-08-27: 1 mg via INTRAVENOUS
  Filled 2023-08-27: qty 1

## 2023-08-27 NOTE — Progress Notes (Signed)
 PROGRESS NOTE    [name] [name]  UJW:[id number] DOB: [birth year] DOA: 08/21/2023 PCP: Melchor Spoon, MD  Chief Complaint  Patient presents with   Weakness    Hospital Course:  Roxi Aeschliman with heart failure reduced EF, severe TR, type 2 diabetes, dementia, history of prior CVA, hyperlipidemia, who presented to the ED for generalized weakness.  Patient had recent EMS for chest pain and at that time required nitro for chest pain to resolve, she declined ED evaluation at that time.  On this admission patient was diagnosed with NSTEMI.  Cardiology was consulted.  Patient's troponin peaked at 3400.  Ultimately patient declined cath and cardiology advised medical management.  Patient completed 48 hours of IV heparin .  She has been continued on DAPT, Coreg , statin.  She does have advanced heart failure with an EF of 25% and severe mitral regurg.  Hide of care was consulted given extensive comorbidities and declining complete workup.  Ultimately patient was not ready for hospice but wants to proceed with palliative care outpatient.  TOC was consulted for SNF placement.  Subjective: No acute events overnight.  Sleeping easily on exam.  Has no acute complaints this morning.   Objective: Vitals:   08/26/23 2105 08/27/23 0107 08/27/23 0908 08/27/23 1248  BP:  114/76 107/70 (!) 90/52  Pulse: 72 (!) 107 66 65  Resp: 16 20    Temp: 97.9 F (36.6 C) (!) 97.4 F (36.3 C) 97.8 F (36.6 C) 97.7 F (36.5 C)  TempSrc:    Oral  SpO2: 100% 96% 100% 99%  Weight:      Height:       No intake or output data in the 24 hours ending 08/27/23 1605  Filed Weights   08/21/23 1054  Weight: 72.8 kg    Examination: General exam: Appears calm and comfortable, frail, resting easily Respiratory system: No work of breathing, symmetric chest wall expansion Cardiovascular system: S1 & S2 heard, RRR.  Gastrointestinal system: Abdomen is nondistended, soft and nontender.  Extremities: Symmetric, expected  ROM Skin: No rashes, lesions Psychiatry: Calm. Mood & affect appropriate for situation.   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)   Essential hypertension   PAD (peripheral artery disease) (HCC)   History of CVA (cerebrovascular accident)   Chronic HFrEF (heart failure with reduced ejection fraction) (HCC)   Pneumonia due to infectious organism   Weakness   Pulmonary hypertension, moderate to severe (HCC)   HFrEF (heart failure with reduced ejection fraction) (HCC)   Ischemic cardiomyopathy    NSTEMI - Troponin peak 3400 - Patient declined cath - Status post 48 hours IV heparin  - Continue DAPT, Coreg , statin - Cardiology has signed off.  Will reengage if new cardiac concern arises  Heart failure reduced EF - Biventricular failure, EF 25%, severe MR, pulmonary hypertension - Has not followed with advanced heart failure outpatient - No exacerbation currently - Continue with GDMT as BP tolerates - Continue daily assessment of volume status.  Currently euvolemic.  Dementia without behavioral disturbance - Resides permanently at assisted living facility, will need higher level of care at discharge - [facility] consulted and working on placement  Debility - Physical therapy/Occupational Therapy recommending SNF - TOC has been consulted for SNF placement  Type 2 diabetes - Monitoring daily CBG.  No insulin  needed.  - Currently CBG consistently under 100.  Will  discontinue q4h checks - Hemoglobin A1c in AM - Hold metformin  while admitted.  Given advanced systolic heart failure unlikely we will not resume at discharge  History of CVA - Appears to be at her current baseline - Continue home meds.  Risperdal  on at bedtime per request  Hypertension - Blood pressures closely.  Titrate meds as needed. - Today blood pressures appear to be at  goal  Poor prognosis - In light of patient's multiple comorbidities including biventricular heart failure with reduced EF, pulmonary hypertension, NSTEMI requiring medical management, dementia, and generalized debility patient is an excellent candidate for hospice.  Palliative care has been consulted and spoken with the patient and her family.  At this time they are declining hospice but they are interested in palliative care outpatient.  They will transition to hospice care at a later time on an outpatient basis if they so desire  DVT prophylaxis: Lovenox    Code Status: Limited: Do not attempt resuscitation (DNR) -DNR-LIMITED -Do Not Intubate/DNI  Disposition: TOC consulted to assist in outpatient arrangements.  Medically cleared for discharge when this is arranged  Consultants:    Procedures:    Antimicrobials:  Anti-infectives (From admission, onward)    Start     Dose/Rate Route Frequency Ordered Stop   08/22/23 1200  cefTRIAXone  (ROCEPHIN ) 1 g in sodium chloride  0.9 % 100 mL IVPB  Status:  Discontinued        1 g 200 mL/hr over 30 Minutes Intravenous Every 24 hours 08/21/23 2130 08/22/23 0851   08/22/23 0400  doxycycline  (VIBRA -TABS) tablet 100 mg  Status:  Discontinued        100 mg Oral Every 12 hours 08/21/23 2130 08/22/23 0851   08/21/23 1200  cefTRIAXone  (ROCEPHIN ) 1 g in sodium chloride  0.9 % 100 mL IVPB        1 g 200 mL/hr over 30 Minutes Intravenous  Once 08/21/23 1152 08/21/23 1607   08/21/23 1200  doxycycline  (VIBRAMYCIN ) 100 mg in sodium chloride  0.9 % 250 mL IVPB        100 mg 125 mL/hr over 120 Minutes Intravenous  Once 08/21/23 1152 08/21/23 1813       Data Reviewed: I have personally reviewed following labs and imaging studies CBC: Recent Labs  Lab 08/21/23 1053 08/22/23 0524 08/23/23 0359  WBC 7.6 6.5 5.6  NEUTROABS 6.1  --   --   HGB 13.3 13.2 12.7  HCT 41.0 39.5 39.5  MCV 93.8 91.0 92.5  PLT 313 299 285   Basic Metabolic Panel: Recent Labs   Lab 08/23/23 0359 08/24/23 0358 08/25/23 0428 08/26/23 0658 08/27/23 0328  NA 139 140 136 136 136  K 4.1 4.2 4.9 4.7 4.6  CL 111 109 107 105 105  CO2 19* 21* 16* 21* 17*  GLUCOSE 153* 106* 154* 122* 84  BUN 28* 26* 28* 26* 25*  CREATININE 1.10* 1.07* 1.03* 0.98 1.08*  CALCIUM  8.7* 9.2 9.2 9.1 9.1   GFR: Estimated Creatinine Clearance: 37.4 mL/min (A) (by C-G formula based on SCr of 1.08 mg/dL (H)). Liver Function Tests: Recent Labs  Lab 08/21/23 1053  AST 40  ALT 14  ALKPHOS 78  BILITOT 1.3*  PROT 7.2  ALBUMIN 3.4*   CBG: Recent Labs  Lab 08/26/23 1108 08/26/23 1533 08/26/23 2214 08/27/23 0902 08/27/23 1242  GLUCAP 121* 160* 78 73 99    Recent Results (from the past 240 hours)  Urine Culture     Status: Abnormal   Collection Time: 08/23/23  1:17 AM   Specimen: Urine, Random  Result Value Ref Range Status   Specimen Description   Final    URINE, RANDOM Performed at [facility], [address], [place], [place] [address]    Special Requests   Final    NONE Reflexed from [id number] Performed at [facility], [address] [place], [place] [address]    Culture (A)  Final    <10,000 COLONIES/mL INSIGNIFICANT GROWTH Performed at [facility], [address], [place], [place] [address]    Report Status 08/24/2023 FINAL  Final     Radiology Studies: No results found.  Scheduled Meds:  aspirin  EC  81 mg Oral Daily   atorvastatin   40 mg Oral Daily   clopidogrel   75 mg Oral Daily   dapagliflozin  propanediol  10 mg Oral Daily   enoxaparin  (LOVENOX ) injection  40 mg Subcutaneous Q24H   insulin  aspart  0-15 Units Subcutaneous TID WC   insulin  aspart  0-5 Units Subcutaneous QHS   losartan   12.5 mg Oral Daily   memantine   20 mg Oral QPM   metoprolol  succinate  12.5 mg Oral QHS   polyethylene glycol  17 g Oral Daily   risperiDONE   0.5 mg Oral QHS   spironolactone   12.5 mg Oral Daily   Continuous Infusions:   LOS: 6 days  MDM:  Patient is high risk for one or more organ failure.  They necessitate ongoing hospitalization for continued IV therapies and subsequent lab monitoring. Total time spent interpreting labs and vitals, reviewing the medical record, coordinating care amongst consultants and care team members, directly assessing and discussing care with the patient and/or family: 55 min  Raiya Stainback, DO [facility]  To contact the attending physician between 7A-7P please use Epic Chat. To contact the covering physician during after hours 7P-7A, please review Amion.  08/27/2023, 4:05 PM   *This document has been created with the assistance of dictation software. Please excuse typographical errors. *

## 2023-08-27 NOTE — Plan of Care (Signed)
  Problem: Education: Goal: Knowledge of General Education information will improve Description: Including pain rating scale, medication(s)/side effects and non-pharmacologic comfort measures Outcome: Not Progressing   Problem: Clinical Measurements: Goal: Ability to maintain clinical measurements within normal limits will improve Outcome: Not Progressing   

## 2023-08-27 NOTE — Progress Notes (Signed)
 Occupational Therapy Treatment Patient Details Name: [name] [name] MRN: [id number] DOB: 30-Mar-1936 Today's Date: 08/27/2023   History of present illness Pt is an 88 y.o. female presenting to hospital 08/21/23 with c/o generalized weakness x4 weeks.  Hospitalization April for CHF exacerbation and generalized weakness; c/o L foot pain at that time--(-) fx via x-ray.  Pt admitted with NSTEMI, HFrEF, possible community acquired PNA.  PMH includes htn, h/o stroke, DM type 2, chronic systolic CHF, dementia, R breast CA, s/p R lumpectomy, B knee sx.   OT comments  This OT responded to bed alarm going off with pt sitting on edge of bed, unaware alarm is sounding, attempting to eat breakfast. When re-directed, pt declines sitting in the chair and reports she will get back to bed. STS attempted with MOD-MAX A however this may be due to cognition/sequencing deficits and pt becomes increasingly agitated as this therapist attempts to facilitate pt in a safer position to eat as nurse reports patient is a high fall risk. MOD A required for sit>supine for assist of BLE. Set patient up for breakfast and pt is attempting to eat potatoes with a straw. Pt is left in bed with breakfast,tech notified of pt status and nurse via secure chat. OT will continue to follow.       If plan is discharge home, recommend the following:  A lot of help with walking and/or transfers;A lot of help with bathing/dressing/bathroom;A little help with walking and/or transfers   Equipment Recommendations  Wheelchair (measurements OT);Hospital bed    Recommendations for Other Services      Precautions / Restrictions Precautions Precautions: Fall Recall of Precautions/Restrictions: Impaired Restrictions Weight Bearing Restrictions Per Provider Order: No       Mobility Bed Mobility Overal bed mobility: Needs Assistance Bed Mobility: Sit to Supine     Supine to sit: Min assist, HOB elevated, Used rails     General bed mobility  comments: step by step multi modal cues on this date    Transfers Overall transfer level: Needs assistance     Sit to Stand: Mod-MAX assist pt is resistive                  Balance Overall balance assessment: Needs assistance Sitting-balance support: No upper extremity supported, Feet supported Sitting balance-Leahy Scale: Good     Standing balance support: Bilateral upper extremity supported, Reliant on assistive device for balance Standing balance-Leahy Scale: Poor                             ADL either performed or assessed with clinical judgement   ADL Overall ADL's : Needs assistance/impaired Eating/Feeding: Supervision/ safety;Bed level Eating/Feeding Details (indicate cue type and reason): pt is attempting to eat potatoes with straw                                        Extremity/Trunk Assessment              Vision       Perception     Praxis     Communication Communication Communication: No apparent difficulties   Cognition Arousal: Alert Behavior During Therapy: Agitated Cognition: Cognition impaired   Orientation impairments: Time, Situation     Attention impairment (select first level of impairment): Selective attention   OT - Cognition Comments: Pt is sitting on edge of  bed, bed alarm going off; appears internally/externally distracted on this date                 Following commands: Impaired Following commands impaired: Follows one step commands inconsistently      Cueing   Cueing Techniques: Verbal cues, Gestural cues, Tactile cues  Exercises Other Exercises Other Exercises: edu re: role of OT, role of rehab    Shoulder Instructions       General Comments      Pertinent Vitals/ Pain       Pain Assessment Pain Assessment: PAINAD Breathing: normal Negative Vocalization: none Facial Expression: smiling or inexpressive Body Language: relaxed Consolability: no need to console PAINAD  Score: 0  Home Living                                          Prior Functioning/Environment              Frequency  Min 2X/week        Progress Toward Goals  OT Goals(current goals can now be found in the care plan section)  Progress towards OT goals: Progressing toward goals  Acute Rehab OT Goals Time For Goal Achievement: 09/05/23  Plan      Co-evaluation                 AM-PAC OT "6 Clicks" Daily Activity     Outcome Measure   Help from another person eating meals?: None Help from another person taking care of personal grooming?: None Help from another person toileting, which includes using toliet, bedpan, or urinal?: A Lot Help from another person bathing (including washing, rinsing, drying)?: A Lot Help from another person to put on and taking off regular upper body clothing?: A Little Help from another person to put on and taking off regular lower body clothing?: A Lot 6 Click Score: 17    End of Session Equipment Utilized During Treatment: Rolling walker (2 wheels)  OT Visit Diagnosis: Other abnormalities of gait and mobility (R26.89)   Activity Tolerance Patient tolerated treatment well   Patient Left in bed;with call bell/phone within reach;with bed alarm set   Nurse Communication Mobility status        Time: 1610-9604 OT Time Calculation (min): 10 min  Charges: OT General Charges $OT Visit: 1 Visit OT Treatments $Therapeutic Activity: 8-22 mins Gerre Kraft, OTD OTR/L  08/27/23, 10:59 AM

## 2023-08-28 DIAGNOSIS — I214 Non-ST elevation (NSTEMI) myocardial infarction: Secondary | ICD-10-CM | POA: Diagnosis not present

## 2023-08-28 LAB — HEMOGLOBIN A1C
Hgb A1c MFr Bld: 7.2 % — ABNORMAL HIGH (ref 4.8–5.6)
Mean Plasma Glucose: 159.94 mg/dL

## 2023-08-28 NOTE — Progress Notes (Signed)
 PROGRESS NOTE    [name] [name]  ZOX:[id number] DOB: 07-16-1935 DOA: 08/21/2023 PCP: Melchor Spoon, MD  Chief Complaint  Patient presents with   Weakness    Hospital Course:  [name] with heart failure reduced EF, severe TR, type 2 diabetes, dementia, history of prior CVA, hyperlipidemia, who presented to the ED for generalized weakness.  Patient had recent EMS for chest pain and at that time required nitro for chest pain to resolve, she declined ED evaluation at that time.  On this admission patient was diagnosed with NSTEMI.  Cardiology was consulted.  Patient's troponin peaked at 3400.  Ultimately patient declined cath and cardiology advised medical management.  Patient completed 48 hours of IV heparin .  She has been continued on DAPT, Coreg , statin.  She does have advanced heart failure with an EF of 25% and severe mitral regurg.  Hide of care was consulted given extensive comorbidities and declining complete workup.  Ultimately patient was not ready for hospice but wants to proceed with palliative care outpatient.  TOC was consulted for SNF placement.  Subjective: No acute events overnight.  Sleeping easily on exam.  Has no acute complaints this morning.   Objective: Vitals:   08/28/23 0434 08/28/23 0727 08/28/23 1259 08/28/23 1548  BP: (!) 97/58 137/85 101/62 (!) 120/59  Pulse: 66 68 67 71  Resp: 20     Temp: 98 F (36.7 C) 97.7 F (36.5 C) 97.9 F (36.6 C) 97.9 F (36.6 C)  TempSrc:  Oral Oral Oral  SpO2: 100% 100% 100% 100%  Weight:      Height:        Intake/Output Summary (Last 24 hours) at 08/28/2023 1603 Last data filed at 08/28/2023 1000 Gross per 24 hour  Intake 360 ml  Output --  Net 360 ml    Filed Weights   08/21/23 1054  Weight: 72.8 kg    Examination: General exam: Appears calm and comfortable, frail, resting easily Respiratory system: No work of breathing, symmetric chest wall expansion Cardiovascular system: S1 & S2 heard, RRR.   Gastrointestinal system: Abdomen is nondistended, soft and nontender.  Extremities: Symmetric, expected ROM Skin: No rashes, lesions Psychiatry: Calm. Mood & affect appropriate for situation.   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)   Essential hypertension   PAD (peripheral artery disease) (HCC)   History of CVA (cerebrovascular accident)   Chronic HFrEF (heart failure with reduced ejection fraction) (HCC)   Pneumonia due to infectious organism   Weakness   Pulmonary hypertension, moderate to severe (HCC)   HFrEF (heart failure with reduced ejection fraction) (HCC)   Ischemic cardiomyopathy    NSTEMI - Troponin peak 3400 - Patient declined cath -status post 48 hours IV heparin  - Continue DAPT, Coreg , statin. - Cardiology has signed off.  Will reengage if new cardiac concern arises  Heart failure reduced EF - Biventricular failure, EF 25%, severe MR, pulmonary hypertension - Has not followed with advanced heart failure outpatient - No exacerbation currently - Continue with GDMT as BP tolerates - Continue daily assessment of volume status.  Currently euvolemic.  Dementia without behavioral disturbance - Resides permanently at assisted living facility, will need higher level of care at discharge - [facility] consulted and working on placement  Debility - Physical therapy/Occupational Therapy recommending SNF - TOC has been consulted for SNF placement  Type 2  diabetes - Monitoring daily CBG.  No Insulin  needed.   - Hemoglobin A1c 7.1%  - Hold metformin  while admitted.  Given advanced systolic heart failure unlikely we will not resume at discharge  History of CVA - Appears to be at her current baseline - Continue home meds.  Risperdal  on at bedtime per request  Hypertension - Blood pressures closely.  Titrate meds as needed. -  Today blood pressures appear to be at goal  Poor prognosis - In light of patient's multiple comorbidities including biventricular heart failure with reduced EF, pulmonary hypertension, NSTEMI requiring medical management, dementia, and generalized debility patient is an excellent candidate for hospice.  Palliative care has been consulted and spoken with the patient and her family.  At this time they are declining hospice but they are interested in palliative care outpatient.  They will transition to hospice care at a later time on an outpatient basis if they so desire  DVT prophylaxis: Lovenox    Code Status: Limited: Do not attempt resuscitation (DNR) -DNR-LIMITED -Do Not Intubate/DNI  Disposition: TOC consulted to assist in outpatient arrangements. Remains medically cleared for discharge when this is arranged. Attempted to reach patient's daughter, Francoise Ishihara today.  No answer  Consultants:    Procedures:    Antimicrobials:  Anti-infectives (From admission, onward)    Start     Dose/Rate Route Frequency Ordered Stop   08/22/23 1200  cefTRIAXone  (ROCEPHIN ) 1 g in sodium chloride  0.9 % 100 mL IVPB  Status:  Discontinued        1 g 200 mL/hr over 30 Minutes Intravenous Every 24 hours 08/21/23 2130 08/22/23 0851   08/22/23 0400  doxycycline  (VIBRA -TABS) tablet 100 mg  Status:  Discontinued        100 mg Oral Every 12 hours 08/21/23 2130 08/22/23 0851   08/21/23 1200  cefTRIAXone  (ROCEPHIN ) 1 g in sodium chloride  0.9 % 100 mL IVPB        1 g 200 mL/hr over 30 Minutes Intravenous  Once 08/21/23 1152 08/21/23 1607   08/21/23 1200  doxycycline  (VIBRAMYCIN ) 100 mg in sodium chloride  0.9 % 250 mL IVPB        100 mg 125 mL/hr over 120 Minutes Intravenous  Once 08/21/23 1152 08/21/23 1813       Data Reviewed: I have personally reviewed following labs and imaging studies CBC: Recent Labs  Lab 08/22/23 0524 08/23/23 0359  WBC 6.5 5.6  HGB 13.2 12.7  HCT 39.5 39.5  MCV 91.0 92.5  PLT 299  285   Basic Metabolic Panel: Recent Labs  Lab 08/23/23 0359 08/24/23 0358 08/25/23 0428 08/26/23 0658 08/27/23 0328  NA 139 140 136 136 136  K 4.1 4.2 4.9 4.7 4.6  CL 111 109 107 105 105  CO2 19* 21* 16* 21* 17*  GLUCOSE 153* 106* 154* 122* 84  BUN 28* 26* 28* 26* 25*  CREATININE 1.10* 1.07* 1.03* 0.98 1.08*  CALCIUM  8.7* 9.2 9.2 9.1 9.1   GFR: Estimated Creatinine Clearance: 37.4 mL/min (A) (by C-G formula based on SCr of 1.08 mg/dL (H)). Liver Function Tests: No results for input(s): "AST", "ALT", "ALKPHOS", "BILITOT", "PROT", "ALBUMIN" in the last 168 hours.  CBG: Recent Labs  Lab 08/26/23 1533 08/26/23 2214 08/27/23 0902 08/27/23 1242 08/27/23 1610  GLUCAP 160* 78 73 99 96    Recent Results (from the past 240 hours)  Urine Culture     Status: Abnormal   Collection Time: 08/23/23  1:17 AM   Specimen: Urine, Random  Result Value Ref Range Status   Specimen Description   Final    URINE, RANDOM Performed at [facility], [address] [place], [place] [address]    Special Requests   Final    NONE Reflexed from [phone] [id number] Performed at [facility], [address] [place], [place] [address]    Culture (A)  Final    <10,000 COLONIES/mL INSIGNIFICANT GROWTH Performed at [facility], [address] [place], [place] [address]    Report Status 08/24/2023 FINAL  Final     Radiology Studies: No results found.  Scheduled Meds:  aspirin  EC  81 mg Oral Daily   atorvastatin   40 mg Oral Daily   clopidogrel   75 mg Oral Daily   dapagliflozin  propanediol  10 mg Oral Daily   enoxaparin  (LOVENOX ) injection  40 mg Subcutaneous Q24H   losartan   12.5 mg Oral Daily   memantine   20 mg Oral QPM   metoprolol  succinate  12.5 mg Oral QHS   polyethylene glycol  17 g Oral Daily   risperiDONE   0.25 mg Oral QHS   spironolactone   12.5 mg Oral Daily   Continuous Infusions:   LOS: 7 days  MDM: Patient is high risk for one or more organ failure.  They  necessitate ongoing hospitalization for continued IV therapies and subsequent lab monitoring. Total time spent interpreting labs and vitals, reviewing the medical record, coordinating care amongst consultants and care team members, directly assessing and discussing care with the patient and/or family: 55 min  Kairee Kozma, DO [facility]  To contact the attending physician between 7A-7P please use Epic Chat. To contact the covering physician during after hours 7P-7A, please review Amion.  08/28/2023, 4:03 PM   *This document has been created with the assistance of dictation software. Please excuse typographical errors. *

## 2023-08-28 NOTE — Progress Notes (Signed)
 PT Cancellation Note  Patient Details Name: [name] [name] MRN: [id number] DOB: [birth year]   Cancelled Treatment:     Pt continues to refuse skilled PT services x 3 days. Unable to encourage participation. May need to d/c services due to poor compliance and cooperation.    Diona Franklin 08/28/2023, 2:54 PM

## 2023-08-28 NOTE — TOC Progression Note (Addendum)
 Transition of Care [facility]) - Progression Note    Patient Details  Name: [name] [name] MRN: [id number] Date of Birth: October 09, 1935  Transition [facility] CM/SW Contact  Baird Bombard, RN Phone Number: 08/28/2023, 12:17 PM  Clinical Narrative:    Spoke with patient's daughter Francoise Ishihara. She is requesting bed search be extended to [facility], [facility], [facility], and [facility]. She was advised [facility] does not have a bed available today but may soon. CMS guideline reiterated regarding discharging to first available bed at discharge. Francoise Ishihara mentioned possible taking patient home if facility of preference is not located. RNCM advised of right to appeal discharge as well.    2:59pm Attempt to reach [facility].No answer.    Expected Discharge Plan:  (TBD) Barriers to Discharge: Continued Medical Work up  Expected Discharge Plan and Services     Post Acute Care Choice:  (TBD) Living arrangements for the past 2 months: Independent Living Facility                           HH Arranged: RN, PT, OT [facility] Agency: [facility] Date HH Agency Contacted: 08/22/23   Representative spoke with at [facility] Agency: Shaun   Social Determinants of Health (SDOH) Interventions SDOH Screenings   Food Insecurity: No Food Insecurity (08/21/2023)  Housing: Low Risk  (08/21/2023)  Transportation Needs: No Transportation Needs (08/21/2023)  Utilities: Not At Risk (08/21/2023)  Financial Resource Strain: Low Risk  (02/21/2023)   Received from [facility]  Social Connections: Socially Isolated (08/21/2023)  Tobacco Use: Medium Risk (08/21/2023)    Readmission Risk Interventions    08/22/2023    2:18 PM  Readmission Risk Prevention Plan  Transportation Screening Complete  Medication Review (RN Care Manager) Complete  PCP or Specialist appointment within 3-5 days of discharge Complete  HRI or Home Care Consult Complete  SW Recovery  Care/Counseling Consult Complete  Palliative Care Screening Not Applicable  Skilled Nursing Facility Not Complete  SNF Comments Will complete tomorrow if she will meet 3-night inpatient stay.

## 2023-08-28 NOTE — Progress Notes (Signed)
   08/28/23 1410  Spiritual Encounters  Type of Visit Follow up  Care provided to: Family  Referral source Non-clinical staff  Reason for visit Advance directives  OnCall Visit Yes  Spiritual Framework  Presenting Themes Caregiving needs;Coping tools  Interventions  Spiritual Care Interventions Made Compassionate presence;Prayer;Encouragement  Intervention Outcomes  Outcomes Awareness around self/spiritual resourses;Awareness of support  Spiritual Care Plan  Spiritual Care Issues Still Outstanding No further spiritual care needs at this time (see row info)   Patient's daughter asked for advance directives, however, chaplain is unable to complete as patient is not able to understand due to diagnosis of dementia. Prayed with daughter, Francoise Ishihara, to better understand next steps.

## 2023-08-29 DIAGNOSIS — I214 Non-ST elevation (NSTEMI) myocardial infarction: Secondary | ICD-10-CM | POA: Diagnosis not present

## 2023-08-29 MED ORDER — ENSURE ENLIVE PO LIQD
237.0000 mL | Freq: Two times a day (BID) | ORAL | Status: DC
  Administered 2023-08-31: 237 mL via ORAL

## 2023-08-29 NOTE — TOC Progression Note (Addendum)
 Transition of Care [facility]) - Progression Note    Patient Details  Name: [name] [name] MRN: [id number] Date of Birth: December 12, 1935  Transition of [facility]) CM/SW Contact  Baird Bombard, RN Phone Number: 08/29/2023, 10:14 AM  Clinical Narrative:    Eldora Greet with Fabian Holster from [facility]. No female bed is available for today. He may have one available for tomorrow.   Left a message for Antony Baumgartner at [facility] regarding update on status of referral sent yesterday.   Spoke with Jade from [facility]. Patient can be accepted today. Per Rodger Civil there ae no private rooms available.  12:33pm Retrieved call from patient's daughter Francoise Ishihara. She was advised patient has a bed offer at [facility]. Francoise Ishihara stated she no longer wanted [facility] after reviewing facility online. She was advised patient has a safe discharge plan. RNCM reiterated CMS guideline to discharge to first available bed.Francoise Ishihara stated she had not heard this before today. She was reminded of conversation yesterday and previous to then. Francoise Ishihara inquired about other bed searches done as a courtesy and was advised no other facility other than [facility] has offer a bed. She was informed of her right to appeal at discharge and other options for discharge like HH, and asking for a facility transfer.  She stated she needed time to tour facility and "Think about it." Francoise Ishihara inquired if room is private. She was advised patient's room at [facility] is not a private room and again facility amenities or lack thereof is not  a reason to prolong a discharge. RNCM advised patient would still discharge and arrangements are proactive. RNCM advised patient would not qualify for EMS transport due to being ambulatory. Francoise Ishihara informed patient will need transportation to facility. Francoise Ishihara stated she will not be able to transport to facility until this evening.  MD made aware facility can accept patient today.   3:24pm Retrieved a call from [facility]. She stated she is unable to  transport to facility this evening. She stated she didn't understand why patient is unable to discharge tomorrow. She was advised no discharge order had been submitted. Francoise Ishihara stated it isn't fair for patient to discharge today and have to use one of her three remaining full medicare days. She was advised discharge is determined by the MD. She stated she would be able to come tomorrow.  3:35pm  Retrieved call from [facility] regarding discharge today. Jade advised discharge is likely tomorrow.     Expected Discharge Plan:  (TBD) Barriers to Discharge: Continued Medical Work up  Expected Discharge Plan and Services     Post Acute Care Choice:  (TBD) Living arrangements for the past 2 months: Independent Living Facility                           HH Arranged: RN, PT, OT [facility] Agency: [facility] Date HH Agency Contacted: 08/22/23   Representative spoke with at [facility] Agency: Shaun   Social Determinants of Health (SDOH) Interventions SDOH Screenings   Food Insecurity: No Food Insecurity (08/21/2023)  Housing: Low Risk  (08/21/2023)  Transportation Needs: No Transportation Needs (08/21/2023)  Utilities: Not At Risk (08/21/2023)  Financial Resource Strain: Low Risk  (02/21/2023)   Received from [facility]  Social Connections: Socially Isolated (08/21/2023)  Tobacco Use: Medium Risk (08/21/2023)    Readmission Risk Interventions    08/22/2023    2:18 PM  Readmission Risk Prevention Plan  Transportation Screening Complete  Medication  Review [employer]) Complete  PCP or Specialist appointment within 3-5 days of discharge Complete  HRI or Home Care Consult Complete  SW Recovery Care/Counseling Consult Complete  Palliative Care Screening Not Applicable  Skilled Nursing Facility Not Complete  SNF Comments Will complete tomorrow if she will meet 3-night inpatient stay.

## 2023-08-29 NOTE — Progress Notes (Signed)
 PROGRESS NOTE    [name] [name]  ZOX:[id number] DOB: 07/27/1935 DOA: 08/21/2023 PCP: Melchor Spoon, MD  Chief Complaint  Patient presents with   Weakness    Hospital Course:  Yvonna Moan with heart failure reduced EF, severe TR, type 2 diabetes, dementia, history of prior CVA, hyperlipidemia, who presented to the ED for generalized weakness.  Patient had recent EMS for chest pain and at that time required nitro for chest pain to resolve, she declined ED evaluation at that time.  On this admission patient was diagnosed with NSTEMI.  Cardiology was consulted.  Patient's troponin peaked at 3400.  Ultimately patient declined cath and cardiology advised medical management.  Patient completed 48 hours of IV heparin .  She has been continued on DAPT, Coreg , statin.  She does have advanced heart failure with an EF of 25% and severe mitral regurg.  Palliative care was consulted given her extensive comorbidities and declining complete workup.  Ultimately patient was not ready for hospice but wants to proceed with palliative care outpatient.  TOC was consulted for SNF placement.  Subjective: Patient is acutely delirious today.  She is talking about watching a baseball game.  There is nothing on TV   Objective: Vitals:   08/28/23 2319 08/29/23 0326 08/29/23 0836 08/29/23 1210  BP: 139/86 100/87 122/72 117/77  Pulse: 80 67 85 71  Resp: (!) 24 20 18 18   Temp: 97.7 F (36.5 C) 98 F (36.7 C) 98.2 F (36.8 C) 97.9 F (36.6 C)  TempSrc: Oral Oral Axillary Axillary  SpO2: 100% 100% 98% 100%  Weight:      Height:        Intake/Output Summary (Last 24 hours) at 08/29/2023 1731 Last data filed at 08/29/2023 8119 Gross per 24 hour  Intake --  Output 500 ml  Net -500 ml    Filed Weights   08/21/23 1054  Weight: 72.8 kg    Examination: General exam: Appears calm and comfortable, frail Respiratory system: No work of breathing, symmetric chest wall expansion Cardiovascular system: S1 & S2  heard, RRR.  Gastrointestinal system: Abdomen is nondistended, soft and nontender.  Extremities: Symmetric, expected ROM Neuro: Pleasantly confused Skin: No rashes, lesions Psychiatry: Calm.  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    Heart failure reduced EF - Biventricular failure, EF 25%, severe MR, pulmonary hypertension - Has not followed with advanced heart failure outpatient - No exacerbation currently - Continue with GDMT as BP tolerates -continue daily assessment of volume status.  Currently euvolemic  Dementia without behavioral disturbance - Resides permanently at assisted living facility, will need higher level of care at discharge - She has declined hospice. - Appears patient has a SNF bed at this time, daughter visiting this afternoon.  Can likely discharge 5/21  Debility - Physical therapy/Occupational Therapy recommending SNF - TOC has been  consulted for SNF placement  Type 2 diabetes - Monitoring daily CBG.  No Insulin  needed.   - Hemoglobin A1c 7.1%  - Hold metformin  while admitted.  Given advanced systolic heart failure unlikely we will not resume at discharge  History of CVA - Appears to be at her current baseline - Continue home meds.  Risperdal  on at bedtime per request  Hypertension - Pressures  at goal.  Continue current therapies  Poor prognosis - In light of patient's multiple comorbidities including biventricular heart failure with reduced EF, pulmonary hypertension, NSTEMI requiring medical management, dementia, and generalized debility patient is an excellent candidate for hospice.  Palliative care has been consulted and spoken with the patient and her family.  At this time they are declining hospice but they are interested in palliative care outpatient.  They will transition to hospice care at a later time on an outpatient basis if they so desire  DVT prophylaxis: Lovenox    Code Status: Limited: Do not attempt resuscitation (DNR) -DNR-LIMITED -Do Not Intubate/DNI  Disposition: Have spoken with daughter, [name] [name], today.  She requests time to visit the facility.  Reports she will be unable to pick the patient up to transport today.  She will be available tomorrow.  Discharge in a.m.  Consultants:    Procedures:    Antimicrobials:  Anti-infectives (From admission, onward)    Start     Dose/Rate Route Frequency Ordered Stop   08/22/23 1200  cefTRIAXone  (ROCEPHIN ) 1 g in sodium chloride  0.9 % 100 mL IVPB  Status:  Discontinued        1 g 200 mL/hr over 30 Minutes Intravenous Every 24 hours 08/21/23 2130 08/22/23 0851   08/22/23 0400  doxycycline  (VIBRA -TABS) tablet 100 mg  Status:  Discontinued        100 mg Oral Every 12 hours 08/21/23 2130 08/22/23 0851   08/21/23 1200  cefTRIAXone  (ROCEPHIN ) 1 g in sodium chloride  0.9 % 100 mL IVPB        1 g 200 mL/hr over 30 Minutes Intravenous  Once 08/21/23 1152 08/21/23 1607   08/21/23 1200  doxycycline  (VIBRAMYCIN ) 100 mg in sodium chloride  0.9 % 250 mL IVPB        100 mg 125 mL/hr over 120 Minutes Intravenous  Once 08/21/23 1152 08/21/23 1813       Data Reviewed: I have personally reviewed following labs and imaging studies CBC: Recent Labs  Lab 08/23/23 0359  WBC 5.6  HGB 12.7  HCT 39.5  MCV 92.5  PLT 285   Basic  Metabolic Panel: Recent Labs  Lab 08/23/23 0359 08/24/23 0358 08/25/23 0428 08/26/23 0658 08/27/23 0328  NA 139 140 136 136 136  K 4.1 4.2 4.9 4.7 4.6  CL 111 109 107 105 105  CO2 19* 21* 16* 21* 17*  GLUCOSE 153* 106* 154* 122* 84  BUN 28* 26* 28* 26* 25*  CREATININE 1.10* 1.07* 1.03* 0.98 1.08*  CALCIUM  8.7* 9.2 9.2 9.1 9.1   GFR: Estimated Creatinine Clearance: 37.4 mL/min (A) (by C-G formula based on SCr of 1.08 mg/dL (H)). Liver Function Tests: No results for input(s): "AST", "ALT", "ALKPHOS", "BILITOT", "PROT", "ALBUMIN" in the last 168 hours.  CBG: Recent Labs  Lab 08/26/23 1533 08/26/23 2214 08/27/23 0902 08/27/23 1242 08/27/23 1610  GLUCAP 160* 78 73 99 96    Recent Results (from the past 240 hours)  Urine Culture     Status: Abnormal   Collection Time: 08/23/23  1:17 AM   Specimen: Urine,  Random  Result Value Ref Range Status   Specimen Description   Final    URINE, RANDOM Performed at [facility], [address], [place], [place] [address]    Special Requests   Final    NONE Reflexed from [id number] Performed at [facility], [address] [place], [place] [address]    Culture (A)  Final    <10,000 COLONIES/mL INSIGNIFICANT GROWTH Performed at [facility], [address], [place], [place] [address]    Report Status 08/24/2023 FINAL  Final     Radiology Studies: No results found.  Scheduled Meds:  aspirin  EC  81 mg Oral Daily   atorvastatin   40 mg Oral Daily   clopidogrel   75 mg Oral Daily   dapagliflozin  propanediol  10 mg Oral Daily   enoxaparin  (LOVENOX ) injection  40 mg Subcutaneous Q24H   losartan   12.5 mg Oral Daily   memantine   20 mg Oral QPM   metoprolol  succinate  12.5 mg Oral QHS   polyethylene glycol  17 g Oral Daily   risperiDONE   0.25 mg Oral QHS   spironolactone   12.5 mg Oral Daily   Continuous Infusions:   LOS: 8 days  MDM: Patient is high risk for one or more organ failure.  They necessitate  ongoing hospitalization for continued IV therapies and subsequent lab monitoring. Total time spent interpreting labs and vitals, reviewing the medical record, coordinating care amongst consultants and care team members, directly assessing and discussing care with the patient and/or family: 55 min  [name] Mesquita, DO [facility]  To contact the attending physician between 7A-7P please use Epic Chat. To contact the covering physician during after hours 7P-7A, please review Amion.  08/29/2023, 5:31 PM   *This document has been created with the assistance of dictation software. Please excuse typographical errors. *

## 2023-08-29 NOTE — Hospital Course (Signed)
 Chaye Eden with heart failure reduced EF, severe TR, type 2 diabetes, dementia, history of prior CVA, hyperlipidemia, who presented to the ED for generalized weakness.  Patient had recent EMS for chest pain and at that time required nitro for chest pain to resolve, she declined ED evaluation at that time.  On this admission patient was diagnosed with NSTEMI.  Cardiology was consulted.  Patient's troponin peaked at 3400.  Ultimately patient declined cath and cardiology advised medical management.  Patient completed 48 hours of IV heparin .  She has been continued on DAPT, Coreg , statin.  She does have advanced heart failure with an EF of 25% and severe mitral regurg.  Palliative care was consulted given her extensive comorbidities and declining complete workup.  Ultimately patient was not ready for hospice but wants to proceed with palliative care outpatient.  TOC was consulted for SNF placement. Stay was prolonged pending SNF arrangements.

## 2023-08-29 NOTE — Plan of Care (Signed)
   Problem: Safety: Goal: Ability to remain free from injury will improve Outcome: Progressing

## 2023-08-30 DIAGNOSIS — I214 Non-ST elevation (NSTEMI) myocardial infarction: Secondary | ICD-10-CM | POA: Diagnosis not present

## 2023-08-30 DIAGNOSIS — J189 Pneumonia, unspecified organism: Secondary | ICD-10-CM | POA: Diagnosis not present

## 2023-08-30 DIAGNOSIS — R531 Weakness: Secondary | ICD-10-CM | POA: Diagnosis not present

## 2023-08-30 MED ORDER — RISPERIDONE 0.25 MG PO TABS: 0.2500 mg | ORAL_TABLET | Freq: Every day | ORAL | 0 refills | Status: DC

## 2023-08-30 MED ORDER — ATORVASTATIN CALCIUM 40 MG PO TABS: 40.0000 mg | ORAL_TABLET | Freq: Every day | ORAL | Status: DC

## 2023-08-30 MED ORDER — METOPROLOL SUCCINATE ER 25 MG PO TB24: 12.5000 mg | ORAL_TABLET | Freq: Every day | ORAL | Status: DC

## 2023-08-30 MED ORDER — CLOPIDOGREL BISULFATE 75 MG PO TABS: 75.0000 mg | ORAL_TABLET | Freq: Every day | ORAL | Status: DC

## 2023-08-30 NOTE — Progress Notes (Signed)
 PT Cancellation Note  Patient Details Name: [name] [name] MRN: [id number] DOB: 26-Feb-1936   Cancelled Treatment:    Reason Eval/Treat Not Completed: Patient declined, no reason specified Patient declined getting up oob at this time. Due to multiple refusals to work with therapy will sign off at this time.    Laniqua Torrens 08/30/2023, 10:15 AM

## 2023-08-30 NOTE — Progress Notes (Signed)
 Occupational Therapy Treatment Patient Details Name: [name] [name] MRN: [id number] DOB: 06/12/1935 Today's Date: 08/30/2023   History of present illness Pt is an 88 y.o. female presenting to hospital 08/21/23 with c/o generalized weakness x4 weeks.  Hospitalization April for CHF exacerbation and generalized weakness; c/o L foot pain at that time--(-) fx via x-ray.  Pt admitted with NSTEMI, HFrEF, possible community acquired PNA.  PMH includes htn, h/o stroke, DM type 2, chronic systolic CHF, dementia, R breast CA, s/p R lumpectomy, B knee sx.   OT comments  Upon entering the room, pt supine in bed and sleeping with LEs handing off EOB. OT asking to assist pt with repositioning and she refused. Pt given warm cloth and she washes face and hands with set up A. She refuses all OOB mobility and self care tasks. Pt is finally agreeable to repositioning and does so with use of bedrail and supervision. Pt becomes more upset/agitated when therapist continues to ask pt to participate. Session terminated as pt is not willing to continue. Call bell and all needed items within reach and bed alarm activated.      If plan is discharge home, recommend the following:  A lot of help with walking and/or transfers;A lot of help with bathing/dressing/bathroom;A little help with walking and/or transfers   Equipment Recommendations  Wheelchair (measurements OT);Hospital bed       Precautions / Restrictions Precautions Precautions: Fall Recall of Precautions/Restrictions: Impaired       Mobility Bed Mobility Overal bed mobility: Needs Assistance Bed Mobility: Rolling Rolling: Supervision                         ADL either performed or assessed with clinical judgement   ADL Overall ADL's : Needs assistance/impaired     Grooming: Wash/dry face;Wash/dry hands;Supervision/safety;Set up                                      Extremity/Trunk Assessment Upper Extremity  Assessment Upper Extremity Assessment: Generalized weakness   Lower Extremity Assessment Lower Extremity Assessment: Generalized weakness        Vision Patient Visual Report: No change from baseline           Communication Communication Communication: No apparent difficulties   Cognition Arousal: Alert Behavior During Therapy: WFL for tasks assessed/performed Cognition: Cognition impaired   Orientation impairments: Time, Situation, Place Awareness: Intellectual awareness impaired, Online awareness impaired   Attention impairment (select first level of impairment): Selective attention, Sustained attention Executive functioning impairment (select all impairments): Reasoning                   Following commands: Impaired Following commands impaired: Follows one step commands inconsistently      Cueing   Cueing Techniques: Verbal cues, Gestural cues, Tactile cues             Pertinent Vitals/ Pain       Pain Assessment Pain Assessment: Faces Faces Pain Scale: No hurt         Frequency  Min 2X/week        Progress Toward Goals  OT Goals(current goals can now be found in the care plan section)  Progress towards OT goals: Progressing toward goals      AM-PAC OT "6 Clicks" Daily Activity     Outcome Measure   Help from another person eating meals?: None Help from  another person taking care of personal grooming?: None Help from another person toileting, which includes using toliet, bedpan, or urinal?: A Lot Help from another person bathing (including washing, rinsing, drying)?: A Lot Help from another person to put on and taking off regular upper body clothing?: A Lot Help from another person to put on and taking off regular lower body clothing?: A Lot 6 Click Score: 16    End of Session    OT Visit Diagnosis: Other abnormalities of gait and mobility (R26.89)   Activity Tolerance Other (comment) (Pt is self limiting)   Patient Left in  bed;with call bell/phone within reach;with bed alarm set   Nurse Communication          Time: 4098-1191 OT Time Calculation (min): 9 min  Charges: OT General Charges $OT Visit: 1 Visit OT Treatments $Self Care/Home Management : 8-22 mins  George Kinder, MS, OTR/L , CBIS ascom [phone]  08/30/23, 1:01 PM

## 2023-08-30 NOTE — Discharge Summary (Signed)
 Physician Discharge Summary  Patient: [name] [name] ZOX:[id number] DOB: Jun 08, 1935   Code Status: Limited: Do not attempt resuscitation (DNR) -DNR-LIMITED -Do Not Intubate/DNI  Admit date: 08/21/2023 Discharge date: 08/30/2023 Disposition: Skilled nursing facility, PT, OT, SLP, nurse aid, RN, Child psychotherapist, and wound care PCP: Melchor Spoon, MD  Recommendations for Outpatient Follow-up:  Follow up with PCP within 1-2 weeks Regarding general hospital follow up and preventative care  Discharge Diagnoses:  Principal Problem:   NSTEMI (non-ST elevated myocardial infarction) [facility]) Active Problems:   Diabetes (HCC)   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)   Essential hypertension   PAD (peripheral artery disease) (HCC)   History of CVA (cerebrovascular accident)   Chronic HFrEF (heart failure with reduced ejection fraction) (HCC)   Pneumonia due to infectious organism   Weakness   Pulmonary hypertension, moderate to severe (HCC)   HFrEF (heart failure with reduced ejection fraction) [facility])   Ischemic cardiomyopathy  Brief Hospital Course Summary: [name] [name] is a 88yo Female with PMH of heart failure reduced EF, severe TR, type 2 diabetes, dementia, history of prior CVA, hyperlipidemia, who presented to the ED for generalized weakness.  Patient had recent EMS for chest pain and at that time required nitro for chest pain to resolve, she declined ED evaluation at that time.  On this admission patient was diagnosed with NSTEMI.  Cardiology was consulted.  Patient's troponin peaked at 3400.  Ultimately patient declined cath and cardiology advised medical management.  Patient completed 48 hours of IV heparin .  She has been continued on DAPT, Coreg , statin.  She does have advanced heart failure with an EF of 25% and severe mitral regurg. Palliative care was consulted given extensive comorbidities and declining complete workup.  Ultimately patient was not ready for hospice but wants  to proceed with palliative care outpatient.  TOC was consulted for SNF placement and she was offered 2 SNF beds but daughter appealing dc due to not getting a bed at her preferred facility.   All other chronic conditions were treated with home medications.    Discharge Condition: Stable, improved Recommended discharge diet: Cardiac diet  Consultations: Cardiology Palliative   Procedures/Studies: None  Discharge Instructions     Call MD for:  difficulty breathing, headache or visual disturbances   Complete by: As directed    Call MD for:  persistant dizziness or light-headedness   Complete by: As directed    Call MD for:  persistant nausea and vomiting   Complete by: As directed    Call MD for:  severe uncontrolled pain   Complete by: As directed    Call MD for:  temperature >100.4   Complete by: As directed    Diet general   Complete by: As directed    Discharge instructions   Complete by: As directed    Follow up with your primary care physician to discuss the medication changes during this admission   Increase activity slowly   Complete by: As directed       Allergies as of 08/30/2023       Reactions   Codeine Other (See Comments)   "I go crazy"   Etodolac    Other reaction(s): Unknown   Indomethacin    Other reaction(s): Unknown   Penicillins Other (See Comments)   Does not remember this allergy Has patient had a PCN reaction causing immediate rash, facial/tongue/throat swelling, SOB or lightheadedness with hypotension: Unknown Has patient had a PCN reaction causing  severe rash involving mucus membranes or skin necrosis: Unknown Has patient had a PCN reaction that required hospitalization: Unknown Has patient had a PCN reaction occurring within the last 10 years: Unknown If all of the above answers are "NO", then may proceed with Cephalosporin use.   Tizanidine    Other reaction(s): Syncope Fell on the floor    Tramadol    Other reaction(s): Unknown         Medication List     STOP taking these medications    carvedilol  3.125 MG tablet Commonly known as: COREG    metFORMIN  1000 MG tablet Commonly known as: GLUCOPHAGE    promethazine 12.5 MG tablet Commonly known as: PHENERGAN       TAKE these medications    allopurinol  100 MG tablet Commonly known as: ZYLOPRIM  Take 1 tablet by mouth daily.   aspirin  EC 81 MG tablet Take 1 tablet (81 mg total) by mouth daily. Swallow whole.   atorvastatin  40 MG tablet Commonly known as: LIPITOR Take 1 tablet (40 mg total) by mouth daily.   busPIRone  7.5 MG tablet Commonly known as: BUSPAR  Take 7.5 mg by mouth 2 (two) times daily.   clopidogrel  75 MG tablet Commonly known as: PLAVIX  Take 1 tablet (75 mg total) by mouth daily.   colchicine  0.6 MG tablet Take 0.6 mg by mouth daily as needed.   dapagliflozin  propanediol 10 MG Tabs tablet Commonly known as: FARXIGA  Take 1 tablet (10 mg total) by mouth daily.   furosemide  20 MG tablet Commonly known as: LASIX  Take 20 mg by mouth daily.   Loperamide  A-D 2 MG tablet Generic drug: loperamide  Take 2 mg by mouth as needed for diarrhea or loose stools.   losartan  25 MG tablet Commonly known as: COZAAR  Take 12.5 mg by mouth daily.   memantine  10 MG tablet Commonly known as: NAMENDA  Take 2 tablets by mouth every evening.   metoprolol  succinate 25 MG 24 hr tablet Commonly known as: TOPROL -XL Take 0.5 tablets (12.5 mg total) by mouth at bedtime.   nystatin powder Commonly known as: MYCOSTATIN/NYSTOP Apply 1 Application topically 3 (three) times daily.   risperiDONE  0.5 MG tablet Commonly known as: RISPERDAL  Take 0.5 mg by mouth 2 (two) times daily as needed. What changed: Another medication with the same name was added. Make sure you understand how and when to take each.   risperiDONE  0.25 MG tablet Commonly known as: RISPERDAL  Take 1 tablet (0.25 mg total) by mouth at bedtime. What changed: You were already taking a  medication with the same name, and this prescription was added. Make sure you understand how and when to take each.   sodium chloride  0.65 % Soln nasal spray Commonly known as: OCEAN Place 1 spray into both nostrils as needed for congestion.   spironolactone  25 MG tablet Commonly known as: ALDACTONE  Take 12.5 mg by mouth daily.         Subjective   Pt reports no complaints. She denies CP, SOB. She appears to be sleeping while talking but answers questions appropriately. Denies complaints.   All questions and concerns were addressed at time of discharge.  Objective  Blood pressure (!) 125/95, pulse 83, temperature 97.6 F (36.4 C), resp. rate 17, height 5\' 5"  (1.651 m), weight 72.8 kg, SpO2 100%.   General: Pt is awake and resting with eyes closed, not in acute distress. Alert to voice.  Cardiovascular: RRR, S1/S2 +, no rubs, no gallops Respiratory: CTA bilaterally, no wheezing, no rhonchi Abdominal: Soft, NT,  ND, bowel sounds + Extremities: no edema, no cyanosis Flat mood  The results of significant diagnostics from this hospitalization (including imaging, microbiology, ancillary and laboratory) are listed below for reference.   Imaging studies: CT Head Wo Contrast Result Date: 08/21/2023 CLINICAL DATA:  Neck trauma EXAM: CT HEAD WITHOUT CONTRAST CT CERVICAL SPINE WITHOUT CONTRAST TECHNIQUE: Multidetector CT imaging of the head and cervical spine was performed following the standard protocol without intravenous contrast. Multiplanar CT image reconstructions of the cervical spine were also generated. RADIATION DOSE REDUCTION: This exam was performed according to the departmental dose-optimization program which includes automated exposure control, adjustment of the mA and/or kV according to patient size and/or use of iterative reconstruction technique. COMPARISON:  07/14/2023 FINDINGS: CT HEAD FINDINGS Brain: No evidence of acute infarction, hemorrhage, hydrocephalus, extra-axial  collection or mass lesion/mass effect. Pronounced generalized brain atrophy. Chronic small vessel ischemia in the deep cerebral white matter. Encephalomalacia in the cerebellum, usually post ischemic although centered in the midline. Vascular: No hyperdense vessel or unexpected calcification. Skull: Normal. Negative for fracture or focal lesion. Sinuses/Orbits: Negative CT CERVICAL SPINE FINDINGS Alignment: Normal. Skull base and vertebrae: No acute fracture. No primary bone lesion or focal pathologic process. Soft tissues and spinal canal: No prevertebral fluid or swelling. No visible canal hematoma. Disc levels:  Mild degenerative spurring. Upper chest: Small layering pleural effusions IMPRESSION: No evidence of acute intracranial or cervical spine injury. Electronically Signed   By: Ronnette Coke M.D.   On: 08/21/2023 12:13   CT Cervical Spine Wo Contrast Result Date: 08/21/2023 CLINICAL DATA:  Neck trauma EXAM: CT HEAD WITHOUT CONTRAST CT CERVICAL SPINE WITHOUT CONTRAST TECHNIQUE: Multidetector CT imaging of the head and cervical spine was performed following the standard protocol without intravenous contrast. Multiplanar CT image reconstructions of the cervical spine were also generated. RADIATION DOSE REDUCTION: This exam was performed according to the departmental dose-optimization program which includes automated exposure control, adjustment of the mA and/or kV according to patient size and/or use of iterative reconstruction technique. COMPARISON:  07/14/2023 FINDINGS: CT HEAD FINDINGS Brain: No evidence of acute infarction, hemorrhage, hydrocephalus, extra-axial collection or mass lesion/mass effect. Pronounced generalized brain atrophy. Chronic small vessel ischemia in the deep cerebral white matter. Encephalomalacia in the cerebellum, usually post ischemic although centered in the midline. Vascular: No hyperdense vessel or unexpected calcification. Skull: Normal. Negative for fracture or focal  lesion. Sinuses/Orbits: Negative CT CERVICAL SPINE FINDINGS Alignment: Normal. Skull base and vertebrae: No acute fracture. No primary bone lesion or focal pathologic process. Soft tissues and spinal canal: No prevertebral fluid or swelling. No visible canal hematoma. Disc levels:  Mild degenerative spurring. Upper chest: Small layering pleural effusions IMPRESSION: No evidence of acute intracranial or cervical spine injury. Electronically Signed   By: Ronnette Coke M.D.   On: 08/21/2023 12:13   DG Chest 2 View Result Date: 08/21/2023 CLINICAL DATA:  Weakness EXAM: CHEST - 2 VIEW COMPARISON:  July 09, 2023 FINDINGS: Bilateral pulmonary infiltrates with consolidative changes of the left lower lobe may correlate with pneumonia. Superimposed congestive changes?. Heart and mediastinum upper limits of normal IMPRESSION: Infiltrates as described could correlate with pneumonia Electronically Signed   By: Fredrich Jefferson M.D.   On: 08/21/2023 11:45   CT HEAD WO CONTRAST Result Date: 08/10/2023 CLINICAL DATA:  Head trauma, fall this morning.  Dizziness. EXAM: CT HEAD WITHOUT CONTRAST TECHNIQUE: Contiguous axial images were obtained from the base of the skull through the vertex without intravenous contrast. RADIATION DOSE REDUCTION: This exam  was performed according to the departmental dose-optimization program which includes automated exposure control, adjustment of the mA and/or kV according to patient size and/or use of iterative reconstruction technique. COMPARISON:  MRI head 03/05/2014. FINDINGS: Brain: No acute intracranial hemorrhage. No CT evidence of acute infarct. Nonspecific hypoattenuation in the periventricular and subcortical white matter favored to reflect chronic microvascular ischemic changes. Mild parenchymal volume loss. No edema, mass effect, or midline shift. The basilar cisterns are patent. Ventricles: Prominence of the ventricles suggesting underlying parenchymal volume loss. Vascular:  Atherosclerotic calcifications of the carotid siphons. No hyperdense vessel. Skull: No acute or aggressive finding. Orbits: Orbits are symmetric. Sinuses: The visualized paranasal sinuses are clear. Other: Mastoid air cells are clear. IMPRESSION: No CT evidence of acute intracranial abnormality. Chronic microvascular ischemic changes and mild parenchymal volume loss. Electronically Signed   By: Denny Flack M.D.   On: 08/10/2023 13:23    Labs: Basic Metabolic Panel: Recent Labs  Lab 08/24/23 0358 08/25/23 0428 08/26/23 0658 08/27/23 0328  NA 140 136 136 136  K 4.2 4.9 4.7 4.6  CL 109 107 105 105  CO2 21* 16* 21* 17*  GLUCOSE 106* 154* 122* 84  BUN 26* 28* 26* 25*  CREATININE 1.07* 1.03* 0.98 1.08*  CALCIUM  9.2 9.2 9.1 9.1      Latest Ref Rng & Units 08/23/2023    3:59 AM 08/22/2023    5:24 AM 08/21/2023   10:53 AM  CBC  WBC 4.0 - 10.5 K/uL 5.6  6.5  7.6   Hemoglobin 12.0 - 15.0 g/dL 16.1  09.6  04.5   Hematocrit 36.0 - 46.0 % 39.5  39.5  41.0   Platelets 150 - 400 K/uL 285  299  313    Microbiology: Results for orders placed or performed during the hospital encounter of 08/21/23  Urine Culture     Status: Abnormal   Collection Time: 08/23/23  1:17 AM   Specimen: Urine, Random  Result Value Ref Range Status   Specimen Description   Final    URINE, RANDOM Performed at [facility], [address] [place], [place] [address]    Special Requests   Final    NONE Reflexed from [id number] Performed at [facility], [address] [place], [place] [address]    Culture (A)  Final    <10,000 COLONIES/mL INSIGNIFICANT GROWTH Performed at [facility], [address], [place], [place] [address]    Report Status 08/24/2023 FINAL  Final    Time coordinating discharge: Over 30 minutes  Ree Candy, MD  [facility] 08/30/2023, 12:31 PM

## 2023-08-30 NOTE — TOC Progression Note (Addendum)
 Transition of Care [facility]) - Progression Note    Patient Details  Name: [name] [name] MRN: [id number] Date of Birth: 11-16-1935  Transition of Care [facility]) CM/SW Contact  Baird Bombard, RN Phone Number: 08/30/2023, 1:06 PM  Clinical Narrative:    08:30am Retrieved a message from Francoise Ishihara, patient's daughter stating patient is a fall risk and she had spoken with someone at the facility at [facility] regarding a memory unit. Francoise Ishihara inquired about if information sent in the referral process addressess patient's status for falls and memory care.  10:37am Spoke with Jade from [facility]. Patient can be accepted today. Facility has STR and beds available for memory care   1:02pm Spoke with Gayle, patient's daughter to advised patient is being discharged. She stated she had spoken with a nurse today to advised she wanted to  appeal. RNCM advised the right to appeal is not effective until the discharge order is place. Francoise Ishihara was advised the order is in place. Francoise Ishihara stated she did not have the phone number RNCM provided the number to [facility] @ [phone].  2:25pm Retrieved incoming call from Carolyn Cisco, Clinical Reviewer, at [facility]. Carolyn Cisco stated patient family felt pressured about bed offer. Carolyn Cisco was advised of all options for discharge givien to patient's daughter. Carolyn Cisco  was advised memory care was not an option presented however [facility] has memory care and STR beds available. RNCM advised patient's daughter has been provided discharge plans for hospice per [facility] request, HH, and SNF with STR to LTC option as medicaid is pending and patient has three days at %100 left. Jamie was also informed patient was given other options for  her previous SNF [facility]) that would accept patient with co-payment upfront. Carolyn Cisco stated Francoise Ishihara was also inquiring about EMS transport. Carolyn Cisco was informed the hospital is no longer contracted with the previous EMS transport service for non emergency transport. Patient is  ambulatory and did not qualify for transport yesterday.   2:58pm Retrieved call from [facility] in admissions at [facility]. No female beds are available.   3:29pm Per TOC Asssitant, Nitchia, Appeal Confirmation was received. #[id number]   4:06pm Retrieved message from Jamie at [facility] stating she had spoken with Francoise Ishihara and she was ok with discharge until she spoke with someone(unnamed) at the facility who stated there are no memory care beds available at the facility.   3:53pm Spoke with Jade, Admissions Coordinator. Rodger Civil confirmed there are two female beds available at the facility. She stated she not spoken with anyone regarding female memory beds. Rodger Civil stated they have 24 hour rolling admissions. Patient can be accepted today. Discharge summary sent via HUB.   4:10pm Spoke with Gayle. She stated she had spoken with someone at [facility] and there is no memory care bed available. RNCM advised a STR bed or female bed is available for patient. Francoise Ishihara advised this was confirmed with the Admissions Coordinator, Jade. Francoise Ishihara stated she needed to end the call this RNCM so she wouldn't argue. Francoise Ishihara ended call.      Expected Discharge Plan:  (TBD) Barriers to Discharge: Continued Medical Work up  Expected Discharge Plan and Services     Post Acute Care Choice:  (TBD) Living arrangements for the past 2 months: Independent Living Facility Expected Discharge Date: 08/30/23                         HH Arranged: RN, PT, OT [facility] Agency: [facility] Date HH Agency Contacted:  08/22/23   Representative spoke with at [facility] Agency: Shaun   Social Determinants of Health (SDOH) Interventions SDOH Screenings   Food Insecurity: No Food Insecurity (08/21/2023)  Housing: Low Risk  (08/21/2023)  Transportation Needs: No Transportation Needs (08/21/2023)  Utilities: Not At Risk (08/21/2023)  Financial Resource Strain: Low Risk  (02/21/2023)   Received  from [facility]  Social Connections: Socially Isolated (08/21/2023)  Tobacco Use: Medium Risk (08/21/2023)    Readmission Risk Interventions    08/22/2023    2:18 PM  Readmission Risk Prevention Plan  Transportation Screening Complete  Medication Review (RN Care Manager) Complete  PCP or Specialist appointment within 3-5 days of discharge Complete  HRI or Home Care Consult Complete  SW Recovery Care/Counseling Consult Complete  Palliative Care Screening Not Applicable  Skilled Nursing Facility Not Complete  SNF Comments Will complete tomorrow if she will meet 3-night inpatient stay.

## 2023-08-30 NOTE — Plan of Care (Signed)
  Problem: Clinical Measurements: Goal: Ability to maintain clinical measurements within normal limits will improve Outcome: Progressing   Problem: Activity: Goal: Risk for activity intolerance will decrease Outcome: Progressing   Problem: Coping: Goal: Level of anxiety will decrease Outcome: Progressing   Problem: Elimination: Goal: Will not experience complications related to urinary retention Outcome: Progressing   Problem: Pain Managment: Goal: General experience of comfort will improve and/or be controlled Outcome: Progressing

## 2023-08-31 DIAGNOSIS — I214 Non-ST elevation (NSTEMI) myocardial infarction: Secondary | ICD-10-CM | POA: Diagnosis not present

## 2023-08-31 DIAGNOSIS — J189 Pneumonia, unspecified organism: Secondary | ICD-10-CM | POA: Diagnosis not present

## 2023-08-31 DIAGNOSIS — R531 Weakness: Secondary | ICD-10-CM | POA: Diagnosis not present

## 2023-08-31 NOTE — Plan of Care (Signed)
   Problem: Safety: Goal: Ability to remain free from injury will improve Outcome: Progressing

## 2023-08-31 NOTE — TOC Progression Note (Addendum)
 Transition of Care [facility]) - Progression Note    Patient Details  Name: [name] [name] MRN: [id number] Date of Birth: 30-Apr-1935  Transition of Care [facility]) CM/SW Contact  Baird Bombard, RN Phone Number: 08/31/2023, 10:44 AM  Clinical Narrative:    9:03am Spoke with Francoise Ishihara. Gayle requested referral be sent to [facility]. She was advised patient is discharged and subsequent searches are not performed. Francoise Ishihara stated "I work at [employer], not a Medicare guideline." Francoise Ishihara stated "I've had it with [facility] !" Mc[place]ister request to speak with [facility] supervisor.  RNCM advised TOC supervisor would contact her.  Michaeline Adolf, [facility] Supervisor notified.  12:24 Per HUB patient was accepted at [facility]. Attempt to reach admissions. No answer. Left a message for Sallyanne Creamer. Direct per VM to call [phone]. Attempted Rhonda at [phone]. No answer. Left a message.   12:47 PM Spoke with Sallyanne Creamer from [facility]. Patient can be accepted today. RNCM made MD aware and requested updated discharge summary per facility .    Expected Discharge Plan:  (TBD) Barriers to Discharge: Barriers Resolved  Expected Discharge Plan and Services     Post Acute Care Choice:  (TBD) Living arrangements for the past 2 months: Independent Living Facility Expected Discharge Date: 08/30/23                         HH Arranged: RN, PT, OT HH Agency: [facility] Date HH Agency Contacted: 08/22/23   Representative spoke with at [facility] Agency: Shaun   Social Determinants of Health (SDOH) Interventions SDOH Screenings   Food Insecurity: No Food Insecurity (08/21/2023)  Housing: Low Risk  (08/21/2023)  Transportation Needs: No Transportation Needs (08/21/2023)  Utilities: Not At Risk (08/21/2023)  Financial Resource Strain: Low Risk  (02/21/2023)   Received from [facility]  Social Connections: Socially Isolated (08/21/2023)  Tobacco Use:  Medium Risk (08/21/2023)    Readmission Risk Interventions    08/22/2023    2:18 PM  Readmission Risk Prevention Plan  Transportation Screening Complete  Medication Review (RN Care Manager) Complete  PCP or Specialist appointment within 3-5 days of discharge Complete  HRI or Home Care Consult Complete  SW Recovery Care/Counseling Consult Complete  Palliative Care Screening Not Applicable  Skilled Nursing Facility Not Complete  SNF Comments Will complete tomorrow if she will meet 3-night inpatient stay.

## 2023-08-31 NOTE — TOC Transition Note (Signed)
 Transition of Care [facility]) - Discharge Note   Patient Details  Name: [name] [name] MRN: [id number] Date of Birth: 07/15/1935  [facility]) CM/SW Contact:  Lyndon Chenoweth C Ritisha Deitrick, RN Phone Number: 08/31/2023, 3:30 PM   Clinical Narrative:   2:40pm Patient assigned to room #217 Nurse will call report to [phone]    2:50pm Patient daughter Francoise Ishihara provided appeal decision, detailed notice of discharge and HINN letter. Francoise Ishihara denied any questions. She was advised of medicare contacts to call for questions.   TOC signing off .   Final next level of care: Skilled Nursing Facility Barriers to Discharge: Barriers Resolved   Patient Goals and CMS Choice Patient states their goals for this hospitalization and ongoing recovery are:: SNF          Discharge Placement                Patient to be transferred to facility by: Daughter, Gayle   Patient and family notified of of transfer: 08/30/23  Discharge Plan and Services Additional resources added to the After Visit Summary for       Post Acute Care Choice:  (TBD)                    HH Arranged: RN, PT, OT HH Agency: [facility] Date HH Agency Contacted: 08/22/23   Representative spoke with at [facility] Agency: Shaun  Social Drivers of Health (SDOH) Interventions SDOH Screenings   Food Insecurity: No Food Insecurity (08/21/2023)  Housing: Low Risk  (08/21/2023)  Transportation Needs: No Transportation Needs (08/21/2023)  Utilities: Not At Risk (08/21/2023)  Financial Resource Strain: Low Risk  (02/21/2023)   Received from [facility]  Social Connections: Socially Isolated (08/21/2023)  Tobacco Use: Medium Risk (08/21/2023)     Readmission Risk Interventions    08/22/2023    2:18 PM  Readmission Risk Prevention Plan  Transportation Screening Complete  Medication Review (RN Care Manager) Complete  PCP or Specialist appointment within 3-5 days of discharge Complete  HRI  or Home Care Consult Complete  SW Recovery Care/Counseling Consult Complete  Palliative Care Screening Not Applicable  Skilled Nursing Facility Not Complete  SNF Comments Will complete tomorrow if she will meet 3-night inpatient stay.

## 2023-08-31 NOTE — Discharge Summary (Addendum)
 Physician Discharge Summary  Patient: [name] [name] UEA:[id number] DOB: [birth year]   Code Status: Limited: Do not attempt resuscitation (DNR) -DNR-LIMITED -Do Not Intubate/DNI  Admit date: 08/21/2023 Discharge date: 08/31/2023 Disposition: Skilled nursing facility, PT, OT, SLP, nurse aid, RN, social worker, and wound care PCP: Melchor Spoon, MD  Recommendations for Outpatient Follow-up:  Follow up with PCP within 1-2 weeks Regarding general hospital follow up and preventative care  Discharge Diagnoses:  Principal Problem:   NSTEMI (non-ST elevated myocardial infarction) [facility]) Active Problems:   Diabetes (HCC)   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)   Essential hypertension   PAD (peripheral artery disease) (HCC)   History of CVA (cerebrovascular accident)   Chronic HFrEF (heart failure with reduced ejection fraction) (HCC)   Pneumonia due to infectious organism   Weakness   Pulmonary hypertension, moderate to severe (HCC)   HFrEF (heart failure with reduced ejection fraction) [facility])   Ischemic cardiomyopathy  Brief Hospital Course Summary: [name] [name] is a 88yo Female with PMH of heart failure reduced EF, severe TR, type 2 diabetes, dementia, history of prior CVA, hyperlipidemia, who presented to the ED for generalized weakness.  Patient had recent EMS for chest pain and at that time required nitro for chest pain to resolve, she declined ED evaluation at that time.  On this admission patient was diagnosed with NSTEMI.  Cardiology was consulted.  Patient's troponin peaked at 3400.  Ultimately patient declined cath and cardiology advised medical management.  Patient completed 48 hours of IV heparin .  She has been continued on DAPT, Coreg , statin.  She does have advanced heart failure with an EF of 25% and severe mitral regurg. Palliative care was consulted given extensive comorbidities and declining complete workup.  Ultimately patient was not ready for hospice but wants  to proceed with palliative care outpatient.  TOC was consulted for SNF placement.  All other chronic conditions were treated with home medications.    Discharge Condition: Stable, improved Recommended discharge diet: Cardiac diet  Consultations: Cardiology Palliative   Procedures/Studies: None  Discharge Instructions     Call MD for:  difficulty breathing, headache or visual disturbances   Complete by: As directed    Call MD for:  persistant dizziness or light-headedness   Complete by: As directed    Call MD for:  persistant nausea and vomiting   Complete by: As directed    Call MD for:  severe uncontrolled pain   Complete by: As directed    Call MD for:  temperature >100.4   Complete by: As directed    Diet general   Complete by: As directed    Discharge instructions   Complete by: As directed    Follow up with your primary care physician to discuss the medication changes during this admission   Increase activity slowly   Complete by: As directed       Allergies as of 08/31/2023       Reactions   Codeine Other (See Comments)   "I go crazy"   Etodolac    Other reaction(s): Unknown   Indomethacin    Other reaction(s): Unknown   Penicillins Other (See Comments)   Does not remember this allergy Has patient had a PCN reaction causing immediate rash, facial/tongue/throat swelling, SOB or lightheadedness with hypotension: Unknown Has patient had a PCN reaction causing severe rash involving mucus membranes or skin necrosis: Unknown Has patient had a PCN reaction that required hospitalization: Unknown Has patient had  a PCN reaction occurring within the last 10 years: Unknown If all of the above answers are "NO", then may proceed with Cephalosporin use.   Tizanidine    Other reaction(s): Syncope Fell on the floor    Tramadol    Other reaction(s): Unknown        Medication List     STOP taking these medications    carvedilol  3.125 MG tablet Commonly known as:  COREG    metFORMIN  1000 MG tablet Commonly known as: GLUCOPHAGE    promethazine 12.5 MG tablet Commonly known as: PHENERGAN       TAKE these medications    allopurinol  100 MG tablet Commonly known as: ZYLOPRIM  Take 1 tablet by mouth daily.   aspirin  EC 81 MG tablet Take 1 tablet (81 mg total) by mouth daily. Swallow whole.   atorvastatin  40 MG tablet Commonly known as: LIPITOR Take 1 tablet (40 mg total) by mouth daily.   busPIRone  7.5 MG tablet Commonly known as: BUSPAR  Take 7.5 mg by mouth 2 (two) times daily.   clopidogrel  75 MG tablet Commonly known as: PLAVIX  Take 1 tablet (75 mg total) by mouth daily.   colchicine  0.6 MG tablet Take 0.6 mg by mouth daily as needed.   dapagliflozin  propanediol 10 MG Tabs tablet Commonly known as: FARXIGA  Take 1 tablet (10 mg total) by mouth daily.   furosemide  20 MG tablet Commonly known as: LASIX  Take 20 mg by mouth daily.   Loperamide  A-D 2 MG tablet Generic drug: loperamide  Take 2 mg by mouth as needed for diarrhea or loose stools.   losartan  25 MG tablet Commonly known as: COZAAR  Take 12.5 mg by mouth daily.   memantine  10 MG tablet Commonly known as: NAMENDA  Take 2 tablets by mouth every evening.   metoprolol  succinate 25 MG 24 hr tablet Commonly known as: TOPROL -XL Take 0.5 tablets (12.5 mg total) by mouth at bedtime.   nystatin powder Commonly known as: MYCOSTATIN/NYSTOP Apply 1 Application topically 3 (three) times daily.   risperiDONE  0.5 MG tablet Commonly known as: RISPERDAL  Take 0.5 mg by mouth 2 (two) times daily as needed. What changed: Another medication with the same name was added. Make sure you understand how and when to take each.   risperiDONE  0.25 MG tablet Commonly known as: RISPERDAL  Take 1 tablet (0.25 mg total) by mouth at bedtime. What changed: You were already taking a medication with the same name, and this prescription was added. Make sure you understand how and when to take  each.   sodium chloride  0.65 % Soln nasal spray Commonly known as: OCEAN Place 1 spray into both nostrils as needed for congestion.   spironolactone  25 MG tablet Commonly known as: ALDACTONE  Take 12.5 mg by mouth daily.        Contact information for after-discharge care     Destination     [facility] Preferred SNF .   Service: Skilled Nursing Contact information: [address] [place]  [address] [phone]                     Subjective   Pt reports no complaints. She denies CP, SOB. She appears to be sleeping while talking but answers questions appropriately. Denies complaints.   All questions and concerns were addressed at time of discharge.  Objective  Blood pressure (!) 125/95, pulse 83, temperature 97.6 F (36.4 C), resp. rate 17, height 5\' 5"  (1.651 m), weight 72.8 kg, SpO2 100%.   General: Pt is  awake and resting with eyes closed, not in acute distress. Alert to voice.  Cardiovascular: RRR, S1/S2 +, no rubs, no gallops Respiratory: CTA bilaterally, no wheezing, no rhonchi Abdominal: Soft, NT, ND, bowel sounds + Extremities: no edema, no cyanosis Flat mood  The results of significant diagnostics from this hospitalization (including imaging, microbiology, ancillary and laboratory) are listed below for reference.   Imaging studies: CT Head Wo Contrast Result Date: 08/21/2023 CLINICAL DATA:  Neck trauma EXAM: CT HEAD WITHOUT CONTRAST CT CERVICAL SPINE WITHOUT CONTRAST TECHNIQUE: Multidetector CT imaging of the head and cervical spine was performed following the standard protocol without intravenous contrast. Multiplanar CT image reconstructions of the cervical spine were also generated. RADIATION DOSE REDUCTION: This exam was performed according to the departmental dose-optimization program which includes automated exposure control, adjustment of the mA and/or kV according to patient size and/or use of  iterative reconstruction technique. COMPARISON:  07/14/2023 FINDINGS: CT HEAD FINDINGS Brain: No evidence of acute infarction, hemorrhage, hydrocephalus, extra-axial collection or mass lesion/mass effect. Pronounced generalized brain atrophy. Chronic small vessel ischemia in the deep cerebral white matter. Encephalomalacia in the cerebellum, usually post ischemic although centered in the midline. Vascular: No hyperdense vessel or unexpected calcification. Skull: Normal. Negative for fracture or focal lesion. Sinuses/Orbits: Negative CT CERVICAL SPINE FINDINGS Alignment: Normal. Skull base and vertebrae: No acute fracture. No primary bone lesion or focal pathologic process. Soft tissues and spinal canal: No prevertebral fluid or swelling. No visible canal hematoma. Disc levels:  Mild degenerative spurring. Upper chest: Small layering pleural effusions IMPRESSION: No evidence of acute intracranial or cervical spine injury. Electronically Signed   By: Ronnette Coke M.D.   On: 08/21/2023 12:13   CT Cervical Spine Wo Contrast Result Date: 08/21/2023 CLINICAL DATA:  Neck trauma EXAM: CT HEAD WITHOUT CONTRAST CT CERVICAL SPINE WITHOUT CONTRAST TECHNIQUE: Multidetector CT imaging of the head and cervical spine was performed following the standard protocol without intravenous contrast. Multiplanar CT image reconstructions of the cervical spine were also generated. RADIATION DOSE REDUCTION: This exam was performed according to the departmental dose-optimization program which includes automated exposure control, adjustment of the mA and/or kV according to patient size and/or use of iterative reconstruction technique. COMPARISON:  07/14/2023 FINDINGS: CT HEAD FINDINGS Brain: No evidence of acute infarction, hemorrhage, hydrocephalus, extra-axial collection or mass lesion/mass effect. Pronounced generalized brain atrophy. Chronic small vessel ischemia in the deep cerebral white matter. Encephalomalacia in the cerebellum,  usually post ischemic although centered in the midline. Vascular: No hyperdense vessel or unexpected calcification. Skull: Normal. Negative for fracture or focal lesion. Sinuses/Orbits: Negative CT CERVICAL SPINE FINDINGS Alignment: Normal. Skull base and vertebrae: No acute fracture. No primary bone lesion or focal pathologic process. Soft tissues and spinal canal: No prevertebral fluid or swelling. No visible canal hematoma. Disc levels:  Mild degenerative spurring. Upper chest: Small layering pleural effusions IMPRESSION: No evidence of acute intracranial or cervical spine injury. Electronically Signed   By: Ronnette Coke M.D.   On: 08/21/2023 12:13   DG Chest 2 View Result Date: 08/21/2023 CLINICAL DATA:  Weakness EXAM: CHEST - 2 VIEW COMPARISON:  July 09, 2023 FINDINGS: Bilateral pulmonary infiltrates with consolidative changes of the left lower lobe may correlate with pneumonia. Superimposed congestive changes?. Heart and mediastinum upper limits of normal IMPRESSION: Infiltrates as described could correlate with pneumonia Electronically Signed   By: Fredrich Jefferson M.D.   On: 08/21/2023 11:45   CT HEAD WO CONTRAST Result Date: 08/10/2023 CLINICAL DATA:  Head trauma, fall  this morning.  Dizziness. EXAM: CT HEAD WITHOUT CONTRAST TECHNIQUE: Contiguous axial images were obtained from the base of the skull through the vertex without intravenous contrast. RADIATION DOSE REDUCTION: This exam was performed according to the departmental dose-optimization program which includes automated exposure control, adjustment of the mA and/or kV according to patient size and/or use of iterative reconstruction technique. COMPARISON:  MRI head 03/05/2014. FINDINGS: Brain: No acute intracranial hemorrhage. No CT evidence of acute infarct. Nonspecific hypoattenuation in the periventricular and subcortical white matter favored to reflect chronic microvascular ischemic changes. Mild parenchymal volume loss. No edema, mass  effect, or midline shift. The basilar cisterns are patent. Ventricles: Prominence of the ventricles suggesting underlying parenchymal volume loss. Vascular: Atherosclerotic calcifications of the carotid siphons. No hyperdense vessel. Skull: No acute or aggressive finding. Orbits: Orbits are symmetric. Sinuses: The visualized paranasal sinuses are clear. Other: Mastoid air cells are clear. IMPRESSION: No CT evidence of acute intracranial abnormality. Chronic microvascular ischemic changes and mild parenchymal volume loss. Electronically Signed   By: Denny Flack M.D.   On: 08/10/2023 13:23    Labs: Basic Metabolic Panel: Recent Labs  Lab 08/25/23 0428 08/26/23 0658 08/27/23 0328  NA 136 136 136  K 4.9 4.7 4.6  CL 107 105 105  CO2 16* 21* 17*  GLUCOSE 154* 122* 84  BUN 28* 26* 25*  CREATININE 1.03* 0.98 1.08*  CALCIUM  9.2 9.1 9.1      Latest Ref Rng & Units 08/23/2023    3:59 AM 08/22/2023    5:24 AM 08/21/2023   10:53 AM  CBC  WBC 4.0 - 10.5 K/uL 5.6  6.5  7.6   Hemoglobin 12.0 - 15.0 g/dL 40.9  81.1  91.4   Hematocrit 36.0 - 46.0 % 39.5  39.5  41.0   Platelets 150 - 400 K/uL 285  299  313    Microbiology: Results for orders placed or performed during the hospital encounter of 08/21/23  Urine Culture     Status: Abnormal   Collection Time: 08/23/23  1:17 AM   Specimen: Urine, Random  Result Value Ref Range Status   Specimen Description   Final    URINE, RANDOM Performed at [facility], [address], [place], [place] [address]    Special Requests   Final    NONE Reflexed from [id number] Performed at [facility], [address] [place], [place] [address]    Culture (A)  Final    <10,000 COLONIES/mL INSIGNIFICANT GROWTH Performed at [facility], [address] [place], [place] [address]    Report Status 08/24/2023 FINAL  Final    Time coordinating discharge: Over 30 minutes  Ree Candy, MD  [facility] 08/31/2023, 2:53 PM

## 2023-11-26 ENCOUNTER — Emergency Department (HOSPITAL_COMMUNITY)

## 2023-11-26 ENCOUNTER — Emergency Department (HOSPITAL_COMMUNITY)
Admission: EM | Admit: 2023-11-26 | Discharge: 2023-11-26 | Disposition: A | Discharge status: Skilled Nursing Facility | Source: Skilled Nursing Facility | Attending: Emergency Medicine | Admitting: Emergency Medicine

## 2023-11-26 ENCOUNTER — Other Ambulatory Visit: Payer: Self-pay

## 2023-11-26 ENCOUNTER — Encounter (HOSPITAL_COMMUNITY): Payer: Self-pay

## 2023-11-26 DIAGNOSIS — I252 Old myocardial infarction: Secondary | ICD-10-CM | POA: Diagnosis not present

## 2023-11-26 DIAGNOSIS — Z7982 Long term (current) use of aspirin: Secondary | ICD-10-CM | POA: Diagnosis not present

## 2023-11-26 DIAGNOSIS — I700 Atherosclerosis of aorta: Secondary | ICD-10-CM | POA: Insufficient documentation

## 2023-11-26 DIAGNOSIS — R5383 Other fatigue: Secondary | ICD-10-CM | POA: Diagnosis present

## 2023-11-26 DIAGNOSIS — I4510 Unspecified right bundle-branch block: Secondary | ICD-10-CM | POA: Insufficient documentation

## 2023-11-26 DIAGNOSIS — F0390 Unspecified dementia without behavioral disturbance: Secondary | ICD-10-CM | POA: Insufficient documentation

## 2023-11-26 DIAGNOSIS — Z7902 Long term (current) use of antithrombotics/antiplatelets: Secondary | ICD-10-CM | POA: Insufficient documentation

## 2023-11-26 DIAGNOSIS — R7989 Other specified abnormal findings of blood chemistry: Secondary | ICD-10-CM | POA: Insufficient documentation

## 2023-11-26 DIAGNOSIS — N390 Urinary tract infection, site not specified: Secondary | ICD-10-CM | POA: Diagnosis not present

## 2023-11-26 LAB — CBC WITH DIFFERENTIAL/PLATELET
Abs Immature Granulocytes: 0.02 K/uL (ref 0.00–0.07)
Basophils Absolute: 0.1 K/uL (ref 0.0–0.1)
Basophils Relative: 1 %
Eosinophils Absolute: 0.4 K/uL (ref 0.0–0.5)
Eosinophils Relative: 6 %
HCT: 39.3 % (ref 36.0–46.0)
Hemoglobin: 12.6 g/dL (ref 12.0–15.0)
Immature Granulocytes: 0 %
Lymphocytes Relative: 16 %
Lymphs Abs: 1.1 K/uL (ref 0.7–4.0)
MCH: 30.1 pg (ref 26.0–34.0)
MCHC: 32.1 g/dL (ref 30.0–36.0)
MCV: 93.8 fL (ref 80.0–100.0)
Monocytes Absolute: 0.6 K/uL (ref 0.1–1.0)
Monocytes Relative: 8 %
Neutro Abs: 5 K/uL (ref 1.7–7.7)
Neutrophils Relative %: 69 %
Platelets: 332 K/uL (ref 150–400)
RBC: 4.19 MIL/uL (ref 3.87–5.11)
RDW: 17.3 % — ABNORMAL HIGH (ref 11.5–15.5)
WBC: 7.1 K/uL (ref 4.0–10.5)
nRBC: 0 % (ref 0.0–0.2)

## 2023-11-26 LAB — COMPREHENSIVE METABOLIC PANEL WITH GFR
ALT: 47 U/L — ABNORMAL HIGH (ref 0–44)
AST: 47 U/L — ABNORMAL HIGH (ref 15–41)
Albumin: 3 g/dL — ABNORMAL LOW (ref 3.5–5.0)
Alkaline Phosphatase: 147 U/L — ABNORMAL HIGH (ref 38–126)
Anion gap: 11 (ref 5–15)
BUN: 17 mg/dL (ref 8–23)
CO2: 21 mmol/L — ABNORMAL LOW (ref 22–32)
Calcium: 9.2 mg/dL (ref 8.9–10.3)
Chloride: 107 mmol/L (ref 98–111)
Creatinine, Ser: 1 mg/dL (ref 0.44–1.00)
GFR, Estimated: 55 mL/min — ABNORMAL LOW (ref >60)
Glucose, Bld: 143 mg/dL — ABNORMAL HIGH (ref 70–99)
Potassium: 3.8 mmol/L (ref 3.5–5.1)
Sodium: 139 mmol/L (ref 135–145)
Total Bilirubin: 1.2 mg/dL (ref 0.0–1.2)
Total Protein: 7 g/dL (ref 6.5–8.1)

## 2023-11-26 LAB — LIPASE, BLOOD: Lipase: 29 U/L (ref 11–51)

## 2023-11-26 LAB — URINALYSIS, W/ REFLEX TO CULTURE (INFECTION SUSPECTED)
Bacteria, UA: NONE SEEN
Bilirubin Urine: NEGATIVE
Glucose, UA: >=500 mg/dL — AB
Hgb urine dipstick: NEGATIVE
Ketones, ur: NEGATIVE mg/dL
Nitrite: NEGATIVE
Protein, ur: 100 mg/dL — AB
Specific Gravity, Urine: 1.032 — ABNORMAL HIGH (ref 1.005–1.030)
pH: 5 (ref 5.0–8.0)

## 2023-11-26 LAB — BRAIN NATRIURETIC PEPTIDE: B Natriuretic Peptide: 1489 pg/mL — ABNORMAL HIGH (ref 0.0–100.0)

## 2023-11-26 LAB — TROPONIN I (HIGH SENSITIVITY)
Troponin I (High Sensitivity): 324 ng/L (ref <18)
Troponin I (High Sensitivity): 311 ng/L (ref <18)

## 2023-11-26 LAB — RESP PANEL BY RT-PCR (RSV, FLU A&B, COVID)  RVPGX2
Influenza A by PCR: NEGATIVE
Influenza B by PCR: NEGATIVE
Resp Syncytial Virus by PCR: NEGATIVE
SARS Coronavirus 2 by RT PCR: NEGATIVE

## 2023-11-26 MED ORDER — CEPHALEXIN 500 MG PO CAPS: 500.0000 mg | ORAL_CAPSULE | Freq: Two times a day (BID) | ORAL | 0 refills | Status: DC

## 2023-11-26 MED ORDER — CEPHALEXIN 500 MG PO CAPS: 500.0000 mg | ORAL_CAPSULE | Freq: Two times a day (BID) | ORAL | 0 refills | Status: AC

## 2023-11-26 MED ORDER — CEPHALEXIN 250 MG PO CAPS
500.0000 mg | ORAL_CAPSULE | ORAL | Status: AC
  Administered 2023-11-26: 500 mg via ORAL
  Filled 2023-11-26: qty 2

## 2023-11-26 NOTE — ED Notes (Signed)
 PTAR scheduled for the patient to return to [facility]. The patient is 3rd on the pick-up list.

## 2023-11-26 NOTE — ED Notes (Signed)
 Called [facility] x 2 to give report; placed on hold by secretary; no answer when secretary connects this RN to the unit; Diplomatic Services operational officer states she will have unit RN call primary rn for report when time allows

## 2023-11-26 NOTE — ED Notes (Signed)
 Pt left in care of ptar in no new onset distress at this time. No questions from ptar.

## 2023-11-26 NOTE — ED Provider Notes (Signed)
 [place] EMERGENCY DEPARTMENT AT [facility] Provider Note   CSN: [id number] Arrival date & time: 11/26/23  1221     Patient presents with: Fatigue   [name] [name] is a 88 y.o. female.   88 year old female with prior medical history as detailed below presents for evaluation.  Patient arrives from [facility].  Patient's family noted that she was less responsive and less interactive than her baseline.  She has a history of dementia.  Patient without specific acute complaint at time of my evaluation.  The history is provided by the patient, the EMS personnel, a relative and medical records.       Prior to Admission medications   Medication Sig Start Date End Date Taking? Authorizing Provider  allopurinol  (ZYLOPRIM ) 100 MG tablet Take 1 tablet by mouth daily. 03/05/23   [provider]  aspirin  EC 81 MG tablet Take 1 tablet (81 mg total) by mouth daily. Swallow whole. 04/04/23   Alexander, Natalie, DO  atorvastatin  (LIPITOR) 40 MG tablet Take 1 tablet (40 mg total) by mouth daily. 08/30/23   Lenon Marien CROME, MD  busPIRone  (BUSPAR ) 7.5 MG tablet Take 7.5 mg by mouth 2 (two) times daily. 07/10/23 07/09/24  [provider]  clopidogrel  (PLAVIX ) 75 MG tablet Take 1 tablet (75 mg total) by mouth daily. 08/30/23   Lenon Marien CROME, MD  colchicine  0.6 MG tablet Take 0.6 mg by mouth daily as needed. 08/12/23   [provider]  dapagliflozin  propanediol (FARXIGA ) 10 MG TABS tablet Take 1 tablet (10 mg total) by mouth daily. 07/20/23   Dorinda Drue DASEN, MD  furosemide  (LASIX ) 20 MG tablet Take 20 mg by mouth daily. 08/04/23   [provider]  loperamide  (LOPERAMIDE  A-D) 2 MG tablet Take 2 mg by mouth as needed for diarrhea or loose stools.    [provider]  losartan  (COZAAR ) 25 MG tablet Take 12.5 mg by mouth daily. 08/12/23   [provider]  memantine  (NAMENDA ) 10 MG tablet Take 2 tablets by mouth every evening.  10/26/15    [provider]  metoprolol  succinate (TOPROL -XL) 25 MG 24 hr tablet Take 0.5 tablets (12.5 mg total) by mouth at bedtime. 08/30/23 09/29/23  Lenon Marien CROME, MD  nystatin (MYCOSTATIN/NYSTOP) powder Apply 1 Application topically 3 (three) times daily. 08/04/23   [provider]  risperiDONE  (RISPERDAL ) 0.25 MG tablet Take 1 tablet (0.25 mg total) by mouth at bedtime. 08/30/23 09/29/23  Lenon Marien CROME, MD  risperiDONE  (RISPERDAL ) 0.5 MG tablet Take 0.5 mg by mouth 2 (two) times daily as needed. 08/15/23   [provider]  sodium chloride  (OCEAN) 0.65 % SOLN nasal spray Place 1 spray into both nostrils as needed for congestion. Patient not taking: Reported on 08/21/2023    [provider]  spironolactone  (ALDACTONE ) 25 MG tablet Take 12.5 mg by mouth daily. 08/12/23   [provider]    Allergies: Codeine, Etodolac, Indomethacin, Penicillins, Tizanidine, and Tramadol    Review of Systems  All other systems reviewed and are negative.   Updated Vital Signs BP 112/79 (BP Location: Right Arm)   Pulse 76   Temp 98.4 F (36.9 C) (Oral)   Resp 14   Ht 5' 5 (1.651 m)   Wt 72 kg   SpO2 100%   BMI 26.41 kg/m   Physical Exam Vitals and nursing note reviewed.  Constitutional:      General: She is not in acute distress.    Appearance: Normal appearance. She  is well-developed.  HENT:     Head: Normocephalic and atraumatic.  Eyes:     Conjunctiva/sclera: Conjunctivae normal.     Pupils: Pupils are equal, round, and reactive to light.  Cardiovascular:     Rate and Rhythm: Normal rate and regular rhythm.     Heart sounds: Normal heart sounds.  Pulmonary:     Effort: Pulmonary effort is normal. No respiratory distress.     Breath sounds: Normal breath sounds.  Abdominal:     General: There is no distension.     Palpations: Abdomen is soft.     Tenderness: There is no abdominal tenderness.  Musculoskeletal:        General: No deformity. Normal  range of motion.     Cervical back: Normal range of motion and neck supple.  Skin:    General: Skin is warm and dry.  Neurological:     General: No focal deficit present.     Mental Status: She is alert and oriented to person, place, and time.     (all labs ordered are listed, but only abnormal results are displayed) Labs Reviewed  CBC WITH DIFFERENTIAL/PLATELET - Abnormal; Notable for the following components:      Result Value   RDW 17.3 (*)    All other components within normal limits  RESP PANEL BY RT-PCR (RSV, FLU A&B, COVID)  [id number]  COMPREHENSIVE METABOLIC PANEL WITH GFR  LIPASE, BLOOD  URINALYSIS, W/ REFLEX TO CULTURE (INFECTION SUSPECTED)  BRAIN NATRIURETIC PEPTIDE  TROPONIN I (HIGH SENSITIVITY)    EKG: EKG Interpretation Date/Time:  Sunday November 26 2023 12:33:57 EDT Ventricular Rate:  79 PR Interval:  161 QRS Duration:  124 QT Interval:  450 QTC Calculation: 516 R Axis:   -78  Text Interpretation: Sinus rhythm Right bundle branch block Inferior infarct, old Confirmed by Laurice Coy [phone]) on 11/26/2023 12:36:22 PM  Radiology: CT Head Wo Contrast Result Date: 11/26/2023 CLINICAL DATA:  Mental status change, unknown cause. Increased fatigue. Pt's daughter called EMS stating pt not the same as usual, seems more tired from baseline. Pt AANDOx3-4. Hx dementia. Pt c/o abdominal pain. EXAM: CT HEAD WITHOUT CONTRAST TECHNIQUE: Contiguous axial images were obtained from the base of the skull through the vertex without intravenous contrast. RADIATION DOSE REDUCTION: This exam was performed according to the departmental dose-optimization program which includes automated exposure control, adjustment of the mA and/or kV according to patient size and/or use of iterative reconstruction technique. COMPARISON:  CT head 08/21/2023. FINDINGS: Brain: Cerebral ventricle sizes are concordant with the degree of cerebral volume loss. Patchy and confluent areas of decreased attenuation  are noted throughout the deep and periventricular white matter of the cerebral hemispheres bilaterally, compatible with chronic microvascular ischemic disease. No evidence of large-territorial acute infarction. No parenchymal hemorrhage. No mass lesion. No extra-axial collection. No mass effect or midline shift. No hydrocephalus. Basilar cisterns are patent. Vascular: No hyperdense vessel. Atherosclerotic calcifications are present within the cavernous internal carotid arteries. Skull: No acute fracture or focal lesion. Sinuses/Orbits: Paranasal sinuses and mastoid air cells are clear. Bilateral lens replacement. Otherwise the orbits are unremarkable. Other: None. IMPRESSION: No acute intracranial abnormality. Electronically Signed   By: Morgane  Naveau M.D.   On: 11/26/2023 13:15   DG Chest Port 1 View Result Date: 11/26/2023 CLINICAL DATA:  weakness EXAM: PORTABLE CHEST 1 VIEW COMPARISON:  Chest x-ray 08/21/2023, CT chest 07/14/2023 FINDINGS: Patient is rotated. The heart and mediastinal contours are unchanged. Atherosclerotic plaque. No focal consolidation. Chronic coarsened  interstitial marking with no overt pulmonary edema. No pleural effusion. No pneumothorax. No acute osseous abnormality. IMPRESSION: 1. No active disease. Slightly limited evaluation due to patient rotation. 2.  Aortic Atherosclerosis (ICD10-I70.0). Electronically Signed   By: Morgane  Naveau M.D.   On: 11/26/2023 13:13     Procedures   Medications Ordered in the ED - No data to display  Clinical Course as of 11/26/23 1558  Sun Nov 26, 2023  1512 Assumed care from Dr Laurice. 88 yo F hx of dementia and wasn't acting normally. Just says I don't feel good. Does have elevated troponin. Did have NSTEMI in march and declined PCI. Getting second troponin now. No focal neuro deficits. May benefit from obs.  [RP]  1543 Troponin has decreased from 324 to 311.  Is markedly better than 3 months ago when her troponin was in the 3000's.  [RP]  1545 Has been having increased urination. Urinalysis is equivocal but with her change in mentation will treat for UTI.  [RP]  1550 Patient reassessed.  She is doing well.  Requesting go back to her facility.  Not having chest pain this point in time.  Discussed the results of her marginally elevated LFTs as well as her elevated with downtrending troponin with the patient and her daughter.  She still does not wish to have a heart catheterization or undergo any further invasive testing or treatment.  Not having any right upper quadrant abdominal pain.  Will give her Keflex  and have her follow-up with her primary doctor as an outpatient. [RP]    Clinical Course User Index [RP] Yolande Lamar BROCKS, MD                                 Medical Decision Making Patient brought to the ED from [facility] evaluation by family.  Family reports that the patient is not taking her normal self.  She appeared to family to be less interactive than normal.  Patient without specific complaint on initial evaluation.  Screening labs, imaging, workup initiated.  Oncoming ED provider for case.  Amount and/or Complexity of Data Reviewed Labs: ordered. Radiology: ordered.  Risk Prescription drug management.        Final diagnoses:  Other fatigue  Urinary tract infection without hematuria, site unspecified    ED Discharge Orders     None          Laurice Maude BROCKS, MD 11/26/23 [phone]

## 2023-11-26 NOTE — Discharge Instructions (Addendum)
 You were seen for your urinary tract infection in the emergency department.   At home, please take the antibiotics we prescribed you.    Check your MyChart online for the results of any tests that had not resulted by the time you left the emergency department.   Follow-up with your primary doctor in 2-3 days regarding your visit.    Return immediately to the emergency department if you experience any of the following: Worsening weakness, fever, or any other concerning symptoms.    Thank you for visiting our Emergency Department. It was a pleasure taking care of you today.

## 2023-11-26 NOTE — ED Triage Notes (Signed)
 Pt to ED via EMS from SNF with c/o increased fatigue. Pt's daughter called EMS stating pt not the same as usual, seems more tired from baseline. Pt A&Ox3-4. Hx dementia. Pt c/o abdominal pain.

## 2023-11-26 NOTE — ED Provider Notes (Signed)
  Physical Exam  BP 119/79   Pulse 84   Temp 98.4 F (36.9 C) (Oral)   Resp 16   Ht 5' 5 (1.651 m)   Wt 72 kg   SpO2 97%   BMI 26.41 kg/m   Physical Exam  Procedures  Procedures  ED Course / MDM   Clinical Course as of 11/26/23 1553  Sun Nov 26, 2023  1512 Assumed care from Dr Laurice. 88 yo F hx of dementia and wasn't acting normally. Just says I don't feel good. Does have elevated troponin. Did have NSTEMI in march and declined PCI. Getting second troponin now. No focal neuro deficits. May benefit from obs.  [RP]  1543 Troponin has decreased from 324 to 311.  Is markedly better than 3 months ago when her troponin was in the 3000's. [RP]  1545 Has been having increased urination. Urinalysis is equivocal but with her change in mentation will treat for UTI.  [RP]  1550 Patient reassessed.  She is doing well.  Requesting go back to her facility.  Not having chest pain this point in time.  Discussed the results of her marginally elevated LFTs as well as her elevated with downtrending troponin with the patient and her daughter.  She still does not wish to have a heart catheterization or undergo any further invasive testing or treatment.  Not having any right upper quadrant abdominal pain.  Will give her Keflex  and have her follow-up with her primary doctor as an outpatient. [RP]    Clinical Course User Index [RP] Yolande Lamar BROCKS, MD   Medical Decision Making Amount and/or Complexity of Data Reviewed Labs: ordered. Radiology: ordered.  Risk Prescription drug management.      Yolande Lamar BROCKS, MD 11/26/23 [phone]

## 2023-11-27 LAB — URINE CULTURE: Culture: <10000 — AB

## 2023-12-01 ENCOUNTER — Encounter (HOSPITAL_COMMUNITY): Payer: Self-pay

## 2023-12-01 ENCOUNTER — Emergency Department (HOSPITAL_COMMUNITY)
Admission: EM | Admit: 2023-12-01 | Discharge: 2023-12-01 | Disposition: A | Discharge status: Skilled Nursing Facility | Source: Skilled Nursing Facility | Attending: Emergency Medicine | Admitting: Emergency Medicine

## 2023-12-01 ENCOUNTER — Other Ambulatory Visit: Payer: Self-pay

## 2023-12-01 DIAGNOSIS — Z8673 Personal history of transient ischemic attack (TIA), and cerebral infarction without residual deficits: Secondary | ICD-10-CM | POA: Diagnosis not present

## 2023-12-01 DIAGNOSIS — Z79899 Other long term (current) drug therapy: Secondary | ICD-10-CM | POA: Diagnosis not present

## 2023-12-01 DIAGNOSIS — Z7982 Long term (current) use of aspirin: Secondary | ICD-10-CM | POA: Insufficient documentation

## 2023-12-01 DIAGNOSIS — Z7902 Long term (current) use of antithrombotics/antiplatelets: Secondary | ICD-10-CM | POA: Diagnosis not present

## 2023-12-01 DIAGNOSIS — E119 Type 2 diabetes mellitus without complications: Secondary | ICD-10-CM | POA: Insufficient documentation

## 2023-12-01 DIAGNOSIS — I10 Essential (primary) hypertension: Secondary | ICD-10-CM | POA: Insufficient documentation

## 2023-12-01 DIAGNOSIS — M791 Myalgia, unspecified site: Secondary | ICD-10-CM | POA: Insufficient documentation

## 2023-12-01 DIAGNOSIS — I4510 Unspecified right bundle-branch block: Secondary | ICD-10-CM | POA: Diagnosis not present

## 2023-12-01 LAB — URINALYSIS, ROUTINE W REFLEX MICROSCOPIC
Bilirubin Urine: NEGATIVE
Glucose, UA: >=500 mg/dL — AB
Hgb urine dipstick: NEGATIVE
Ketones, ur: NEGATIVE mg/dL
Nitrite: NEGATIVE
Protein, ur: >=300 mg/dL — AB
Specific Gravity, Urine: 1.036 — ABNORMAL HIGH (ref 1.005–1.030)
pH: 5 (ref 5.0–8.0)

## 2023-12-01 LAB — COMPREHENSIVE METABOLIC PANEL WITH GFR
ALT: 57 U/L — ABNORMAL HIGH (ref 0–44)
AST: 57 U/L — ABNORMAL HIGH (ref 15–41)
Albumin: 3.3 g/dL — ABNORMAL LOW (ref 3.5–5.0)
Alkaline Phosphatase: 167 U/L — ABNORMAL HIGH (ref 38–126)
Anion gap: 12 (ref 5–15)
BUN: 14 mg/dL (ref 8–23)
CO2: 22 mmol/L (ref 22–32)
Calcium: 9.7 mg/dL (ref 8.9–10.3)
Chloride: 107 mmol/L (ref 98–111)
Creatinine, Ser: 0.88 mg/dL (ref 0.44–1.00)
GFR, Estimated: >60 mL/min (ref >60)
Glucose, Bld: 158 mg/dL — ABNORMAL HIGH (ref 70–99)
Potassium: 4.2 mmol/L (ref 3.5–5.1)
Sodium: 141 mmol/L (ref 135–145)
Total Bilirubin: 1.5 mg/dL — ABNORMAL HIGH (ref 0.0–1.2)
Total Protein: 7.9 g/dL (ref 6.5–8.1)

## 2023-12-01 LAB — CBC
HCT: 42.1 % (ref 36.0–46.0)
Hemoglobin: 13.2 g/dL (ref 12.0–15.0)
MCH: 29.7 pg (ref 26.0–34.0)
MCHC: 31.4 g/dL (ref 30.0–36.0)
MCV: 94.8 fL (ref 80.0–100.0)
Platelets: 301 K/uL (ref 150–400)
RBC: 4.44 MIL/uL (ref 3.87–5.11)
RDW: 17.1 % — ABNORMAL HIGH (ref 11.5–15.5)
WBC: 6.2 K/uL (ref 4.0–10.5)
nRBC: 0 % (ref 0.0–0.2)

## 2023-12-01 LAB — RESP PANEL BY RT-PCR (RSV, FLU A&B, COVID)  RVPGX2
Influenza A by PCR: NEGATIVE
Influenza B by PCR: NEGATIVE
Resp Syncytial Virus by PCR: NEGATIVE
SARS Coronavirus 2 by RT PCR: NEGATIVE

## 2023-12-01 LAB — CK: Total CK: 36 U/L — ABNORMAL LOW (ref 38–234)

## 2023-12-01 MED ORDER — ACETAMINOPHEN 500 MG PO TABS
1000.0000 mg | ORAL_TABLET | Freq: Once | ORAL | Status: AC
  Administered 2023-12-01: 1000 mg via ORAL
  Filled 2023-12-01: qty 2

## 2023-12-01 MED ORDER — LACTATED RINGERS IV BOLUS
500.0000 mL | Freq: Once | INTRAVENOUS | Status: AC
  Administered 2023-12-01: 500 mL via INTRAVENOUS

## 2023-12-01 NOTE — Discharge Instructions (Addendum)
 Please follow up with your primary care provider.

## 2023-12-01 NOTE — ED Notes (Signed)
 Pt ate lunch that her daughter brought her.

## 2023-12-01 NOTE — ED Triage Notes (Addendum)
 BIBA from [facility].  Generalized weakness and buttocks pain 10/10.  Upon arrival she reported chest pain.  Pain started at 0500 this morning. Pain described as cutting my head in two  130/78 HR 86 RR 16 98% RA CBG 181

## 2023-12-01 NOTE — ED Notes (Signed)
 Inc care; full linen and gown change.

## 2023-12-01 NOTE — ED Notes (Signed)
 Upon arrival pt angry that she was at [facility] long and not [facility]. Pt on the phone with her daughter demanding that I speak with her. I instructed pt that I needed to provide her care and that I had no control over where EMS brings pt. Pt demanding last name. I told pt I do not provide my last name due to safety for myself. Pt on the phone with daughter  I will fight all these people. I do not want to be in the God damn building.  Pt hooked up to obtain vitals signs and charge RN made aware of conversation.

## 2023-12-01 NOTE — ED Notes (Signed)
 Attempted to contact [facility] and waited on hold over 5 min with no answer.

## 2023-12-01 NOTE — ED Provider Notes (Cosign Needed Addendum)
 [name] [name] Provider Note   CSN: [id number] Arrival date & time: 12/01/23  9374     Patient presents with: Generalized Body Aches   [name] [name] is a 88 y.o. female.  HPI  Patient is an 88 year old female with past medical history significant for DM2, HTN, stroke  Patient presents emergency room today with complaints of bilateral leg pain, buttocks pain and pain all over she denies any chest pain although this was noted in her triage note.  She states that her pain is in her buttocks bilateral legs.  She states that it is achy constant discomfort. She denies any wounds. No fever or chills. No falls, trauma or recent traumatic injuries.      Prior to Admission medications   Medication Sig Start Date End Date Taking? Authorizing Provider  allopurinol  (ZYLOPRIM ) 100 MG tablet Take 1 tablet by mouth daily. 03/05/23   [provider]  aspirin  EC 81 MG tablet Take 1 tablet (81 mg total) by mouth daily. Swallow whole. 04/04/23   Alexander, Natalie, DO  atorvastatin  (LIPITOR) 40 MG tablet Take 1 tablet (40 mg total) by mouth daily. 08/30/23   Lenon Marien CROME, MD  busPIRone  (BUSPAR ) 7.5 MG tablet Take 7.5 mg by mouth 2 (two) times daily. 07/10/23 07/09/24  [provider]  cephALEXin  (KEFLEX ) 500 MG capsule Take 1 capsule (500 mg total) by mouth 2 (two) times daily for 7 days. 11/26/23 12/03/23  Yolande Lamar BROCKS, MD  clopidogrel  (PLAVIX ) 75 MG tablet Take 1 tablet (75 mg total) by mouth daily. 08/30/23   Lenon Marien CROME, MD  colchicine  0.6 MG tablet Take 0.6 mg by mouth daily as needed. 08/12/23   [provider]  dapagliflozin  propanediol (FARXIGA ) 10 MG TABS tablet Take 1 tablet (10 mg total) by mouth daily. 07/20/23   Dorinda Drue DASEN, MD  furosemide  (LASIX ) 20 MG tablet Take 20 mg by mouth daily. 08/04/23   [provider]  loperamide  (LOPERAMIDE  A-D) 2 MG tablet Take 2 mg by mouth as needed for diarrhea  or loose stools.    [provider]  losartan  (COZAAR ) 25 MG tablet Take 12.5 mg by mouth daily. 08/12/23   [provider]  memantine  (NAMENDA ) 10 MG tablet Take 2 tablets by mouth every evening.  10/26/15   [provider]  metoprolol  succinate (TOPROL -XL) 25 MG 24 hr tablet Take 0.5 tablets (12.5 mg total) by mouth at bedtime. 08/30/23 09/29/23  Lenon Marien CROME, MD  nystatin (MYCOSTATIN/NYSTOP) powder Apply 1 Application topically 3 (three) times daily. 08/04/23   [provider]  risperiDONE  (RISPERDAL ) 0.25 MG tablet Take 1 tablet (0.25 mg total) by mouth at bedtime. 08/30/23 09/29/23  Lenon Marien CROME, MD  risperiDONE  (RISPERDAL ) 0.5 MG tablet Take 0.5 mg by mouth 2 (two) times daily as needed. 08/15/23   [provider]  sodium chloride  (OCEAN) 0.65 % SOLN nasal spray Place 1 spray into both nostrils as needed for congestion. Patient not taking: Reported on 08/21/2023    [provider]  spironolactone  (ALDACTONE ) 25 MG tablet Take 12.5 mg by mouth daily. 08/12/23   [provider]    Allergies: Codeine, Etodolac, Indomethacin, Penicillins, Tizanidine, and Tramadol    Review of Systems  Updated Vital Signs BP 122/79   Pulse 78   Temp 98 F (36.7 C) (Oral)   Resp 20   Ht 5' 5 (1.651 m)   Wt 64.4 kg   SpO2 98%   BMI  23.63 kg/m   Physical Exam Vitals and nursing note reviewed.  Constitutional:      General: She is not in acute distress. HENT:     Head: Normocephalic and atraumatic.     Nose: Nose normal.     Mouth/Throat:     Mouth: Mucous membranes are moist.  Eyes:     General: No scleral icterus. Cardiovascular:     Rate and Rhythm: Normal rate and regular rhythm.     Pulses: Normal pulses.     Heart sounds: Normal heart sounds.  Pulmonary:     Effort: Pulmonary effort is normal. No respiratory distress.     Breath sounds: No wheezing.  Abdominal:     Palpations: Abdomen is soft.     Tenderness: There is  no abdominal tenderness. There is no right CVA tenderness, left CVA tenderness, guarding or rebound.  Musculoskeletal:     Cervical back: Normal range of motion.     Right lower leg: No edema.     Left lower leg: No edema.     Comments: DP/PT pulses 3+ and symmetric  Legs symmetric   Skin:    General: Skin is warm and dry.     Capillary Refill: Capillary refill takes less than 2 seconds.     Comments: No wounds.   Neurological:     Mental Status: She is alert. Mental status is at baseline.  Psychiatric:        Mood and Affect: Mood normal.        Behavior: Behavior normal.     (all labs ordered are listed, but only abnormal results are displayed) Labs Reviewed  CBC - Abnormal; Notable for the following components:      Result Value   RDW 17.1 (*)    All other components within normal limits  COMPREHENSIVE METABOLIC PANEL WITH GFR - Abnormal; Notable for the following components:   Glucose, Bld 158 (*)    Albumin 3.3 (*)    AST 57 (*)    ALT 57 (*)    Alkaline Phosphatase 167 (*)    Total Bilirubin 1.5 (*)    All other components within normal limits  URINALYSIS, ROUTINE W REFLEX MICROSCOPIC - Abnormal; Notable for the following components:   APPearance HAZY (*)    Specific Gravity, Urine 1.036 (*)    Glucose, UA >=500 (*)    Protein, ur >=300 (*)    Leukocytes,Ua SMALL (*)    Bacteria, UA RARE (*)    All other components within normal limits  CK - Abnormal; Notable for the following components:   Total CK 36 (*)    All other components within normal limits  RESP PANEL BY RT-PCR (RSV, FLU A&B, COVID)  [id number]    EKG: EKG Interpretation Date/Time:  Friday December 01 2023 07:45:22 EDT Ventricular Rate:  82 PR Interval:  151 QRS Duration:  130 QT Interval:  462 QTC Calculation: 540 R Axis:   -76  Text Interpretation: Sinus rhythm Right bundle branch block Probable anterior infarct, age indeterminate No significant change since last tracing Confirmed by Dasie Faden ([id number]) on 12/01/2023 10:37:50 AM  Radiology: No results found.   Procedures   Medications Ordered in the ED  acetaminophen  (TYLENOL ) tablet 1,000 mg (1,000 mg Oral Given 12/01/23 0730)  lactated ringers  bolus 500 mL (0 mLs Intravenous Stopped 12/01/23 1310)    Clinical Course as of 12/01/23 1355  Fri Dec 01, 2023  0701 Primarily legs that are bothering her.  Some  nausea without vomiting.   [WF]    Clinical Course User Index [WF] Neldon Hamp RAMAN, PA                                 Medical Decision Making Amount and/or Complexity of Data Reviewed Labs: ordered.  Risk OTC drugs.   Patient is an 88 year old female with past medical history significant for DM2, HTN, stroke  Patient presents emergency room today with complaints of bilateral leg pain, buttocks pain and pain all over she denies any chest pain although this was noted in her triage note.  She states that her pain is in her buttocks bilateral legs.  She states that it is achy constant discomfort. She denies any wounds. No fever or chills. No falls, trauma or recent traumatic injuries.   CK nml, CMP similar to last, urine shows dehydration and not particularly concern for infection I reviewed patient's last ER evaluation for fatigue it is needed at she was treated for UTI although urine culture revealed less than 10 colony-forming units per unit.  She was ultimately told to discontinue antibiotics due to unlikely UTI.  Here today she denies any dysuria frequency or urgency.  Notably patient is taking Farxiga  and does note that she urinates frequently on this however no changes in her urinary frequency.  She denies any abdominal pain or chest pain.  Patient was given Tylenol  500 mL of crystalloid through IV.  On reevaluation patient states she feels much improved.  Daughter at bedside confirms that patient is at her mental baseline she does have dementia and resides at [facility].   Final diagnoses:   Myalgia    ED Discharge Orders     None          Neldon Hamp RAMAN, [place] 12/01/23 1359    Dasie Faden, MD 12/01/23 1435  ADDENDUM:   Patient is an 88 year old female who presented emergency room for myalgias/body aches.  A COVID/RSV/influenza test was necessary to assess for 1 of these viral illnesses.    Neldon Hamp [place], [place] 12/19/23 1543    Dasie Faden, MD 12/20/23 1133

## 2023-12-01 NOTE — ED Notes (Signed)
 Patient is 6th on the PTAR list.

## 2023-12-01 NOTE — ED Notes (Signed)
 Called [facility] for transport for pt to go back to [facility].  Was told it could be 2 hrs.

## 2023-12-01 NOTE — ED Notes (Signed)
 Pt's daughter brought pt lunch.

## 2023-12-08 ENCOUNTER — Emergency Department (HOSPITAL_COMMUNITY)
Admission: EM | Admit: 2023-12-08 | Discharge: 2023-12-08 | Disposition: A | Discharge status: Home / Self Care | Attending: Emergency Medicine | Admitting: Emergency Medicine

## 2023-12-08 ENCOUNTER — Encounter (HOSPITAL_COMMUNITY): Payer: Self-pay

## 2023-12-08 DIAGNOSIS — W19XXXA Unspecified fall, initial encounter: Secondary | ICD-10-CM

## 2023-12-08 DIAGNOSIS — W01198A Fall on same level from slipping, tripping and stumbling with subsequent striking against other object, initial encounter: Secondary | ICD-10-CM | POA: Diagnosis not present

## 2023-12-08 DIAGNOSIS — Z7982 Long term (current) use of aspirin: Secondary | ICD-10-CM | POA: Insufficient documentation

## 2023-12-08 DIAGNOSIS — F0390 Unspecified dementia without behavioral disturbance: Secondary | ICD-10-CM | POA: Diagnosis not present

## 2023-12-08 DIAGNOSIS — S0990XA Unspecified injury of head, initial encounter: Secondary | ICD-10-CM | POA: Insufficient documentation

## 2023-12-08 LAB — CBC
HCT: 42 % (ref 36.0–46.0)
Hemoglobin: 13.7 g/dL (ref 12.0–15.0)
MCH: 30.4 pg (ref 26.0–34.0)
MCHC: 32.6 g/dL (ref 30.0–36.0)
MCV: 93.3 fL (ref 80.0–100.0)
Platelets: 279 K/uL (ref 150–400)
RBC: 4.5 MIL/uL (ref 3.87–5.11)
RDW: 16.9 % — ABNORMAL HIGH (ref 11.5–15.5)
WBC: 5.8 K/uL (ref 4.0–10.5)
nRBC: 0 % (ref 0.0–0.2)

## 2023-12-08 LAB — BASIC METABOLIC PANEL WITH GFR
Anion gap: 12 (ref 5–15)
BUN: 21 mg/dL (ref 8–23)
CO2: 18 mmol/L — ABNORMAL LOW (ref 22–32)
Calcium: 9.2 mg/dL (ref 8.9–10.3)
Chloride: 108 mmol/L (ref 98–111)
Creatinine, Ser: 1.06 mg/dL — ABNORMAL HIGH (ref 0.44–1.00)
GFR, Estimated: 51 mL/min — ABNORMAL LOW (ref >60)
Glucose, Bld: 170 mg/dL — ABNORMAL HIGH (ref 70–99)
Potassium: 4 mmol/L (ref 3.5–5.1)
Sodium: 138 mmol/L (ref 135–145)

## 2023-12-08 MED ORDER — ACETAMINOPHEN 500 MG PO TABS
1000.0000 mg | ORAL_TABLET | Freq: Once | ORAL | Status: AC
  Administered 2023-12-08: 1000 mg via ORAL
  Filled 2023-12-08: qty 2

## 2023-12-08 NOTE — ED Triage Notes (Signed)
 Pt bibems from [place]. Pt has had increased agitation the past 2 days. Yesterday 1700, fall from standing. She hit her head on a chair. No thinners, no LOC. No hematoma or tenderness to area. Pt only endorses general head pain in the back per EMS. Daughter has been notified.

## 2023-12-08 NOTE — ED Notes (Signed)
 [facility] called

## 2023-12-08 NOTE — ED Provider Notes (Signed)
 [place] EMERGENCY DEPARTMENT AT [facility] Provider Note   CSN: [id number] Arrival date & time: 12/08/23  1503     Patient presents with: [name] [name] is a 88 y.o. female.   HPI Aides were bathing and changing the patient yesterday.  It about 1700 she was apparently in a standing position and as they were assisting she fell backward and hit her head on a chair.  No loss of consciousness.  No blood thinners.  Reportedly someone noted hematoma to the area.  Today the patient's daughter visited and wished for her to get medical assessment.  Patient's daughter also was concerned about some oversedation due to a change in the patient's Risperdal  which had been nightly and was increased to twice daily.  That was a recent change.  She is already approached the nurse practitioner about going back to the lower dosing.  Patient variably has had some complaint of pain.  When I evaluated the patient and talked her she had no pain complaints.  She reports she was ready to get back to her home.    Prior to Admission medications   Medication Sig Start Date End Date Taking? Authorizing Provider  allopurinol  (ZYLOPRIM ) 100 MG tablet Take 1 tablet by mouth daily. 03/05/23   [provider]  aspirin  EC 81 MG tablet Take 1 tablet (81 mg total) by mouth daily. Swallow whole. 04/04/23   Alexander, Natalie, DO  atorvastatin  (LIPITOR) 40 MG tablet Take 1 tablet (40 mg total) by mouth daily. 08/30/23   Lenon Marien CROME, MD  busPIRone  (BUSPAR ) 7.5 MG tablet Take 7.5 mg by mouth 2 (two) times daily. 07/10/23 07/09/24  [provider]  clopidogrel  (PLAVIX ) 75 MG tablet Take 1 tablet (75 mg total) by mouth daily. 08/30/23   Lenon Marien CROME, MD  colchicine  0.6 MG tablet Take 0.6 mg by mouth daily as needed. 08/12/23   [provider]  dapagliflozin  propanediol (FARXIGA ) 10 MG TABS tablet Take 1 tablet (10 mg total) by mouth daily. 07/20/23   Dorinda Drue DASEN, MD   furosemide  (LASIX ) 20 MG tablet Take 20 mg by mouth daily. 08/04/23   [provider]  loperamide  (LOPERAMIDE  A-D) 2 MG tablet Take 2 mg by mouth as needed for diarrhea or loose stools.    [provider]  losartan  (COZAAR ) 25 MG tablet Take 12.5 mg by mouth daily. 08/12/23   [provider]  memantine  (NAMENDA ) 10 MG tablet Take 2 tablets by mouth every evening.  10/26/15   [provider]  metoprolol  succinate (TOPROL -XL) 25 MG 24 hr tablet Take 0.5 tablets (12.5 mg total) by mouth at bedtime. 08/30/23 09/29/23  Lenon Marien CROME, MD  nystatin (MYCOSTATIN/NYSTOP) powder Apply 1 Application topically 3 (three) times daily. 08/04/23   [provider]  risperiDONE  (RISPERDAL ) 0.25 MG tablet Take 1 tablet (0.25 mg total) by mouth at bedtime. 08/30/23 09/29/23  Lenon Marien CROME, MD  risperiDONE  (RISPERDAL ) 0.5 MG tablet Take 0.5 mg by mouth 2 (two) times daily as needed. 08/15/23   [provider]  sodium chloride  (OCEAN) 0.65 % SOLN nasal spray Place 1 spray into both nostrils as needed for congestion. Patient not taking: Reported on 08/21/2023    [provider]  spironolactone  (ALDACTONE ) 25 MG tablet Take 12.5 mg by mouth daily. 08/12/23   [provider]    Allergies: Codeine, Etodolac, Indomethacin, Penicillins, Tizanidine, and Tramadol    Review of Systems  Updated Vital Signs BP 111/88 (  BP Location: Right Arm)   Pulse 69   Temp 97.6 F (36.4 C) (Oral)   Resp (!) 21   Ht 5' 5 (1.651 m)   Wt 67.1 kg   SpO2 100%   BMI 24.63 kg/m   Physical Exam Constitutional:      Comments: Patient is alert and nontoxic.  No respiratory distress.  HENT:     Head:     Comments: I have palpated his scalp and do not appreciate any hematoma.  No focal areas of tenderness.  No facial trauma.    Mouth/Throat:     Mouth: Mucous membranes are moist.     Pharynx: Oropharynx is clear.  Eyes:     Extraocular Movements: Extraocular  movements intact.     Pupils: Pupils are equal, round, and reactive to light.  Neck:     Comments: No midline C-spine tenderness. Cardiovascular:     Rate and Rhythm: Normal rate and regular rhythm.  Pulmonary:     Effort: Pulmonary effort is normal.     Breath sounds: Normal breath sounds.  Chest:     Chest wall: No tenderness.  Abdominal:     General: There is no distension.     Palpations: Abdomen is soft.     Tenderness: There is no abdominal tenderness.  Musculoskeletal:        General: No deformity or signs of injury. Normal range of motion.     Right lower leg: No edema.     Left lower leg: No edema.  Skin:    General: Skin is warm and dry.  Neurological:     Comments: Patient is alert.  She is answering questions.  Does have some element of dementia with some repetitive commentary but is calm and appropriately interactive.  Patient is directable and follows commands.  No focal motor deficits.  Psychiatric:        Mood and Affect: Mood normal.     (all labs ordered are listed, but only abnormal results are displayed) Labs Reviewed  BASIC METABOLIC PANEL WITH GFR - Abnormal; Notable for the following components:      Result Value   CO2 18 (*)    Glucose, Bld 170 (*)    Creatinine, Ser 1.06 (*)    GFR, Estimated 51 (*)    All other components within normal limits  CBC - Abnormal; Notable for the following components:   RDW 16.9 (*)    All other components within normal limits    EKG: EKG Interpretation Date/Time:  Friday December 08 2023 15:15:16 EDT Ventricular Rate:  68 PR Interval:  139 QRS Duration:  148 QT Interval:  447 QTC Calculation: 476 R Axis:   -68  Text Interpretation: Sinus rhythm RBBB and LAFB no sig change from previous Confirmed by Armenta Canning [phone]) on 12/08/2023 5:13:40 PM  Radiology: No results found.   Procedures   Medications Ordered in the ED  acetaminophen  (TYLENOL ) tablet 1,000 mg (1,000 mg Oral Given 12/08/23 1626)                                     Medical Decision Making Amount and/or Complexity of Data Reviewed Labs: ordered.  Risk OTC drugs.   Patient presents as outlined.  She had a fall yesterday evening reported to the patient's daughter today.  Patient's daughter had concern for a possible hematoma to the back of the patient's head.  At this time I do not identify any head or facial trauma.  Patient is alert without any signs of somnolence or confusion that appears different from baseline dementia.  At this time with stable mental status, no anticoagulants in no objective findings I do not think patient needs CT imaging.  Patient's daughter had concern for the care that she is getting at [facility].  If she is working on changing facilities.  Patient daughter has felt that she is at less energy level and activity than baseline.  Will check basic metabolic panel and CBC to rule out electrolyte derangement or anemia  GFR 51 which is near baseline for the patient.  Glucose 170 potassium 4.0 CBC normal.  Upon recheck patient remains alert and in no acute distress.  Clinically well in appearance.  Will plan for return to nursing care.  Recommend encouraging hydration, healthy diet and activity as tolerated.     Final diagnoses:  Fall, initial encounter  Injury of head, initial encounter    ED Discharge Orders     None          Armenta Canning, MD 12/08/23 1728

## 2023-12-08 NOTE — Discharge Instructions (Addendum)
 1.  Try to stay well-hydrated. 2.  You report having discussed the recent medication change with your family practitioner.  Continue to work with them for medication adjustments as needed.

## 2023-12-22 NOTE — Progress Notes (Signed)
  [facility] - Unable To Enroll Patient  A [facility] has successfully reached patient's daughter.   Najah Sherlin, was identified by admission/discharge/transfer alert as a candidate for [facility] care management services.   At this time, Maddelynn Rolf lives in a nursing facility.  Please consider discussing the benefits of [facility] with Destin Matsuo and referring again at a later date.  [facility]    [address]; [place], [place] [address] l  [email or web address] l [phone]   For more information on [facility] services, click here.

## 2023-12-23 ENCOUNTER — Emergency Department (HOSPITAL_COMMUNITY)
Admission: EM | Admit: 2023-12-23 | Discharge: 2023-12-24 | Disposition: A | Discharge status: Home / Self Care | Source: Skilled Nursing Facility | Attending: Emergency Medicine | Admitting: Emergency Medicine

## 2023-12-23 ENCOUNTER — Other Ambulatory Visit: Payer: Self-pay

## 2023-12-23 ENCOUNTER — Emergency Department (HOSPITAL_COMMUNITY)

## 2023-12-23 DIAGNOSIS — Z7982 Long term (current) use of aspirin: Secondary | ICD-10-CM | POA: Insufficient documentation

## 2023-12-23 DIAGNOSIS — R4182 Altered mental status, unspecified: Secondary | ICD-10-CM | POA: Diagnosis present

## 2023-12-23 DIAGNOSIS — F0390 Unspecified dementia without behavioral disturbance: Secondary | ICD-10-CM | POA: Diagnosis not present

## 2023-12-23 DIAGNOSIS — Z7902 Long term (current) use of antithrombotics/antiplatelets: Secondary | ICD-10-CM | POA: Insufficient documentation

## 2023-12-23 DIAGNOSIS — R7989 Other specified abnormal findings of blood chemistry: Secondary | ICD-10-CM | POA: Insufficient documentation

## 2023-12-23 DIAGNOSIS — R531 Weakness: Secondary | ICD-10-CM

## 2023-12-23 LAB — CBC WITH DIFFERENTIAL/PLATELET
Abs Immature Granulocytes: 0.04 K/uL (ref 0.00–0.07)
Basophils Absolute: 0.1 K/uL (ref 0.0–0.1)
Basophils Relative: 1 %
Eosinophils Absolute: 0.2 K/uL (ref 0.0–0.5)
Eosinophils Relative: 4 %
HCT: 45.1 % (ref 36.0–46.0)
Hemoglobin: 14.3 g/dL (ref 12.0–15.0)
Immature Granulocytes: 1 %
Lymphocytes Relative: 23 %
Lymphs Abs: 1.3 K/uL (ref 0.7–4.0)
MCH: 29.4 pg (ref 26.0–34.0)
MCHC: 31.7 g/dL (ref 30.0–36.0)
MCV: 92.6 fL (ref 80.0–100.0)
Monocytes Absolute: 0.4 K/uL (ref 0.1–1.0)
Monocytes Relative: 8 %
Neutro Abs: 3.5 K/uL (ref 1.7–7.7)
Neutrophils Relative %: 63 %
Platelets: 250 K/uL (ref 150–400)
RBC: 4.87 MIL/uL (ref 3.87–5.11)
RDW: 16 % — ABNORMAL HIGH (ref 11.5–15.5)
WBC: 5.6 K/uL (ref 4.0–10.5)
nRBC: 0 % (ref 0.0–0.2)

## 2023-12-23 LAB — LIPASE, BLOOD: Lipase: 29 U/L (ref 11–51)

## 2023-12-23 LAB — URINALYSIS, ROUTINE W REFLEX MICROSCOPIC
Glucose, UA: NEGATIVE mg/dL
Hgb urine dipstick: NEGATIVE
Ketones, ur: 5 mg/dL — AB
Leukocytes,Ua: NEGATIVE
Nitrite: NEGATIVE
Protein, ur: >=300 mg/dL — AB
Specific Gravity, Urine: 1.038 — ABNORMAL HIGH (ref 1.005–1.030)
pH: 5 (ref 5.0–8.0)

## 2023-12-23 LAB — COMPREHENSIVE METABOLIC PANEL WITH GFR
ALT: 15 U/L (ref 0–44)
AST: 25 U/L (ref 15–41)
Albumin: 3.1 g/dL — ABNORMAL LOW (ref 3.5–5.0)
Alkaline Phosphatase: 105 U/L (ref 38–126)
Anion gap: 11 (ref 5–15)
BUN: 22 mg/dL (ref 8–23)
CO2: 21 mmol/L — ABNORMAL LOW (ref 22–32)
Calcium: 9 mg/dL (ref 8.9–10.3)
Chloride: 105 mmol/L (ref 98–111)
Creatinine, Ser: 1.15 mg/dL — ABNORMAL HIGH (ref 0.44–1.00)
GFR, Estimated: 46 mL/min — ABNORMAL LOW (ref >60)
Glucose, Bld: 147 mg/dL — ABNORMAL HIGH (ref 70–99)
Potassium: 4.3 mmol/L (ref 3.5–5.1)
Sodium: 137 mmol/L (ref 135–145)
Total Bilirubin: 1.9 mg/dL — ABNORMAL HIGH (ref 0.0–1.2)
Total Protein: 6.8 g/dL (ref 6.5–8.1)

## 2023-12-23 LAB — TROPONIN I (HIGH SENSITIVITY)
Troponin I (High Sensitivity): 322 ng/L (ref <18)
Troponin I (High Sensitivity): 296 ng/L (ref <18)

## 2023-12-23 LAB — RESP PANEL BY RT-PCR (RSV, FLU A&B, COVID)  RVPGX2
Influenza A by PCR: NEGATIVE
Influenza B by PCR: NEGATIVE
Resp Syncytial Virus by PCR: NEGATIVE
SARS Coronavirus 2 by RT PCR: NEGATIVE

## 2023-12-23 LAB — AMMONIA: Ammonia: 16 umol/L (ref 9–35)

## 2023-12-23 NOTE — ED Notes (Signed)
 Attempted peripheral IV x2 by RN

## 2023-12-23 NOTE — ED Notes (Signed)
 Patient transported to CT

## 2023-12-23 NOTE — ED Triage Notes (Signed)
 Patient arrives via [facility] from [facility] after not eating breakfast this am. Patients family came for visit, noted she didn't eat breakfast and wanted her to be evaluated. Patient responds to verbal commands but per facility came become physically or verbally aggressive. Hx of dementia.   EMS vitals 129/95 HR 84 O2 97 on room air CBG 168

## 2023-12-23 NOTE — ED Notes (Signed)
 [facility] called unable to give pick up time

## 2023-12-23 NOTE — ED Provider Notes (Signed)
 Pt signed out that if/when delta trop flat and ct head neg acute that plan is for d/c back to facility.  Trop is flat. No apparent chest pain or discomfort. No sob or increased wob.   Ct neg acute.   Pt currently appears stable for d/c per Dr Axel plan.     Bernard Drivers, MD 12/23/23 [phone]

## 2023-12-23 NOTE — Discharge Instructions (Addendum)
 Please follow-up with a facility provider if you have any further concerns.  The ED workup is reassuring today.  Specifically there was no sign of a new stroke, urinary infection, or other medical emergency.  There were no clinical signs of dehydration on the blood testing.  I updated the patient's daughter Sharman at the bedside about the workup.

## 2023-12-23 NOTE — ED Provider Notes (Signed)
 [place] EMERGENCY DEPARTMENT AT [facility] Provider Note   CSN: [id number] Arrival date & time: 12/23/23  1219     Patient presents with: Fatigue   [name] [name] is a 88 y.o. female with history of dementia presenting from [facility] with concern for poor appetite and failure to thrive.  The patient's daughter went to visit her today and was concerned the patient was not eating breakfast.  Reports the patient has been eating very little the past few days.  She wanted her brought in for evaluation.  Patient is a level 5 caveat for advanced dementia and cannot provide any further history.  Update, the patient's daughter at bedside reports that the patient has had waxing and waning cognition with her dementia for several weeks or months.  The patient's daughter was concerned the patient not been eating much this past week and thought she might be dehydrated.  She says the patient can typically recognize her, but often gets confused about other details.  She has been seen several times in the ED in the past few months for a variety of concerns, including most recently for a fall at the end of August, and then for some confusion and potential UTI in the middle of August, last month.  Her most recent urine culture from August 17 was insignificant growth.  Likewise for her May urine culture.  In March 2025 urine culture she had 40,000 colony units of E. coli   HPI     Prior to Admission medications   Medication Sig Start Date End Date Taking? Authorizing Provider  allopurinol  (ZYLOPRIM ) 100 MG tablet Take 1 tablet by mouth daily. 03/05/23   [provider]  aspirin  EC 81 MG tablet Take 1 tablet (81 mg total) by mouth daily. Swallow whole. 04/04/23   Alexander, Natalie, DO  atorvastatin  (LIPITOR) 40 MG tablet Take 1 tablet (40 mg total) by mouth daily. 08/30/23   Lenon Marien CROME, MD  busPIRone  (BUSPAR ) 7.5 MG tablet Take 7.5 mg by mouth 2 (two) times  daily. 07/10/23 07/09/24  [provider]  clopidogrel  (PLAVIX ) 75 MG tablet Take 1 tablet (75 mg total) by mouth daily. 08/30/23   Lenon Marien CROME, MD  colchicine  0.6 MG tablet Take 0.6 mg by mouth daily as needed. 08/12/23   [provider]  dapagliflozin  propanediol (FARXIGA ) 10 MG TABS tablet Take 1 tablet (10 mg total) by mouth daily. 07/20/23   Dorinda Drue DASEN, MD  furosemide  (LASIX ) 20 MG tablet Take 20 mg by mouth daily. 08/04/23   [provider]  loperamide  (LOPERAMIDE  A-D) 2 MG tablet Take 2 mg by mouth as needed for diarrhea or loose stools.    [provider]  losartan  (COZAAR ) 25 MG tablet Take 12.5 mg by mouth daily. 08/12/23   [provider]  memantine  (NAMENDA ) 10 MG tablet Take 2 tablets by mouth every evening.  10/26/15   [provider]  metoprolol  succinate (TOPROL -XL) 25 MG 24 hr tablet Take 0.5 tablets (12.5 mg total) by mouth at bedtime. 08/30/23 09/29/23  Lenon Marien CROME, MD  nystatin (MYCOSTATIN/NYSTOP) powder Apply 1 Application topically 3 (three) times daily. 08/04/23   [provider]  risperiDONE  (RISPERDAL ) 0.25 MG tablet Take 1 tablet (0.25 mg total) by mouth at bedtime. 08/30/23 09/29/23  Lenon Marien CROME, MD  risperiDONE  (RISPERDAL ) 0.5 MG tablet Take 0.5 mg by mouth 2 (two) times daily as needed. 08/15/23   [provider]  sodium chloride  (OCEAN) 0.65 %  SOLN nasal spray Place 1 spray into both nostrils as needed for congestion. Patient not taking: Reported on 08/21/2023    [provider]  spironolactone  (ALDACTONE ) 25 MG tablet Take 12.5 mg by mouth daily. 08/12/23   [provider]    Allergies: Codeine, Etodolac, Indomethacin, Penicillins, Tizanidine, and Tramadol    Review of Systems  Updated Vital Signs BP (!) 127/96   Pulse 76   Temp (!) 97 F (36.1 C) (Axillary)   Resp 12   Ht 5' 5 (1.651 m)   Wt 67.1 kg   SpO2 100%   BMI 24.63 kg/m   Physical  Exam Constitutional:      General: She is not in acute distress. HENT:     Head: Normocephalic and atraumatic.  Eyes:     Conjunctiva/sclera: Conjunctivae normal.     Pupils: Pupils are equal, round, and reactive to light.  Cardiovascular:     Rate and Rhythm: Normal rate and regular rhythm.  Pulmonary:     Effort: Pulmonary effort is normal. No respiratory distress.  Abdominal:     General: There is no distension.     Tenderness: There is no abdominal tenderness.  Skin:    General: Skin is warm and dry.  Neurological:     General: No focal deficit present.     Mental Status: She is alert. Mental status is at baseline.     (all labs ordered are listed, but only abnormal results are displayed) Labs Reviewed  COMPREHENSIVE METABOLIC PANEL WITH GFR - Abnormal; Notable for the following components:      Result Value   CO2 21 (*)    Glucose, Bld 147 (*)    Creatinine, Ser 1.15 (*)    Albumin 3.1 (*)    Total Bilirubin 1.9 (*)    GFR, Estimated 46 (*)    All other components within normal limits  CBC WITH DIFFERENTIAL/PLATELET - Abnormal; Notable for the following components:   RDW 16.0 (*)    All other components within normal limits  URINALYSIS, ROUTINE W REFLEX MICROSCOPIC - Abnormal; Notable for the following components:   Color, Urine AMBER (*)    APPearance HAZY (*)    Specific Gravity, Urine 1.038 (*)    Bilirubin Urine SMALL (*)    Ketones, ur 5 (*)    Protein, ur >=300 (*)    Bacteria, UA RARE (*)    All other components within normal limits  TROPONIN I (HIGH SENSITIVITY) - Abnormal; Notable for the following components:   Troponin I (High Sensitivity) 322 (*)    All other components within normal limits  RESP PANEL BY RT-PCR (RSV, FLU A&B, COVID)  RVPGX2  LIPASE, BLOOD  AMMONIA  TROPONIN I (HIGH SENSITIVITY)    EKG: EKG Interpretation Date/Time:  Saturday December 23 2023 12:37:53 EDT Ventricular Rate:  70 PR Interval:  158 QRS Duration:  136 QT  Interval:  445 QTC Calculation: 481 R Axis:   -66  Text Interpretation: Sinus rhythm Ventricular premature complex Right bundle branch block Confirmed by Cottie Cough [phone]) on 12/23/2023 12:50:41 PM  Radiology: No results found.   Procedures   Medications Ordered in the ED - No data to display  Clinical Course as of 12/23/23 1508  Sat Dec 23, 2023  1416 Trop chronically elevated in this range [MT]  1420 No answer from daughter gayle's phone number [MT]  1451 UA without evidence of infection [MT]  1505 I spoke to the patient's daughter and updated her regarding  the patient's workup.  We are in agreement to her anticipated discharge back to her nursing facility if delta troponin is stable and there was no emergent finding on CT head. [MT]    Clinical Course User Index [MT] Keylin Podolsky, Donnice PARAS, MD                                 Medical Decision Making Amount and/or Complexity of Data Reviewed Labs: ordered. Radiology: ordered.   This patient presents to the ED with concern for reported worsening somnolence, altered mental status. This involves an extensive number of treatment options, and is a complaint that carries with it a high risk of complications and morbidity.  The differential diagnosis includes progressive dementia versus UTI versus anemia versus CVA versus other  Co-morbidities that complicate the patient evaluation: Advanced dementia  Additional history obtained from EMS, patient's daughter  External records from outside source obtained and reviewed including prior ED visits for dementia, recurring falls, confusion  I ordered and personally interpreted labs.  The pertinent results include: No emergent findings.  UA without clear evidence of infection.  No evidence of AKI, dehydration, electrolyte derangement, or infection.  Initial troponin is chronically elevated.  Will pend repeat troponin  I ordered imaging studies including CT head, pending  The patient was  maintained on a cardiac monitor.  I personally viewed and interpreted the cardiac monitored which showed an underlying rhythm of: Sinus rhythm  Per my interpretation the patient's ECG shows No acute ischemic findings  I have reviewed the patients home medicines and have made adjustments as needed  Test Considered: doubt meningitis, sepsis, PE  My discussion with the patient's daughter who is her medical power of attorney, we are in agreement to avoid unnecessary hospitalization.  I suspect this is progression of the patient's chronic dementia.  She can be discharged back to her facility if there are no further emergent findings.       Final diagnoses:  Dementia, unspecified dementia severity, unspecified dementia type, unspecified whether behavioral, psychotic, or mood disturbance or anxiety (HCC)  Elevated troponin    ED Discharge Orders     None          Cottie Donnice PARAS, MD 12/23/23 [phone]

## 2024-02-28 ENCOUNTER — Emergency Department (HOSPITAL_COMMUNITY)

## 2024-02-28 ENCOUNTER — Inpatient Hospital Stay (HOSPITAL_COMMUNITY)

## 2024-02-28 ENCOUNTER — Inpatient Hospital Stay (HOSPITAL_COMMUNITY)
Admission: EM | Admit: 2024-02-28 | Discharge: 2024-03-02 | DRG: 194 | Disposition: A | Discharge status: Skilled Nursing Facility | Source: Skilled Nursing Facility | Attending: Internal Medicine | Admitting: Internal Medicine

## 2024-02-28 ENCOUNTER — Other Ambulatory Visit: Payer: Self-pay

## 2024-02-28 DIAGNOSIS — Z87891 Personal history of nicotine dependence: Secondary | ICD-10-CM | POA: Diagnosis not present

## 2024-02-28 DIAGNOSIS — J189 Pneumonia, unspecified organism: Principal | ICD-10-CM | POA: Diagnosis present

## 2024-02-28 DIAGNOSIS — Z66 Do not resuscitate: Secondary | ICD-10-CM | POA: Diagnosis present

## 2024-02-28 DIAGNOSIS — Z7401 Bed confinement status: Secondary | ICD-10-CM

## 2024-02-28 DIAGNOSIS — I2510 Atherosclerotic heart disease of native coronary artery without angina pectoris: Secondary | ICD-10-CM | POA: Diagnosis present

## 2024-02-28 DIAGNOSIS — I5042 Chronic combined systolic (congestive) and diastolic (congestive) heart failure: Secondary | ICD-10-CM | POA: Diagnosis present

## 2024-02-28 DIAGNOSIS — N1832 Chronic kidney disease, stage 3b: Secondary | ICD-10-CM | POA: Diagnosis present

## 2024-02-28 DIAGNOSIS — Z7982 Long term (current) use of aspirin: Secondary | ICD-10-CM

## 2024-02-28 DIAGNOSIS — I272 Pulmonary hypertension, unspecified: Secondary | ICD-10-CM | POA: Diagnosis present

## 2024-02-28 DIAGNOSIS — R7989 Other specified abnormal findings of blood chemistry: Secondary | ICD-10-CM | POA: Diagnosis present

## 2024-02-28 DIAGNOSIS — Z853 Personal history of malignant neoplasm of breast: Secondary | ICD-10-CM

## 2024-02-28 DIAGNOSIS — Z79899 Other long term (current) drug therapy: Secondary | ICD-10-CM | POA: Diagnosis not present

## 2024-02-28 DIAGNOSIS — R0902 Hypoxemia: Secondary | ICD-10-CM | POA: Diagnosis present

## 2024-02-28 DIAGNOSIS — E1151 Type 2 diabetes mellitus with diabetic peripheral angiopathy without gangrene: Secondary | ICD-10-CM | POA: Diagnosis present

## 2024-02-28 DIAGNOSIS — N390 Urinary tract infection, site not specified: Secondary | ICD-10-CM | POA: Diagnosis present

## 2024-02-28 DIAGNOSIS — E1122 Type 2 diabetes mellitus with diabetic chronic kidney disease: Secondary | ICD-10-CM | POA: Diagnosis present

## 2024-02-28 DIAGNOSIS — Z7902 Long term (current) use of antithrombotics/antiplatelets: Secondary | ICD-10-CM | POA: Diagnosis not present

## 2024-02-28 DIAGNOSIS — I130 Hypertensive heart and chronic kidney disease with heart failure and stage 1 through stage 4 chronic kidney disease, or unspecified chronic kidney disease: Secondary | ICD-10-CM | POA: Diagnosis present

## 2024-02-28 DIAGNOSIS — Z8673 Personal history of transient ischemic attack (TIA), and cerebral infarction without residual deficits: Secondary | ICD-10-CM

## 2024-02-28 DIAGNOSIS — I5043 Acute on chronic combined systolic (congestive) and diastolic (congestive) heart failure: Secondary | ICD-10-CM | POA: Diagnosis not present

## 2024-02-28 DIAGNOSIS — J9621 Acute and chronic respiratory failure with hypoxia: Secondary | ICD-10-CM

## 2024-02-28 DIAGNOSIS — I5022 Chronic systolic (congestive) heart failure: Secondary | ICD-10-CM | POA: Diagnosis present

## 2024-02-28 DIAGNOSIS — H518 Other specified disorders of binocular movement: Secondary | ICD-10-CM | POA: Diagnosis present

## 2024-02-28 DIAGNOSIS — F0390 Unspecified dementia without behavioral disturbance: Secondary | ICD-10-CM | POA: Diagnosis present

## 2024-02-28 LAB — I-STAT ARTERIAL BLOOD GAS, ED
Acid-base deficit: 3 mmol/L — ABNORMAL HIGH (ref 0.0–2.0)
Bicarbonate: 17.1 mmol/L — ABNORMAL LOW (ref 20.0–28.0)
Calcium, Ion: 1.21 mmol/L (ref 1.15–1.40)
HCT: 47 % — ABNORMAL HIGH (ref 36.0–46.0)
Hemoglobin: 16 g/dL — ABNORMAL HIGH (ref 12.0–15.0)
O2 Saturation: 100 %
Patient temperature: 96.5
Potassium: 3.5 mmol/L (ref 3.5–5.1)
Sodium: 144 mmol/L (ref 135–145)
TCO2: 18 mmol/L — ABNORMAL LOW (ref 22–32)
pCO2 arterial: 20.4 mmHg — ABNORMAL LOW (ref 32–48)
pH, Arterial: 7.526 — ABNORMAL HIGH (ref 7.35–7.45)
pO2, Arterial: 177 mmHg — ABNORMAL HIGH (ref 83–108)

## 2024-02-28 LAB — URINALYSIS, W/ REFLEX TO CULTURE (INFECTION SUSPECTED)
Bilirubin Urine: NEGATIVE
Glucose, UA: >=500 mg/dL — AB
Ketones, ur: NEGATIVE mg/dL
Nitrite: POSITIVE — AB
Protein, ur: 100 mg/dL — AB
Specific Gravity, Urine: 1.035 — ABNORMAL HIGH (ref 1.005–1.030)
WBC, UA: >50 WBC/hpf (ref 0–5)
pH: 5 (ref 5.0–8.0)

## 2024-02-28 LAB — CBC
HCT: 44.6 % (ref 36.0–46.0)
Hemoglobin: 14.7 g/dL (ref 12.0–15.0)
MCH: 29.3 pg (ref 26.0–34.0)
MCHC: 33 g/dL (ref 30.0–36.0)
MCV: 88.8 fL (ref 80.0–100.0)
Platelets: 185 K/uL (ref 150–400)
RBC: 5.02 MIL/uL (ref 3.87–5.11)
RDW: 21.3 % — ABNORMAL HIGH (ref 11.5–15.5)
WBC: 4.6 K/uL (ref 4.0–10.5)
nRBC: 0.4 % — ABNORMAL HIGH (ref 0.0–0.2)

## 2024-02-28 LAB — ECHOCARDIOGRAM COMPLETE
Area-P 1/2: 3.36 cm2
Calc EF: 28.4 %
Est EF: <20
Height: 65 in
S' Lateral: 3.9 cm
Single Plane A2C EF: 5.4 %
Single Plane A4C EF: 47.8 %
Weight: 2363.33 [oz_av]

## 2024-02-28 LAB — COMPREHENSIVE METABOLIC PANEL WITH GFR
ALT: 24 U/L (ref 0–44)
AST: 37 U/L (ref 15–41)
Albumin: 2.6 g/dL — ABNORMAL LOW (ref 3.5–5.0)
Alkaline Phosphatase: 116 U/L (ref 38–126)
Anion gap: 14 (ref 5–15)
BUN: 17 mg/dL (ref 8–23)
CO2: 20 mmol/L — ABNORMAL LOW (ref 22–32)
Calcium: 8.8 mg/dL — ABNORMAL LOW (ref 8.9–10.3)
Chloride: 110 mmol/L (ref 98–111)
Creatinine, Ser: 1 mg/dL (ref 0.44–1.00)
GFR, Estimated: 55 mL/min — ABNORMAL LOW (ref >60)
Glucose, Bld: 124 mg/dL — ABNORMAL HIGH (ref 70–99)
Potassium: 3.7 mmol/L (ref 3.5–5.1)
Sodium: 144 mmol/L (ref 135–145)
Total Bilirubin: 1.7 mg/dL — ABNORMAL HIGH (ref 0.0–1.2)
Total Protein: 6.6 g/dL (ref 6.5–8.1)

## 2024-02-28 LAB — DIFFERENTIAL
Abs Immature Granulocytes: 0.01 K/uL (ref 0.00–0.07)
Basophils Absolute: 0 K/uL (ref 0.0–0.1)
Basophils Relative: 1 %
Eosinophils Absolute: 0.2 K/uL (ref 0.0–0.5)
Eosinophils Relative: 4 %
Immature Granulocytes: 0 %
Lymphocytes Relative: 22 %
Lymphs Abs: 1 K/uL (ref 0.7–4.0)
Monocytes Absolute: 0.2 K/uL (ref 0.1–1.0)
Monocytes Relative: 5 %
Neutro Abs: 3.1 K/uL (ref 1.7–7.7)
Neutrophils Relative %: 68 %
Smear Review: NORMAL

## 2024-02-28 LAB — ETHANOL: Alcohol, Ethyl (B): <15 mg/dL (ref <15)

## 2024-02-28 LAB — I-STAT CHEM 8, ED
BUN: 23 mg/dL (ref 8–23)
Calcium, Ion: 0.97 mmol/L — ABNORMAL LOW (ref 1.15–1.40)
Chloride: 109 mmol/L (ref 98–111)
Creatinine, Ser: 0.9 mg/dL (ref 0.44–1.00)
Glucose, Bld: 130 mg/dL — ABNORMAL HIGH (ref 70–99)
HCT: 53 % — ABNORMAL HIGH (ref 36.0–46.0)
Hemoglobin: 18 g/dL — ABNORMAL HIGH (ref 12.0–15.0)
Potassium: 4.2 mmol/L (ref 3.5–5.1)
Sodium: 143 mmol/L (ref 135–145)
TCO2: 21 mmol/L — ABNORMAL LOW (ref 22–32)

## 2024-02-28 LAB — MAGNESIUM: Magnesium: 1.8 mg/dL (ref 1.7–2.4)

## 2024-02-28 LAB — BRAIN NATRIURETIC PEPTIDE: B Natriuretic Peptide: 2195 pg/mL — ABNORMAL HIGH (ref 0.0–100.0)

## 2024-02-28 LAB — TROPONIN I (HIGH SENSITIVITY)
Troponin I (High Sensitivity): 310 ng/L (ref <18)
Troponin I (High Sensitivity): 285 ng/L (ref <18)

## 2024-02-28 LAB — CBG MONITORING, ED: Glucose-Capillary: 83 mg/dL (ref 70–99)

## 2024-02-28 LAB — PROTIME-INR
INR: 1.3 — ABNORMAL HIGH (ref 0.8–1.2)
Prothrombin Time: 16.5 s — ABNORMAL HIGH (ref 11.4–15.2)

## 2024-02-28 LAB — APTT: aPTT: 35 s (ref 24–36)

## 2024-02-28 MED ORDER — SODIUM CHLORIDE 0.9 % IV BOLUS
250.0000 mL | INTRAVENOUS | Status: AC
  Administered 2024-02-28: 250 mL via INTRAVENOUS

## 2024-02-28 MED ORDER — ACETAMINOPHEN 500 MG PO TABS
500.0000 mg | ORAL_TABLET | Freq: Four times a day (QID) | ORAL | Status: DC | PRN
  Administered 2024-03-02: 500 mg via ORAL
  Filled 2024-02-28: qty 1

## 2024-02-28 MED ORDER — BUSPIRONE HCL 5 MG PO TABS
7.5000 mg | ORAL_TABLET | Freq: Two times a day (BID) | ORAL | Status: DC
  Administered 2024-02-28 – 2024-03-02 (×6): 7.5 mg via ORAL
  Filled 2024-02-28 (×3): qty 2
  Filled 2024-02-28 (×2): qty 1
  Filled 2024-02-28 (×3): qty 2

## 2024-02-28 MED ORDER — LORAZEPAM 0.5 MG PO TABS: 0.5000 mg | ORAL_TABLET | Freq: Two times a day (BID) | ORAL | Status: DC | PRN

## 2024-02-28 MED ORDER — POLYETHYLENE GLYCOL 3350 17 G PO PACK
17.0000 g | PACK | Freq: Every day | ORAL | Status: DC | PRN
  Administered 2024-03-02: 17 g via ORAL
  Filled 2024-02-28: qty 1

## 2024-02-28 MED ORDER — ASPIRIN 81 MG PO CHEW
81.0000 mg | CHEWABLE_TABLET | Freq: Every day | ORAL | Status: DC
  Administered 2024-02-28 – 2024-03-02 (×4): 81 mg via ORAL
  Filled 2024-02-28 (×4): qty 1

## 2024-02-28 MED ORDER — SODIUM CHLORIDE 0.9 % IV SOLN
2.0000 g | INTRAVENOUS | Status: DC
  Administered 2024-02-28 – 2024-03-01 (×3): 2 g via INTRAVENOUS
  Filled 2024-02-28 (×3): qty 20

## 2024-02-28 MED ORDER — ATORVASTATIN CALCIUM 40 MG PO TABS
40.0000 mg | ORAL_TABLET | Freq: Every day | ORAL | Status: DC
  Administered 2024-02-28 – 2024-03-02 (×4): 40 mg via ORAL
  Filled 2024-02-28 (×4): qty 1

## 2024-02-28 MED ORDER — MEMANTINE HCL 10 MG PO TABS
20.0000 mg | ORAL_TABLET | Freq: Two times a day (BID) | ORAL | Status: DC
  Administered 2024-02-28 – 2024-03-02 (×6): 20 mg via ORAL
  Filled 2024-02-28 (×6): qty 2

## 2024-02-28 MED ORDER — PROCHLORPERAZINE EDISYLATE 10 MG/2ML IJ SOLN: 5.0000 mg | Freq: Four times a day (QID) | INTRAMUSCULAR | Status: DC | PRN

## 2024-02-28 MED ORDER — SODIUM CHLORIDE 0.9 % IV SOLN
500.0000 mg | INTRAVENOUS | Status: DC
  Administered 2024-02-28 – 2024-02-29 (×2): 500 mg via INTRAVENOUS
  Filled 2024-02-28 (×2): qty 5

## 2024-02-28 MED ORDER — GUAIFENESIN-DM 100-10 MG/5ML PO SYRP: 5.0000 mL | ORAL_SOLUTION | ORAL | Status: DC | PRN

## 2024-02-28 MED ORDER — SODIUM CHLORIDE 0.9% FLUSH
3.0000 mL | Freq: Once | INTRAVENOUS | Status: AC
  Administered 2024-02-28: 3 mL via INTRAVENOUS

## 2024-02-28 MED ORDER — DAPAGLIFLOZIN PROPANEDIOL 10 MG PO TABS
10.0000 mg | ORAL_TABLET | Freq: Every day | ORAL | Status: DC
  Administered 2024-02-29: 10 mg via ORAL
  Filled 2024-02-28 (×2): qty 1

## 2024-02-28 MED ORDER — BREXPIPRAZOLE 2 MG PO TABS
2.0000 mg | ORAL_TABLET | Freq: Every day | ORAL | Status: DC
  Administered 2024-02-29 – 2024-03-02 (×3): 2 mg via ORAL
  Filled 2024-02-28 (×4): qty 1

## 2024-02-28 MED ORDER — SODIUM CHLORIDE 0.9 % IV SOLN
500.0000 mg | Freq: Once | INTRAVENOUS | Status: AC
  Administered 2024-02-28: 500 mg via INTRAVENOUS
  Filled 2024-02-28: qty 5

## 2024-02-28 MED ORDER — SODIUM CHLORIDE 0.9 % IV SOLN
2.0000 g | Freq: Once | INTRAVENOUS | Status: AC
  Administered 2024-02-28: 2 g via INTRAVENOUS
  Filled 2024-02-28: qty 20

## 2024-02-28 MED ORDER — MELATONIN 5 MG PO TABS: 5.0000 mg | ORAL_TABLET | Freq: Every evening | ORAL | Status: DC | PRN

## 2024-02-28 MED ORDER — PERFLUTREN LIPID MICROSPHERE
1.0000 mL | INTRAVENOUS | Status: AC | PRN
  Administered 2024-02-28: 2 mL via INTRAVENOUS

## 2024-02-28 MED ORDER — ENOXAPARIN SODIUM 40 MG/0.4ML IJ SOSY
40.0000 mg | PREFILLED_SYRINGE | Freq: Every day | INTRAMUSCULAR | Status: DC
  Administered 2024-02-28 – 2024-02-29 (×2): 40 mg via SUBCUTANEOUS
  Filled 2024-02-28 (×2): qty 0.4

## 2024-02-28 MED ORDER — IPRATROPIUM-ALBUTEROL 0.5-2.5 (3) MG/3ML IN SOLN: 3.0000 mL | RESPIRATORY_TRACT | Status: DC | PRN

## 2024-02-28 NOTE — ED Notes (Signed)
Labs recollected

## 2024-02-28 NOTE — ED Notes (Signed)
 Repeat labs hemolyzed, phlebotomy asked to attempt.

## 2024-02-28 NOTE — Plan of Care (Signed)

## 2024-02-28 NOTE — H&P (Addendum)
 History and Physical  [name] [name] FMW:[id number] DOB: [birth year] DOA: 02/28/2024  Referring physician: Dr. Raford, EDP  [facility]P: [facility]  Outpatient Specialists: Cardiology. Patient coming from: SNF.  Chief Complaint: Low oxygen level  HPI: [name] [name] is a 88 y.o. female with medical history significant for chronic combined diastolic and systolic CHF, moderate to severe pulmonary hypertension, essential hypertension, type 2 diabetes, peripheral artery disease, coronary artery disease, chronic hypoxia on 2 L nasal cannula continuously, CKD 3B, dementia, who presents to the ER from SNF initially as a code stroke.  Reportedly the patient was hypoxic and had left-sided deviation.  Upon arrival to the ER, the patient was evaluated by stroke team and was found to have no focal deficits.  Code stroke was canceled by neurologist.  Further workup revealed chest x-ray showing left lower lobe opacities concerning for pneumonia.  Also revealed elevated greater than 2100 and troponin that peaked at 310, then down trended to 285.  Not volume overload on exam.  Denies chest pain.  The patient was started on IV antibiotics, Rocephin , IV azithromycin, due to concern for community-acquired pneumonia.  Admitted by [facility], hospitalist service.  At the time of the visit, the patient is alert, confused in the setting of dementia.  She has no complaints.  ED Course: Temperature 97.8.  BP 93/68, pulse 75, respiration rate 11, O2 saturation 99% on 4 L.  Review of Systems: Review of systems as noted in the HPI. All other systems reviewed and are negative.   Past Medical History:  Diagnosis Date   Cancer [facility])    right breast cancer   Diabetes mellitus without complication (HCC)    Hypertension    Stroke [facility])    Past Surgical History:  Procedure Laterality Date   BREAST SURGERY     right lumpectomy   KNEE SURGERY     bilateral    Social History:  reports that she quit smoking about 35  years ago. Her smoking use included cigarettes. She has never used smokeless tobacco. She reports that she does not drink alcohol and does not use drugs.   Allergies  Allergen Reactions   Codeine Other (See Comments)    I go crazy   Etodolac     Other reaction(s): Unknown   Indomethacin     Other reaction(s): Unknown   Penicillins Other (See Comments)    Does not remember this allergy Has patient had a PCN reaction causing immediate rash, facial/tongue/throat swelling, SOB or lightheadedness with hypotension: Unknown Has patient had a PCN reaction causing severe rash involving mucus membranes or skin necrosis: Unknown Has patient had a PCN reaction that required hospitalization: Unknown Has patient had a PCN reaction occurring within the last 10 years: Unknown If all of the above answers are NO, then may proceed with Cephalosporin use.    Tizanidine     Other reaction(s): Syncope Fell on the floor    Tramadol     Other reaction(s): Unknown   Family history: None reported.  Prior to Admission medications   Medication Sig Start Date End Date Taking? Authorizing Provider  acetaminophen  (TYLENOL ) 500 MG tablet Take 1,000 mg by mouth every 8 (eight) hours as needed for mild pain (pain score 1-3).   Yes [provider]  allopurinol  (ZYLOPRIM ) 100 MG tablet Take 1 tablet by mouth daily. 03/05/23  Yes [provider]  aspirin  81 MG chewable tablet Chew 81 mg by mouth daily.   Yes [provider]  atorvastatin  (LIPITOR) 40  MG tablet Take 1 tablet (40 mg total) by mouth daily. Patient taking differently: Take 40 mg by mouth every evening. 08/30/23  Yes Lenon Marien CROME, MD  busPIRone  (BUSPAR ) 7.5 MG tablet Take 7.5 mg by mouth 2 (two) times daily. 07/10/23 07/09/24 Yes [provider]  colchicine  0.6 MG tablet Take 0.6 mg by mouth daily as needed. 08/12/23  Yes [provider]  dapagliflozin  propanediol (FARXIGA ) 10 MG TABS tablet Take 1  tablet (10 mg total) by mouth daily. 07/20/23  Yes Dorinda Drue DASEN, MD  LORazepam (ATIVAN) 0.5 MG tablet Take 0.5 mg by mouth every 12 (twelve) hours as needed for anxiety. 02/21/24 03/06/24 Yes [provider]  memantine  (NAMENDA ) 10 MG tablet Take 2 tablets by mouth 2 (two) times daily. 10/26/15  Yes [provider]  nystatin (MYCOSTATIN/NYSTOP) powder Apply 1 Application topically 3 (three) times daily. 02/21/24 03/06/24 Yes [provider]  OXYGEN Inhale 2 L into the lungs continuous.   Yes [provider]  REXULTI 2 MG TABS tablet Take 2 mg by mouth daily. 02/21/24  Yes [provider]  sodium chloride  (OCEAN) 0.65 % SOLN nasal spray Place 1 spray into both nostrils as needed for congestion. 02/21/24  Yes [provider]    Physical Exam: BP 93/68 (BP Location: Right Arm)   Pulse 75   Temp 97.8 F (36.6 C) (Axillary)   Resp 11   Ht 5' 5 (1.651 m)   Wt 67 kg   SpO2 99%   BMI 24.58 kg/m   General: 88 y.o. year-old female well developed well nourished in no acute distress.  Alert and confused. Cardiovascular: Regular rate and rhythm with no rubs or gallops.  No thyromegaly or JVD noted.  No lower extremity edema. 2/4 pulses in all 4 extremities. Respiratory: Fine rales at bases.  Poor inspiratory effort. Abdomen: Soft nontender nondistended with normal bowel sounds x4 quadrants. Muskuloskeletal: No cyanosis, clubbing or edema noted bilaterally Neuro: CN II-XII intact, strength, sensation, reflexes Skin: No ulcerative lesions noted or rashes Psychiatry: Judgement and insight appear altered. Mood is appropriate for condition and setting          Labs on Admission:  Basic Metabolic Panel: Recent Labs  Lab 02/28/24 0215 02/28/24 0217 02/28/24 0219  NA 143  --  144  K 4.2  --  3.5  CL 109  --   --   GLUCOSE 130*  --   --   BUN 23  --   --   CREATININE 0.90  --   --   MG  --  1.8  --    Liver Function Tests: No results  for input(s): AST, ALT, ALKPHOS, BILITOT, PROT, ALBUMIN in the last 168 hours. No results for input(s): LIPASE, AMYLASE in the last 168 hours. No results for input(s): AMMONIA in the last 168 hours. CBC: Recent Labs  Lab 02/28/24 0158 02/28/24 0215 02/28/24 0219  WBC 4.6  --   --   NEUTROABS 3.1  --   --   HGB 14.7 18.0* 16.0*  HCT 44.6 53.0* 47.0*  MCV 88.8  --   --   PLT 185  --   --    Cardiac Enzymes: No results for input(s): CKTOTAL, CKMB, CKMBINDEX, TROPONINI in the last 168 hours.  BNP (last 3 results) Recent Labs    08/21/23 1053 11/26/23 1252 02/28/24 0200  BNP 1,549.7* 1,489.0* 2,195.0*    ProBNP (last 3 results) No results for input(s): PROBNP in the last  8760 hours.  CBG: Recent Labs  Lab 02/28/24 0159  GLUCAP 83    Radiological Exams on Admission: DG Chest Port 1 View Result Date: 02/28/2024 EXAM: 1 VIEW(S) XRAY OF THE CHEST 02/28/2024 03:12:00 AM COMPARISON: None available. CLINICAL HISTORY: hypoxia hypoxia FINDINGS: LUNGS AND PLEURA: Left lower lung opacities. Chronic coarsened interstitial markings. No pleural effusion. No pneumothorax. HEART AND MEDIASTINUM: Similar enlarged cardiomediastinal silhouette.  Aortic atherosclerosis. BONES AND SOFT TISSUES: No acute osseous abnormality. IMPRESSION: 1. Left lower lung opacities, which could represent atelectasis or pneumonia. Electronically signed by: Gilmore Molt MD 02/28/2024 03:56 AM EST RP Workstation: [id number]    EKG: I independently viewed the EKG done and my findings are as followed: Sinus rhythm rate of 92.  Nonspecific ST-T changes.  QTc 492.  Assessment/Plan Present on Admission:  CAP (community acquired pneumonia)  Principal Problem:   CAP (community acquired pneumonia)  Community-acquired pneumonia, POA Continue Rocephin  and IV azithromycin As needed bronchodilators As needed antitussives  Elevated troponin, suspect demand ischemia in the setting of  pneumonia. High-sensitivity troponin peaked at 310 and downtrended, 25 Denies any anginal symptoms Resume home aspirin  and statin Monitor on telemetry Follow transthoracic echocardiogram.  Chronic combined diastolic and systolic CHF BNP greater than 7899 Does not appear volume overload No JVD no peripheral edema. Last 2D echo done on 04/01/2023 revealed LVEF 20 to 25% with grade 3 diastolic dysfunction. Monitor strict I's and O's and daily weight. Follow updated transthoracic echocardiogram  Moderate to severe pulmonary hypertension Follow-up transthoracic echocardiogram Resume home regimen.  Coronary artery disease Peripheral artery disease Resume home regimen.  CKD 3B Renal function close to baseline Monitor urine output Avoid nephrotoxic agents, dehydration, and hypotension.  Chronic hypoxia, on 2 L nasal cannula continuously Wean off O2 supplementation as tolerated Maintain O2 saturation above 90%.  Generalized weakness PT OT evaluation Ambulates with a walker Fall precautions.  Dementia Reorient as needed.  Code stroke, canceled Code stroke was called by EMS due to reported left-sided deviation No focal deficits on exam-no need to pursue TIA/stroke workup at this time, per neurology. Monitor on telemetry   Critical care time: 55 minutes.   DVT prophylaxis: Subcu Lovenox  daily.  Code Status: DNR/DNI.  Family Communication: None at bedside.  Disposition Plan: Admitted to progressive care unit.  Consults called: None.  Admission status: Inpatient status.   Status is: Inpatient The patient requires at least 2 midnights for further evaluation and treatment of present condition.   Terry LOISE Hurst MD [facility] Pager [phone]  If 7PM-7AM, please contact night-coverage [email or web address] Password [email or web address]  02/28/2024, 5:59 AM

## 2024-02-28 NOTE — ED Notes (Signed)
 Before getting PT upstairs , we noticed PT has a IV that has been pulled out.

## 2024-02-28 NOTE — Progress Notes (Signed)
  Echocardiogram 2D Echocardiogram has been performed.  Norleen ORN [facility] 02/28/2024, 11:28 AM

## 2024-02-28 NOTE — ED Notes (Signed)
 2 light green, blue and lavender top sent to lab.

## 2024-02-28 NOTE — ED Provider Notes (Signed)
 [place] EMERGENCY DEPARTMENT AT [facility] Provider Note   CSN: [id number] Arrival date & time: 02/28/24  9843     Patient presents with: Code Stroke   [name] [name] is a 88 y.o. female.   The history is provided by the EMS personnel. The history is limited by the condition of the patient (Altered mental status).   She has history of hypertension, diabetes, stroke, breast cancer and was brought in by ambulance as a code stroke.  She was reported to have last been seen normal at 9 PM, found to be hypoxic with left gaze deviation and right sided weakness.  EMS reports hypoxia with oxygen saturation in the 70s and only improved to 86% on a nonrebreather mask.  No other history is obtainable at this time.    Prior to Admission medications   Medication Sig Start Date End Date Taking? Authorizing Provider  allopurinol  (ZYLOPRIM ) 100 MG tablet Take 1 tablet by mouth daily. 03/05/23   [provider]  aspirin  EC 81 MG tablet Take 1 tablet (81 mg total) by mouth daily. Swallow whole. 04/04/23   Alexander, Natalie, DO  atorvastatin  (LIPITOR) 40 MG tablet Take 1 tablet (40 mg total) by mouth daily. 08/30/23   Lenon Marien CROME, MD  busPIRone  (BUSPAR ) 7.5 MG tablet Take 7.5 mg by mouth 2 (two) times daily. 07/10/23 07/09/24  [provider]  clopidogrel  (PLAVIX ) 75 MG tablet Take 1 tablet (75 mg total) by mouth daily. 08/30/23   Lenon Marien CROME, MD  colchicine  0.6 MG tablet Take 0.6 mg by mouth daily as needed. 08/12/23   [provider]  dapagliflozin  propanediol (FARXIGA ) 10 MG TABS tablet Take 1 tablet (10 mg total) by mouth daily. 07/20/23   Dorinda Drue DASEN, MD  furosemide  (LASIX ) 20 MG tablet Take 20 mg by mouth daily. 08/04/23   [provider]  loperamide  (LOPERAMIDE  A-D) 2 MG tablet Take 2 mg by mouth as needed for diarrhea or loose stools.    [provider]  losartan  (COZAAR ) 25 MG tablet Take 12.5 mg by mouth daily. 08/12/23    [provider]  memantine  (NAMENDA ) 10 MG tablet Take 2 tablets by mouth every evening.  10/26/15   [provider]  metoprolol  succinate (TOPROL -XL) 25 MG 24 hr tablet Take 0.5 tablets (12.5 mg total) by mouth at bedtime. 08/30/23 09/29/23  Lenon Marien CROME, MD  nystatin (MYCOSTATIN/NYSTOP) powder Apply 1 Application topically 3 (three) times daily. 08/04/23   [provider]  risperiDONE  (RISPERDAL ) 0.25 MG tablet Take 1 tablet (0.25 mg total) by mouth at bedtime. 08/30/23 09/29/23  Lenon Marien CROME, MD  risperiDONE  (RISPERDAL ) 0.5 MG tablet Take 0.5 mg by mouth 2 (two) times daily as needed. 08/15/23   [provider]  sodium chloride  (OCEAN) 0.65 % SOLN nasal spray Place 1 spray into both nostrils as needed for congestion. Patient not taking: Reported on 08/21/2023    [provider]  spironolactone  (ALDACTONE ) 25 MG tablet Take 12.5 mg by mouth daily. 08/12/23   [provider]    Allergies: Codeine, Etodolac, Indomethacin, Penicillins, Tizanidine, and Tramadol    Review of Systems  Unable to perform ROS: Mental status change    Updated Vital Signs BP 99/78 (BP Location: Right Arm)   Pulse 81   Temp (!) 96.8 F (36 C) (Axillary)   Resp 17   Ht 5' 5 (1.651 m)   Wt 67 kg   SpO2 (!) 70%   BMI 24.58 kg/m  Physical Exam Vitals and nursing note reviewed.   88 year old female, resting comfortably and in no acute distress. Vital signs are normal. Oxygen saturation is 70%, which is hypoxic. Head is normocephalic and atraumatic. PERRLA, EOMI. Oropharynx is clear. Neck is nontender and supple without adenopathy. Lungs are clear without rales, wheezes, or rhonchi. Chest is nontender. Heart has regular rate and rhythm without murmur. Abdomen is soft, flat, nontender. Extremities have no cyanosis or edema. Skin is warm and dry without rash. Neurologic: Awake and alert, able to answer questions with yes/no.  Cranial nerves are intact  with no evidence of facial droop or gaze preference.  She moves all extremities equally, sensation is grossly intact in all 4 extremities, normal coordination noted on touching her nose.  (all labs ordered are listed, but only abnormal results are displayed) Labs Reviewed  I-STAT CHEM 8, ED - Abnormal; Notable for the following components:      Result Value   Glucose, Bld 130 (*)    Calcium , Ion 0.97 (*)    TCO2 21 (*)    Hemoglobin 18.0 (*)    HCT 53.0 (*)    All other components within normal limits  I-STAT ARTERIAL BLOOD GAS, ED - Abnormal; Notable for the following components:   pH, Arterial 7.526 (*)    pCO2 arterial 20.4 (*)    pO2, Arterial 177 (*)    Bicarbonate 17.1 (*)    TCO2 18 (*)    Acid-base deficit 3.0 (*)    HCT 47.0 (*)    Hemoglobin 16.0 (*)    All other components within normal limits  PROTIME-INR  APTT  CBC  DIFFERENTIAL  COMPREHENSIVE METABOLIC PANEL WITH GFR  ETHANOL  BRAIN NATRIURETIC PEPTIDE  URINALYSIS, W/ REFLEX TO CULTURE (INFECTION SUSPECTED)  MAGNESIUM   CBG MONITORING, ED  I-STAT ARTERIAL BLOOD GAS, ED  TROPONIN I (HIGH SENSITIVITY)    EKG: None  Radiology: No results found.   Procedures   Medications Ordered in the ED  sodium chloride  flush (NS) 0.9 % injection 3 mL (has no administration in time range)                                    Medical Decision Making Amount and/or Complexity of Data Reviewed Labs: ordered. Radiology: ordered.  Risk Decision regarding hospitalization.   Report of stroke without findings of stroke on arrival.  Hypoxia of uncertain cause.  Patient was seen in conjunction with Dr.  Vanessa of neurology service, code stroke was canceled because no evidence of acute stroke on arrival to the ED.  Also, oxygen saturation although reported to be low was with a poor waveform on the monitor.  Emergent arterial blood gas actually showed good oxygen saturation and patient is maintained on oxygen at 2 L/min.   I have ordered routine labs and chest x-ray.  At this point, no indication for CT of head.  I have reviewed her past records, and note ED visit on 12/23/2023 for dementia.  Chest x-ray shows retrocardiac density which is likely pneumonia.  I have independently viewed the images, and agree with the radiologist's interpretation.  I have reviewed her laboratory tests, my interpretation is elevated troponin which appears to be chronic, undetectable ethanol level, normal CBC, markedly elevated BNP which is higher than her baseline, arterial blood gas showing respiratory alkalosis and pO2 of 177, normal i-STAT Chem-8 except for minimally elevated glucose, urinalysis significant  for UTI with positive nitrite and greater than 50 WBCs and many bacteria.  I have ordered ceftriaxone  and azithromycin  which would treat both pneumonia and urinary tract infection.  At this point, with no evidence of stroke there is no need to proceed with CNS imaging.  Report of gaze preference and weakness prior to arrival was likely secondary to hypoxia, no need to proceed with TIA evaluation.  I have discussed the case with Dr. Shona of [facility], who agrees to admit the patient  CRITICAL CARE Performed by: Alm Lias Total critical care time: 85 minutes Critical care time was exclusive of separately billable procedures and treating other patients. Critical care was necessary to treat or prevent imminent or life-threatening deterioration. Critical care was time spent personally by me on the following activities: development of treatment plan with patient and/or surrogate as well as nursing, discussions with consultants, evaluation of patient's response to treatment, examination of patient, obtaining history from patient or surrogate, ordering and performing treatments and interventions, ordering and review of laboratory studies, ordering and review of radiographic studies, pulse oximetry and re-evaluation of patient's  condition.     Final diagnoses:  Community acquired pneumonia of left lower lobe of lung  Elevated troponin  Elevated brain natriuretic peptide (BNP) level  Urinary tract infection without hematuria, site unspecified    ED Discharge Orders     None          Lias Alm, MD 02/28/24 0602

## 2024-02-28 NOTE — ED Notes (Signed)
 CCMD called for cardiac monitoring.

## 2024-02-28 NOTE — ED Triage Notes (Signed)
 Pt BIB [facility] from [facility]. Facility staff reports pt was hypoxic and had left side deviation, per EMS pt was LVO +3. On arrival to the [facility] ER, pt immediately assessed by EDP and stroke team. Code stroke canceled by Neurologist.

## 2024-02-28 NOTE — Progress Notes (Signed)
 Brief Neuro Note:  EMS brought her in as a code stroke for generalized weakness, poor responsinveness and concern for L gaze deviation. However, patient was hypoxic and satting in 70s in the field and as her sats improved to 80s, no focal deficits were noted. She has symmetric smile, makes eye contact on left and right and 5/5 grip strength in BL hands and raises BL lower extremities off the bed with no drift.  Discussed with Dr. Raford and hypoxia explains her presentation and she is still satting around 22s on 7L. No concern for stroke. We will cancel the code stroke and patient is not a candidate for tnkase or thrombectomy due to low suspicoin for stroke. She is outside tnkase window too.  We will signoff.  Alanzo Lamb [facility]

## 2024-02-28 NOTE — Progress Notes (Signed)
 PROGRESS NOTE    [name] [name]  FMW:[id number] DOB: December 08, 1935 DOA: 02/28/2024 PCP: [facility]   Brief Narrative:   [name] [name] is a 88 y.o. female with medical history significant for chronic combined diastolic and systolic CHF, moderate to severe pulmonary hypertension, essential hypertension, type 2 diabetes, peripheral artery disease, coronary artery disease, chronic hypoxia on 2 L nasal cannula continuously, CKD 3B, dementia, who presents to the ER from SNF initially as a code stroke.  Reportedly the patient was hypoxic and had left-sided deviation.   Upon arrival to the ER, the patient was evaluated by stroke team and was found to have no focal deficits.  Code stroke was canceled by neurologist.  Further workup revealed chest x-ray showing left lower lobe opacities concerning for pneumonia.  Also revealed elevated greater than 2100 and troponin that peaked at 310, then down trended to 285.  Not volume overload on exam.  Denies chest pain.  The patient was started on IV antibiotics, Rocephin , IV azithromycin, due to concern for community-acquired pneumonia.  Admitted by [facility], hospitalist service.   At the time of the visit, the patient is alert, confused in the setting of dementia.  She has no complaints.   ED Course: Temperature 97.8.  BP 93/68, pulse 75, respiration rate 11, O2 saturation 99% on 4 L.   Assessment & Plan:   Principal Problem:   CAP (community acquired pneumonia) Active Problems:   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)   Chronic HFrEF (heart failure with reduced ejection fraction) (HCC)   Pneumonia due to infectious organism   Pulmonary hypertension, moderate to severe (HCC)   Community-acquired pneumonia, POA Continue Rocephin  and IV azithromycin As needed bronchodilators As needed antitussives   Elevated troponin, suspect demand ischemia in the setting of pneumonia. High-sensitivity troponin peaked at 310 and downtrended,  25 Denies any anginal symptoms Resume home aspirin  and statin Monitor on telemetry Follow transthoracic echocardiogram.   Chronic combined diastolic and systolic CHF BNP greater than 7899 Does not appear volume overload Has some peripheral edema. Last 2D echo done on 04/01/2023 revealed LVEF 20 to 25% with grade 3 diastolic dysfunction. Monitor strict I's and O's and daily weight. Follow updated transthoracic echocardiogram Doesn't appear to be on b Figgs, diuretics per MAR review Asa, statin, farxiga  continued   Moderate to severe pulmonary hypertension Follow-up transthoracic echocardiogram  Abnormal UA: concern for UTI, ?symptoms.   Coronary artery disease Peripheral artery disease Resume home regimen.   CKD 3B Renal function close to baseline Monitor urine output Avoid nephrotoxic agents, dehydration, and hypotension.   Chronic hypoxia, on 2 L nasal cannula continuously Wean off O2 supplementation as tolerated Maintain O2 saturation above 90%.   Generalized weakness PT OT evaluation Ambulates with a walker Fall precautions.   Dementia Reorient as needed.   Code stroke, canceled Code stroke was called by EMS due to reported left-sided deviation No focal deficits on exam-no need to pursue TIA/stroke workup at this time, per neurology. Monitor on telemetry       DVT prophylaxis: Subcu Lovenox  daily.   Code Status: DNR/DNI.   Family Communication: None at bedside.   Disposition Plan: Admitted to progressive care unit.   Consults called: None.   Admission status: Inpatient status.     Status is: Inpatient The patient requires at least 2 midnights for further evaluation and treatment of present condition.    Subjective:  Pt seen and examined at the bedside. She is alert and oriented x2. VSS.  SBP mostly in 100s. SpO2 98-99% on 2L. Pt denies cough, sob.  Objective: Vitals:   02/28/24 0730 02/28/24 0815 02/28/24 1030 02/28/24 1230  BP: 103/79  106/69 108/80 97/73  Pulse:   85 80  Resp: 12 11 12 12   Temp:   (!) 97.4 F (36.3 C)   TempSrc:   Axillary   SpO2:   99% 98%  Weight:      Height:       No intake or output data in the 24 hours ending 02/28/24 1424 Filed Weights   02/28/24 0206 02/28/24 0234  Weight: 67.3 kg 67 kg    Examination:  General exam: alert, oriented x2 Respiratory system: Bilateral decreased breath sounds, crackles at bases Cardiovascular system: S1 & S2 heard, Rate controlled Gastrointestinal system: Abdomen is nondistended, soft and nontender. Normal bowel sounds heard. Extremities: +edema  Central nervous system: Alert and oriented to place and person. No focal neurological deficits. Moving extremities Skin: No rashes, lesions or ulcers    Data Reviewed: I have personally reviewed following labs and imaging studies  CBC: Recent Labs  Lab 02/28/24 0158 02/28/24 0215 02/28/24 0219  WBC 4.6  --   --   NEUTROABS 3.1  --   --   HGB 14.7 18.0* 16.0*  HCT 44.6 53.0* 47.0*  MCV 88.8  --   --   PLT 185  --   --    Basic Metabolic Panel: Recent Labs  Lab 02/28/24 0215 02/28/24 0217 02/28/24 0219 02/28/24 0425  NA 143  --  144 144  K 4.2  --  3.5 3.7  CL 109  --   --  110  CO2  --   --   --  20*  GLUCOSE 130*  --   --  124*  BUN 23  --   --  17  CREATININE 0.90  --   --  1.00  CALCIUM   --   --   --  8.8*  MG  --  1.8  --   --    GFR: Estimated Creatinine Clearance: 36.3 mL/min (by C-G formula based on SCr of 1 mg/dL). Liver Function Tests: Recent Labs  Lab 02/28/24 0425  AST 37  ALT 24  ALKPHOS 116  BILITOT 1.7*  PROT 6.6  ALBUMIN 2.6*   No results for input(s): LIPASE, AMYLASE in the last 168 hours. No results for input(s): AMMONIA in the last 168 hours. Coagulation Profile: Recent Labs  Lab 02/28/24 0425  INR 1.3*   Cardiac Enzymes: No results for input(s): CKTOTAL, CKMB, CKMBINDEX, TROPONINI in the last 168 hours. BNP (last 3 results) No results  for input(s): PROBNP in the last 8760 hours. HbA1C: No results for input(s): HGBA1C in the last 72 hours. CBG: Recent Labs  Lab 02/28/24 0159  GLUCAP 83   Lipid Profile: No results for input(s): CHOL, HDL, LDLCALC, TRIG, CHOLHDL, LDLDIRECT in the last 72 hours. Thyroid  Function Tests: No results for input(s): TSH, T4TOTAL, FREET4, T3FREE, THYROIDAB in the last 72 hours. Anemia Panel: No results for input(s): VITAMINB12, FOLATE, FERRITIN, TIBC, IRON, RETICCTPCT in the last 72 hours. Sepsis Labs: No results for input(s): PROCALCITON, LATICACIDVEN in the last 168 hours.  No results found for this or any previous visit (from the past 240 hours).       Radiology Studies: DG Chest Port 1 View Result Date: 02/28/2024 EXAM: 1 VIEW(S) XRAY OF THE CHEST 02/28/2024 03:12:00 AM COMPARISON: None available. CLINICAL HISTORY: hypoxia hypoxia FINDINGS: LUNGS AND PLEURA:  Left lower lung opacities. Chronic coarsened interstitial markings. No pleural effusion. No pneumothorax. HEART AND MEDIASTINUM: Similar enlarged cardiomediastinal silhouette.  Aortic atherosclerosis. BONES AND SOFT TISSUES: No acute osseous abnormality. IMPRESSION: 1. Left lower lung opacities, which could represent atelectasis or pneumonia. Electronically signed by: Gilmore Molt MD 02/28/2024 03:56 AM EST RP Workstation: [id number]        Scheduled Meds:  aspirin   81 mg Oral Daily   atorvastatin   40 mg Oral Daily   enoxaparin  (LOVENOX ) injection  40 mg Subcutaneous Daily   Continuous Infusions:  azithromycin (ZITHROMAX) 500 mg in sodium chloride  0.9 % 250 mL IVPB     cefTRIAXone  (ROCEPHIN )  IV            Keiona Jenison, MD [facility] 02/28/2024, 2:24 PM

## 2024-02-29 DIAGNOSIS — J189 Pneumonia, unspecified organism: Secondary | ICD-10-CM | POA: Diagnosis not present

## 2024-02-29 LAB — CBC WITH DIFFERENTIAL/PLATELET
Abs Immature Granulocytes: 0.05 K/uL (ref 0.00–0.07)
Basophils Absolute: 0 K/uL (ref 0.0–0.1)
Basophils Relative: 1 %
Eosinophils Absolute: 0.1 K/uL (ref 0.0–0.5)
Eosinophils Relative: 2 %
HCT: 45.8 % (ref 36.0–46.0)
Hemoglobin: 14.8 g/dL (ref 12.0–15.0)
Immature Granulocytes: 1 %
Lymphocytes Relative: 21 %
Lymphs Abs: 1.3 K/uL (ref 0.7–4.0)
MCH: 29.1 pg (ref 26.0–34.0)
MCHC: 32.3 g/dL (ref 30.0–36.0)
MCV: 90 fL (ref 80.0–100.0)
Monocytes Absolute: 0.5 K/uL (ref 0.1–1.0)
Monocytes Relative: 8 %
Neutro Abs: 4.2 K/uL (ref 1.7–7.7)
Neutrophils Relative %: 67 %
Platelets: 211 K/uL (ref 150–400)
RBC: 5.09 MIL/uL (ref 3.87–5.11)
RDW: 21.3 % — ABNORMAL HIGH (ref 11.5–15.5)
Smear Review: NORMAL
WBC: 6.2 K/uL (ref 4.0–10.5)
nRBC: 0 % (ref 0.0–0.2)

## 2024-02-29 LAB — BASIC METABOLIC PANEL WITH GFR
Anion gap: 15 (ref 5–15)
BUN: 16 mg/dL (ref 8–23)
CO2: 19 mmol/L — ABNORMAL LOW (ref 22–32)
Calcium: 8.4 mg/dL — ABNORMAL LOW (ref 8.9–10.3)
Chloride: 105 mmol/L (ref 98–111)
Creatinine, Ser: 0.96 mg/dL (ref 0.44–1.00)
GFR, Estimated: 58 mL/min — ABNORMAL LOW (ref >60)
Glucose, Bld: 166 mg/dL — ABNORMAL HIGH (ref 70–99)
Potassium: 3.8 mmol/L (ref 3.5–5.1)
Sodium: 139 mmol/L (ref 135–145)

## 2024-02-29 LAB — MAGNESIUM: Magnesium: 1.9 mg/dL (ref 1.7–2.4)

## 2024-02-29 LAB — PROCALCITONIN: Procalcitonin: <0.1 ng/mL

## 2024-02-29 LAB — C-REACTIVE PROTEIN: CRP: 1.3 mg/dL — ABNORMAL HIGH (ref <1.0)

## 2024-02-29 MED ORDER — FUROSEMIDE 10 MG/ML IJ SOLN
20.0000 mg | Freq: Once | INTRAMUSCULAR | Status: AC
  Administered 2024-02-29: 20 mg via INTRAVENOUS
  Filled 2024-02-29: qty 2

## 2024-02-29 MED ORDER — HALOPERIDOL LACTATE 5 MG/ML IJ SOLN: 2.0000 mg | Freq: Four times a day (QID) | INTRAMUSCULAR | Status: DC | PRN

## 2024-02-29 NOTE — [place] FL2 (Addendum)
   MEDICAID FL2 LEVEL OF CARE FORM     IDENTIFICATION  Patient Name: [name] [name] Birthdate: October 18, 1935 Sex: female Admission Date (Current Location): 02/28/2024  [facility] and [place] Number:  Producer, Television/film/video and Address:  [facility] [place], [address] [place], [place] [address]      Provider Number: [id number]  Attending Physician Name and Address:  Dennise Lavada POUR, MD  Relative Name and Phone Number:  Sharman Holt (Daughter) [phone]  Josette Holt (Granddaughter) [phone]    Current Level of Care: Hospital Recommended Level of Care: Skilled Nursing Facility Prior Approval Number:    Date Approved/Denied:   PASRR Number: [id number]  Discharge Plan: SNF    Current Diagnoses: Patient Active Problem List   Diagnosis Date Noted   CAP (community acquired pneumonia) 02/28/2024   Ischemic cardiomyopathy 08/23/2023   HFrEF (heart failure with reduced ejection fraction) (HCC) 08/22/2023   NSTEMI (non-ST elevated myocardial infarction) (HCC) 08/21/2023   Pneumonia due to infectious organism 08/21/2023   Weakness 08/21/2023   Hypomagnesemia 07/15/2023   NSVT (nonsustained ventricular tachycardia) (HCC) 07/15/2023   Chronic HFrEF (heart failure with reduced ejection fraction) (HCC) 07/14/2023   Pulmonary hypertension, moderate to severe (HCC) 07/06/2023   Bilateral lower extremity edema 04/01/2023   Lightheadedness 04/01/2023   RVF (right ventricular failure) (HCC) 04/01/2023   Acute on chronic systolic CHF (congestive heart failure) (HCC) 03/31/2023   Acute on chronic systolic heart failure (HCC) 03/31/2023   History of CVA (cerebrovascular accident) 03/31/2023   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) 07/06/2020   Troponin level elevated 02/03/2018   Diabetes (HCC) 09/12/2017   Hyperlipidemia 09/12/2017   PAD (peripheral artery disease) 06/09/2017   Essential hypertension 06/09/2014   H/O: duodenal ulcer  06/13/2013   Personal history of breast cancer 06/13/2013    Orientation RESPIRATION BLADDER Height & Weight     Self  O2 (4L o2 [place]) External catheter Weight: 136 lb 3.9 oz (61.8 kg) Height:  5' 5 (165.1 cm)  BEHAVIORAL SYMPTOMS/MOOD NEUROLOGICAL BOWEL NUTRITION STATUS      Continent Diet (See D/C Summary)  AMBULATORY STATUS COMMUNICATION OF NEEDS Skin   Extensive Assist Verbally Normal                       Personal Care Assistance Level of Assistance  Bathing, Feeding, Dressing Bathing Assistance: Maximum assistance Feeding assistance: Limited assistance Dressing Assistance: Maximum assistance     Functional Limitations Info  Sight, Hearing Sight Info: Impaired Hearing Info: Impaired      SPECIAL CARE FACTORS FREQUENCY  PT (By licensed PT), OT (By licensed OT)     PT Frequency: 5x/week or as prior OT Frequency: 5x/week or as prior            Contractures Contractures Info: Not present    Additional Factors Info  Allergies, Code Status Code Status Info: DNR-Limited Allergies Info: Codeine Etodolac Indomethacin Penicillins Tizanidine Tramadol           Current Medications (02/29/2024):  This is the current hospital active medication list Current Facility-Administered Medications  Medication Dose Route Frequency Provider Last Rate Last Admin   acetaminophen  (TYLENOL ) tablet 500 mg  500 mg Oral Q6H PRN Hall, Carole N, DO       aspirin  chewable tablet 81 mg  81 mg Oral Daily Shona Laurence N, DO   81 mg at 02/29/24 0932   atorvastatin  (LIPITOR) tablet 40 mg  40 mg Oral Daily  Shona Laurence N, DO   40 mg at 02/29/24 0932   azithromycin  (ZITHROMAX ) 500 mg in sodium chloride  0.9 % 250 mL IVPB  500 mg Intravenous Q24H Shona Laurence SAILOR, DO 250 mL/hr at 02/28/24 2238 500 mg at 02/28/24 2238   brexpiprazole  (REXULTI ) tablet 2 mg  2 mg Oral Daily Sigdel, Derryl, MD   2 mg at 02/29/24 0932   busPIRone  (BUSPAR ) tablet 7.5 mg  7.5 mg Oral BID Sigdel, Santosh, MD   7.5 mg  at 02/29/24 0932   cefTRIAXone  (ROCEPHIN ) 2 g in sodium chloride  0.9 % 100 mL IVPB  2 g Intravenous Q24H Shona Laurence N, DO 200 mL/hr at 02/28/24 2128 2 g at 02/28/24 2128   dapagliflozin  propanediol (FARXIGA ) tablet 10 mg  10 mg Oral Daily Sigdel, Santosh, MD   10 mg at 02/29/24 0932   enoxaparin  (LOVENOX ) injection 40 mg  40 mg Subcutaneous Daily Shona Laurence SAILOR, DO   40 mg at 02/29/24 9068   guaiFENesin -dextromethorphan (ROBITUSSIN DM) 100-10 MG/5ML syrup 5 mL  5 mL Oral Q4H PRN Shona Laurence N, DO       ipratropium-albuterol  (DUONEB) 0.5-2.5 (3) MG/3ML nebulizer solution 3 mL  3 mL Nebulization Q2H PRN Hall, Carole N, DO       LORazepam  (ATIVAN ) tablet 0.5 mg  0.5 mg Oral Q12H PRN Sigdel, Santosh, MD       melatonin tablet 5 mg  5 mg Oral QHS PRN Shona Laurence N, DO       memantine  (NAMENDA ) tablet 20 mg  20 mg Oral BID Sigdel, Santosh, MD   20 mg at 02/29/24 0931   polyethylene glycol (MIRALAX  / GLYCOLAX ) packet 17 g  17 g Oral Daily PRN Shona Laurence N, DO       prochlorperazine  (COMPAZINE ) injection 5 mg  5 mg Intravenous Q6H PRN Shona Laurence SAILOR, DO         Discharge Medications: Please see discharge summary for a list of discharge medications.  Relevant Imaging Results:  Relevant Lab Results:   Additional Information SSN [id number]; Active with [facility]  [place], LCSWA

## 2024-02-29 NOTE — TOC Initial Note (Signed)
 Transition of Care [facility]) - Initial/Assessment Note    Patient Details  Name: [name] [name] MRN: [id number] Date of Birth: December 05, 1935  Transition of Care [facility]) CM/SW Contact:    Bernardino Dean, LCSWA Phone Number: 02/29/2024, 10:19 AM  Clinical Narrative:                 [facility] consult for SNF placement received. Chart reviewed. Attempted to reach out to both family members on chart, unsuccessful. SW spoke with Therisa at [facility]. Patient is LTC there, recently transferred there from [facility]. FL2 completed. TOC will follow for eventual return.   Expected Discharge Plan: Skilled Nursing Facility Barriers to Discharge: Continued Medical Work up   Patient Goals and CMS Choice Patient states their goals for this hospitalization and ongoing recovery are:: Return to LTC at [facility]          Expected Discharge Plan and Services In-house Referral: Clinical Social Work     Living arrangements for the past 2 months: Skilled Nursing Facility                                      Prior Living Arrangements/Services Living arrangements for the past 2 months: Skilled Nursing Facility   Patient language and need for interpreter reviewed:: Yes Do you feel safe going back to the place where you live?: Yes      Need for Family Participation in Patient Care: Yes (Comment) Care giver support system in place?: Yes (comment)   Criminal Activity/Legal Involvement Pertinent to Current Situation/Hospitalization: No - Comment as needed  Activities of Daily Living      Permission Sought/Granted                  Emotional Assessment Appearance:: Appears stated age   Affect (typically observed): Appropriate Orientation: : Oriented to Self   Psych Involvement: No (comment)  Admission diagnosis:  Elevated troponin [R79.89] CAP (community acquired pneumonia) [J18.9] Elevated brain natriuretic peptide (BNP) level [R79.89] Urinary tract infection without hematuria, site  unspecified [N39.0] Community acquired pneumonia of left lower lobe of lung [J18.9] Patient Active Problem List   Diagnosis Date Noted   CAP (community acquired pneumonia) 02/28/2024   Ischemic cardiomyopathy 08/23/2023   HFrEF (heart failure with reduced ejection fraction) (HCC) 08/22/2023   NSTEMI (non-ST elevated myocardial infarction) (HCC) 08/21/2023   Pneumonia due to infectious organism 08/21/2023   Weakness 08/21/2023   Hypomagnesemia 07/15/2023   NSVT (nonsustained ventricular tachycardia) (HCC) 07/15/2023   Chronic HFrEF (heart failure with reduced ejection fraction) (HCC) 07/14/2023   Pulmonary hypertension, moderate to severe (HCC) 07/06/2023   Bilateral lower extremity edema 04/01/2023   Lightheadedness 04/01/2023   RVF (right ventricular failure) (HCC) 04/01/2023   Acute on chronic systolic CHF (congestive heart failure) (HCC) 03/31/2023   Acute on chronic systolic heart failure (HCC) 03/31/2023   History of CVA (cerebrovascular accident) 03/31/2023   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) 07/06/2020   Troponin level elevated 02/03/2018   Diabetes (HCC) 09/12/2017   Hyperlipidemia 09/12/2017   PAD (peripheral artery disease) 06/09/2017   Essential hypertension 06/09/2014   H/O: duodenal ulcer 06/13/2013   Personal history of breast cancer 06/13/2013   PCP:  [facility] Pharmacy:   [facility] - [place], [place] - [address] [place] [address] Phone: [phone] Fax: [phone]  Social Drivers of Health (SDOH) Social History: SDOH Screenings   Food Insecurity: No Food Insecurity (08/21/2023)  Housing: Low Risk  (08/21/2023)  Transportation Needs: No Transportation Needs (08/21/2023)  Utilities: Not At Risk (08/21/2023)  Financial Resource Strain: Low Risk  (02/21/2023)   Received from [facility]  Social Connections: Socially Isolated (08/21/2023)  Tobacco  Use: Medium Risk (12/08/2023)   SDOH Interventions:     Readmission Risk Interventions    08/22/2023    2:18 PM  Readmission Risk Prevention Plan  Transportation Screening Complete  Medication Review (RN Care Manager) Complete  PCP or Specialist appointment within 3-5 days of discharge Complete  HRI or Home Care Consult Complete  SW Recovery Care/Counseling Consult Complete  Palliative Care Screening Not Applicable  Skilled Nursing Facility Not Complete  SNF Comments Will complete tomorrow if she will meet 3-night inpatient stay.

## 2024-02-29 NOTE — Plan of Care (Signed)

## 2024-02-29 NOTE — Progress Notes (Addendum)
 PROGRESS NOTE     Patient Demographics:    [name] [name], is a 88 y.o. female, DOB - 1935/09/29, FMW:[id number]  Outpatient Primary MD for the patient is [facility]    LOS - 1  Admit date - 02/28/2024    Chief Complaint  Patient presents with   Code Stroke       Brief Narrative (HPI from H&P)   88 y.o. female with medical history significant for chronic combined diastolic and systolic CHF, moderate to severe pulmonary hypertension, essential hypertension, type 2 diabetes, peripheral artery disease, coronary artery disease, chronic hypoxia on 2 L nasal cannula continuously, CKD 3B, dementia, who presents to the ER from SNF initially as a code stroke.  Reportedly the patient was hypoxic and had left-sided deviation.   Upon arrival to the ER, the patient was evaluated by stroke team and was found to have no focal deficits.  Code stroke was canceled by neurologist.  Further workup revealed chest x-ray showing left lower lobe opacities concerning for pneumonia.  She was admitted for pneumonia treatment.   Subjective:    [name] [name] today has, No headache, No chest pain, No abdominal pain - No Nausea, No new weakness tingling or numbness, no SOB   Assessment  & Plan :   Community-acquired pneumonia, POA  Continue Rocephin  and IV azithromycin  As needed bronchodilators As needed antitussives, clinically improved advance activity and titrate down oxygen, encouraged to sit in chair use I-S and flutter valve for pulmonary toiletry.   Elevated troponin, suspect demand ischemia in the setting of pneumonia, underlying history of chronic combined systolic and diastolic CHF with EF around 20%.  High-sensitivity  troponin peaked at 310 and downtrended, 25, no chest pain, trend is not consistent with ACS, on aspirin  and statin, monitor telemetry.  Outpatient cardiology follow-up.   Mild acute chronic combined diastolic and systolic CHF EF 20% - Asa, statin, farxiga  continued, gentle one-time Lasix  and monitor.  Blood pressure soft.  Trace leg edema.   Moderate to severe pulmonary hypertension - No acute issues.  Follow-up with cardiology outpatient.   Abnormal UA:  concern for UTI, no dysuria monitor.  Already on antibiotics for #1 above   Coronary artery disease Peripheral artery disease  Resumed home regimen.   CKD 3B  Able continue to monitor   Chronic hypoxia, on 2 L nasal cannula continuously Wean off O2 supplementation as tolerated Maintain O2 saturation above 90%.   Generalized weakness  PT OT evaluation Ambulates with a walker Fall precautions.   Dementia Reorient as needed.  Risk for hospital-acquired delirium, minimize narcotics and benzodiazepines, exposed to sunlight. Bed bound.   Code stroke, canceled Code stroke was called by EMS due to reported left-sided deviation No focal deficits on exam-no need to pursue TIA/stroke workup at this time, per neurology.      Condition - Extremely Guarded  Family Communication  :  daughter [phone] called 02/29/2024 at 8:10 AM and message left, updated again on 02/29/24  Code Status : Full code  Consults  : Neurology  PUD Prophylaxis :     Procedures  :     Echocardiogram - 1. Left ventricular ejection fraction, by estimation, is <20%. The left ventricle has severely decreased function. The left ventricle demonstrates global hypokinesis. There is mild concentric left ventricular hypertrophy. Left ventricular diastolic parameters are indeterminate.  2. Right ventricular systolic function is severely reduced. The right ventricular size is moderately enlarged. There is moderately elevated pulmonary artery systolic pressure. The estimated  right ventricular systolic pressure is 48.2 mmHg.  3. Left atrial size was moderately dilated.  4. Right atrial size was moderately dilated.  5. The mitral valve is normal in structure. Moderate mitral valve regurgitation. No evidence of mitral stenosis.  6. Tricuspid valve regurgitation is severe.  7. The aortic valve is tricuspid. There is mild calcification of the aortic valve. Aortic valve regurgitation is not visualized. No aortic stenosis is present.  8. The inferior vena cava is dilated in size with <50% respiratory variability, suggesting right atrial pressure of 15 mmHg.      Disposition Plan  :    Status is: Inpatient   DVT Prophylaxis  :    enoxaparin  (LOVENOX ) injection 40 mg Start: 02/28/24 1000    Lab Results  Component Value Date   PLT 211 02/29/2024    Diet :  Diet Order             DIET DYS 3 Room service appropriate? Yes; Fluid consistency: Thin  Diet effective now                    Inpatient Medications  Scheduled Meds:  aspirin   81 mg Oral Daily   atorvastatin   40 mg Oral Daily   brexpiprazole  2 mg Oral Daily   busPIRone   7.5 mg Oral BID   dapagliflozin  propanediol  10 mg Oral Daily   enoxaparin  (LOVENOX ) injection  40 mg Subcutaneous Daily   memantine   20 mg Oral BID   Continuous Infusions:  azithromycin (ZITHROMAX) 500 mg in sodium chloride  0.9 % 250 mL IVPB 500 mg (02/28/24 2238)   cefTRIAXone  (ROCEPHIN )  IV 2 g (02/28/24 2128)   PRN Meds:.acetaminophen , guaiFENesin-dextromethorphan, ipratropium-albuterol , LORazepam, melatonin, polyethylene glycol, prochlorperazine    Objective:   Vitals:   02/29/24 0200 02/29/24 0300 02/29/24 0400 02/29/24 0745  BP:   96/69 106/74  Pulse:   83 74  Resp: 13 11 12 14   Temp:   98 F (36.7 C) 97.6 F (36.4 C)  TempSrc:   Oral Oral  SpO2:   98% 100%  Weight:  Height:        Wt Readings from Last 3 Encounters:  02/28/24 61.8 kg  12/23/23 67.1 kg  12/08/23 67.1 kg    No intake or output  data in the 24 hours ending 02/29/24 0810   Physical Exam  Awake but pleasantly confused, no new F.N deficits, Normal affect [place].AT,PERRAL Supple Neck, No JVD,   Symmetrical Chest wall movement, Good air movement bilaterally, CTAB RRR,No Gallops,Rubs or new Murmurs,  +ve B.Sounds, Abd Soft, No tenderness,   No Cyanosis, Clubbing or edema        Data Review:    Recent Labs  Lab 02/28/24 0158 02/28/24 0215 02/28/24 0219 02/29/24 0126  WBC 4.6  --   --  6.2  HGB 14.7 18.0* 16.0* 14.8  HCT 44.6 53.0* 47.0* 45.8  PLT 185  --   --  211  MCV 88.8  --   --  90.0  MCH 29.3  --   --  29.1  MCHC 33.0  --   --  32.3  RDW 21.3*  --   --  21.3*  LYMPHSABS 1.0  --   --  1.3  MONOABS 0.2  --   --  0.5  EOSABS 0.2  --   --  0.1  BASOSABS 0.0  --   --  0.0    Recent Labs  Lab 02/28/24 0200 02/28/24 0215 02/28/24 0217 02/28/24 0219 02/28/24 0425 02/29/24 0126  NA  --  143  --  144 144 139  K  --  4.2  --  3.5 3.7 3.8  CL  --  109  --   --  110 105  CO2  --   --   --   --  20* 19*  ANIONGAP  --   --   --   --  14 15  GLUCOSE  --  130*  --   --  124* 166*  BUN  --  23  --   --  17 16  CREATININE  --  0.90  --   --  1.00 0.96  AST  --   --   --   --  37  --   ALT  --   --   --   --  24  --   ALKPHOS  --   --   --   --  116  --   BILITOT  --   --   --   --  1.7*  --   ALBUMIN  --   --   --   --  2.6*  --   INR  --   --   --   --  1.3*  --   BNP 2,195.0*  --   --   --   --   --   MG  --   --  1.8  --   --  1.9  CALCIUM   --   --   --   --  8.8* 8.4*      Recent Labs  Lab 02/28/24 0200 02/28/24 0217 02/28/24 0425 02/29/24 0126  INR  --   --  1.3*  --   BNP 2,195.0*  --   --   --   MG  --  1.8  --  1.9  CALCIUM   --   --  8.8* 8.4*    --------------------------------------------------------------------------------------------------------------- Lab Results  Component Value Date   CHOL 118 04/02/2023   HDL 43 04/02/2023  LDLCALC 60 04/02/2023   TRIG 74 04/02/2023    CHOLHDL 2.7 04/02/2023    Lab Results  Component Value Date   HGBA1C 7.2 (H) 08/28/2023   No results for input(s): TSH, T4TOTAL, FREET4, T3FREE, THYROIDAB in the last 72 hours. No results for input(s): VITAMINB12, FOLATE, FERRITIN, TIBC, IRON, RETICCTPCT in the last 72 hours. ------------------------------------------------------------------------------------------------------------------ Cardiac Enzymes No results for input(s): CKMB, TROPONINI, MYOGLOBIN in the last 168 hours.  Invalid input(s): CK  Micro Results No results found for this or any previous visit (from the past 240 hours).  Radiology Report ECHOCARDIOGRAM COMPLETE Result Date: 02/28/2024    ECHOCARDIOGRAM REPORT   Patient Name:   Staphany Keim Date of Exam: 02/28/2024 Medical Rec #:  [id number]     Height:       65.0 in Accession #:    [id number]    Weight:       147.7 lb Date of Birth:  Nov 01, 1935     BSA:          1.739 m Patient Age:    88 years      BP:           93/67 mmHg Patient Gender: F             HR:           85 bpm. Exam Location:  Inpatient Procedure: 2D Echo and Intracardiac Opacification Agent (Both Spectral and Color            Flow Doppler were utilized during procedure). Indications:    Elevated Troponin  History:        Patient has prior history of Echocardiogram examinations. CHF;                 Signs/Symptoms:Elevated Troponin.  Sonographer:    Norleen Amour Referring Phys: SHONA LAURENCE, N IMPRESSIONS  1. Left ventricular ejection fraction, by estimation, is <20%. The left ventricle has severely decreased function. The left ventricle demonstrates global hypokinesis. There is mild concentric left ventricular hypertrophy. Left ventricular diastolic parameters are indeterminate.  2. Right ventricular systolic function is severely reduced. The right ventricular size is moderately enlarged. There is moderately elevated pulmonary artery systolic pressure. The estimated right  ventricular systolic pressure is 48.2 mmHg.  3. Left atrial size was moderately dilated.  4. Right atrial size was moderately dilated.  5. The mitral valve is normal in structure. Moderate mitral valve regurgitation. No evidence of mitral stenosis.  6. Tricuspid valve regurgitation is severe.  7. The aortic valve is tricuspid. There is mild calcification of the aortic valve. Aortic valve regurgitation is not visualized. No aortic stenosis is present.  8. The inferior vena cava is dilated in size with <50% respiratory variability, suggesting right atrial pressure of 15 mmHg. FINDINGS  Left Ventricle: Left ventricular ejection fraction, by estimation, is <20%. The left ventricle has severely decreased function. The left ventricle demonstrates global hypokinesis. The left ventricular internal cavity size was normal in size. There is mild concentric left ventricular hypertrophy. Left ventricular diastolic parameters are indeterminate. Right Ventricle: The right ventricular size is moderately enlarged. No increase in right ventricular wall thickness. Right ventricular systolic function is severely reduced. There is moderately elevated pulmonary artery systolic pressure. The tricuspid regurgitant velocity is 2.88 m/s, and with an assumed right atrial pressure of 15 mmHg, the estimated right ventricular systolic pressure is 48.2 mmHg. Left Atrium: Left atrial size was moderately dilated. Right Atrium: Right atrial size was moderately dilated. Pericardium: There is no  evidence of pericardial effusion. Mitral Valve: The mitral valve is normal in structure. Moderate mitral valve regurgitation. No evidence of mitral valve stenosis. Tricuspid Valve: The tricuspid valve is normal in structure. Tricuspid valve regurgitation is severe. No evidence of tricuspid stenosis. Aortic Valve: The aortic valve is tricuspid. There is mild calcification of the aortic valve. Aortic valve regurgitation is not visualized. No aortic stenosis is  present. Pulmonic Valve: The pulmonic valve was normal in structure. Pulmonic valve regurgitation is not visualized. No evidence of pulmonic stenosis. Aorta: The aortic root is normal in size and structure. Venous: The inferior vena cava is dilated in size with less than 50% respiratory variability, suggesting right atrial pressure of 15 mmHg. IAS/Shunts: No atrial level shunt detected by color flow Doppler.  LEFT VENTRICLE PLAX 2D LVIDd:         4.10 cm      Diastology LVIDs:         3.90 cm      LV e' medial:    2.70 cm/s LV PW:         1.10 cm      LV E/e' medial:  24.6 LV IVS:        1.30 cm      LV e' lateral:   3.49 cm/s LVOT diam:     1.50 cm      LV E/e' lateral: 19.1 LV SV:         11 LV SV Index:   6 LVOT Area:     1.77 cm  LV Volumes (MOD) LV vol d, MOD A2C: 100.0 ml LV vol d, MOD A4C: 85.7 ml LV vol s, MOD A2C: 94.6 ml LV vol s, MOD A4C: 44.7 ml LV SV MOD A2C:     5.4 ml LV SV MOD A4C:     85.7 ml LV SV MOD BP:      28.2 ml RIGHT VENTRICLE            IVC RV Basal diam:  4.40 cm    IVC diam: 1.90 cm RV S prime:     6.47 cm/s TAPSE (M-mode): 1.8 cm     PULMONARY VEINS                            Diastolic Velocity: 36.80 cm/s                            S/D Velocity:       0.50                            Systolic Velocity:  17.60 cm/s LEFT ATRIUM           Index        RIGHT ATRIUM           Index LA diam:      3.50 cm 2.01 cm/m   RA Area:     16.10 cm LA Vol (A2C): 32.8 ml 18.86 ml/m  RA Volume:   45.20 ml  25.99 ml/m LA Vol (A4C): 64.6 ml 37.15 ml/m  AORTIC VALVE             PULMONIC VALVE LVOT Vmax:   44.20 cm/s  PV Vmax:       0.57 m/s LVOT Vmean:  30.500 cm/s PV Peak grad:  1.3 mmHg LVOT VTI:  0.062 m  AORTA Ao Root diam: 2.50 cm Ao Asc diam:  2.90 cm MITRAL VALVE               TRICUSPID VALVE MV Area (PHT): 3.36 cm    TR Peak grad:   33.2 mmHg MV Decel Time: 226 msec    TR Vmax:        288.00 cm/s MV E velocity: 66.50 cm/s MV A velocity: 16.20 cm/s  SHUNTS MV E/A ratio:  4.10        Systemic  VTI:  0.06 m                            Systemic Diam: 1.50 cm Toribio Fuel MD Electronically signed by Toribio Fuel MD Signature Date/Time: 02/28/2024/8:37:00 PM    Final    DG Chest Port 1 View Result Date: 02/28/2024 EXAM: 1 VIEW(S) XRAY OF THE CHEST 02/28/2024 03:12:00 AM COMPARISON: None available. CLINICAL HISTORY: hypoxia hypoxia FINDINGS: LUNGS AND PLEURA: Left lower lung opacities. Chronic coarsened interstitial markings. No pleural effusion. No pneumothorax. HEART AND MEDIASTINUM: Similar enlarged cardiomediastinal silhouette.  Aortic atherosclerosis. BONES AND SOFT TISSUES: No acute osseous abnormality. IMPRESSION: 1. Left lower lung opacities, which could represent atelectasis or pneumonia. Electronically signed by: Gilmore Molt MD 02/28/2024 03:56 AM EST RP Workstation: [id number]     Signature  -   Lavada Stank M.D on 02/29/2024 at 8:10 AM   -  To page go to [email or web address]

## 2024-02-29 NOTE — Evaluation (Signed)
 Occupational Therapy Evaluation Patient Details Name: [name] [name] MRN: [id number] DOB: 09/25/1935 Today's Date: 02/29/2024   History of Present Illness   88 y.o. female who presents to the ER from [facility] 02/28/24 initially as a code stroke which was then cancelled. +pna; elevated troponin with suspected demand ischemia; CHF PMH-significant for chronic combined diastolic and systolic CHF, moderate to severe pulmonary hypertension, essential hypertension, type 2 diabetes, peripheral artery disease, coronary artery disease, chronic hypoxia on 2 L nasal cannula continuously, CKD 3B, dementia.     Clinical Impressions Patient admitted for the diagnosis above.  PTA it appears she was residing at a local SNF, unsure if she was a long term resident, or undergoing rehab.  Patient resistant to EOB and transfers to recliner.  Eventual repositioned higher in bed, able to wash face and handed her banana for breakfast.  OT can try a few visits to encourage OOB for meals and seated grooming.  Recommend back to SNF for level of care she was receiving.        If plan is discharge home, recommend the following:   A lot of help with bathing/dressing/bathroom;Two people to help with walking and/or transfers     Functional Status Assessment   Patient has had a recent decline in their functional status and demonstrates the ability to make significant improvements in function in a reasonable and predictable amount of time.     Equipment Recommendations   None recommended by OT     Recommendations for Other Services         Precautions/Restrictions   Precautions Precautions: Fall Recall of Precautions/Restrictions: Impaired Restrictions Weight Bearing Restrictions Per Provider Order: No     Mobility Bed Mobility Overal bed mobility: Needs Assistance Bed Mobility: Supine to Sit     Supine to sit: Max assist     General bed mobility comments: Started to sit up, but stopped and laid  back down... refusing OOB    Transfers                   General transfer comment: unable      Balance                                           ADL either performed or assessed with clinical judgement   ADL   Eating/Feeding: Supervision/ safety;Bed level   Grooming: Supervision/safety;Bed level                   Toilet Transfer: Moderate assistance;BSC/3in1;+2 for physical assistance                   Vision   Vision Assessment?: No apparent visual deficits     Perception Perception: Not tested       Praxis Praxis: Not tested       Pertinent Vitals/Pain Pain Assessment Pain Assessment: Faces Faces Pain Scale: Hurts little more Pain Location: Generalized with AROM to B LE and UE Pain Descriptors / Indicators: Grimacing Pain Intervention(s): Monitored during session     Extremity/Trunk Assessment Upper Extremity Assessment Upper Extremity Assessment: Generalized weakness;Right hand dominant;RUE deficits/detail;LUE deficits/detail RUE Deficits / Details: limited shoulder AROM RUE Sensation: WNL RUE Coordination: WNL LUE Deficits / Details: limited shoulder AROM LUE Sensation: WNL LUE Coordination: WNL   Lower Extremity Assessment Lower Extremity Assessment: Defer to PT evaluation   Cervical / Trunk Assessment Cervical /  Trunk Assessment: Kyphotic   Communication Communication Communication: No apparent difficulties   Cognition Arousal: Alert Behavior During Therapy: Flat affect Cognition: History of cognitive impairments             OT - Cognition Comments: Oriented to self, follows one step with repetition and tactile cues.                 Following commands: Impaired Following commands impaired: Follows one step commands inconsistently, Follows one step commands with increased time     Cueing  General Comments   Cueing Techniques: Verbal cues;Gestural cues;Tactile cues   VSS on RA    Exercises     Shoulder Instructions      Home Living Family/patient expects to be discharged to:: Skilled nursing facility                                        Prior Functioning/Environment                 ADLs Comments: Assist as needed via SNF staff.  Assume Max A for ADL completion at seated level.    OT Problem List: Pain;Decreased strength;Decreased range of motion;Decreased activity tolerance;Impaired balance (sitting and/or standing);Decreased safety awareness;Decreased cognition   OT Treatment/Interventions: Self-care/ADL training;Therapeutic activities;Balance training;DME and/or AE instruction      OT Goals(Current goals can be found in the care plan section)   Acute Rehab OT Goals OT Goal Formulation: Patient unable to participate in goal setting Time For Goal Achievement: 03/14/24 Potential to Achieve Goals: Fair ADL Goals Pt Will Perform Eating: with set-up;sitting Pt Will Perform Grooming: with set-up;sitting Pt Will Transfer to Toilet: with mod assist;stand pivot transfer;bedside commode   OT Frequency:  Min 1X/week    Co-evaluation              AM-PAC OT 6 Clicks Daily Activity     Outcome Measure Help from another person eating meals?: A Little Help from another person taking care of personal grooming?: A Little Help from another person toileting, which includes using toliet, bedpan, or urinal?: A Lot Help from another person bathing (including washing, rinsing, drying)?: A Lot Help from another person to put on and taking off regular upper body clothing?: A Lot Help from another person to put on and taking off regular lower body clothing?: A Lot 6 Click Score: 14   End of Session Nurse Communication: Mobility status  Activity Tolerance: Other (comment) (Limited by cognition) Patient left:    OT Visit Diagnosis: Pain                Time: 9179-9161 OT Time Calculation (min): 18 min Charges:  OT General  Charges $OT Visit: 1 Visit OT Evaluation $OT Eval Moderate Complexity: 1 Mod  02/29/2024  RP, OTR/L  Acute Rehabilitation Services  Office:  [phone]   Charlie JONETTA Halsted 02/29/2024, 8:46 AM

## 2024-02-29 NOTE — TOC Progression Note (Addendum)
 Transition of Care [facility]) - Progression Note    Patient Details  Name: [name] [name] MRN: [id number] Date of Birth: [birth year]  Transition of Care [facility]) CM/SW Contact  Inocente GORMAN Kindle, LCSW Phone Number: 02/29/2024, 2:54 PM  Clinical Narrative:    CSW received return call from patient's daughter, Sharman, and provided update. Patient will require PTAR for transport.   CSW received call from [place] with [facility] [phone]) stating that patient is active with their Hospice Services at [facility]. CSW updated MD.    Expected Discharge Plan: Skilled Nursing Facility Barriers to Discharge: Continued Medical Work up               Expected Discharge Plan and Services In-house Referral: Clinical Social Work   Post Acute Care Choice: Skilled Nursing Facility Living arrangements for the past 2 months: Skilled Nursing Facility                                       Social Drivers of Health (SDOH) Interventions SDOH Screenings   Food Insecurity: No Food Insecurity (08/21/2023)  Housing: Low Risk  (08/21/2023)  Transportation Needs: No Transportation Needs (08/21/2023)  Utilities: Not At Risk (08/21/2023)  Financial Resource Strain: Low Risk  (02/21/2023)   Received from [facility]  Social Connections: Socially Isolated (08/21/2023)  Tobacco Use: Medium Risk (12/08/2023)    Readmission Risk Interventions    02/29/2024    2:53 PM 08/22/2023    2:18 PM  Readmission Risk Prevention Plan  Transportation Screening Complete Complete  Medication Review (RN Care Manager) Complete Complete  PCP or Specialist appointment within 3-5 days of discharge Complete Complete  HRI or Home Care Consult Complete Complete  SW Recovery Care/Counseling Consult Complete Complete  Palliative Care Screening Not Applicable Not Applicable  Skilled Nursing Facility Complete Not Complete  SNF Comments  Will complete tomorrow if she will meet 3-night inpatient  stay.

## 2024-02-29 NOTE — Progress Notes (Signed)
 PT Cancellation Note  Patient Details Name: [name] [name] MRN: [id number] DOB: 1936-01-04   Cancelled Treatment:    Reason Eval/Treat Not Completed: Patient declined, no reason specified  Get out, get out, get out! Pt repeating multiple times and then swearing at therapist.   ?if pt is in long-term care or truly skilled nursing? PT will make one more attempt at evaluation 11/21, however if pt non-participatory will sign-off.    Macario RAMAN, PT Acute Rehabilitation Services  Office [phone]  [name] 02/29/2024, 9:53 AM

## 2024-03-01 DIAGNOSIS — J189 Pneumonia, unspecified organism: Secondary | ICD-10-CM | POA: Diagnosis not present

## 2024-03-01 LAB — CBC WITH DIFFERENTIAL/PLATELET
Abs Immature Granulocytes: 0.02 K/uL (ref 0.00–0.07)
Basophils Absolute: 0 K/uL (ref 0.0–0.1)
Basophils Relative: 1 %
Eosinophils Absolute: 0.1 K/uL (ref 0.0–0.5)
Eosinophils Relative: 2 %
HCT: 45.1 % (ref 36.0–46.0)
Hemoglobin: 14.8 g/dL (ref 12.0–15.0)
Immature Granulocytes: 0 %
Lymphocytes Relative: 23 %
Lymphs Abs: 1.1 K/uL (ref 0.7–4.0)
MCH: 29.2 pg (ref 26.0–34.0)
MCHC: 32.8 g/dL (ref 30.0–36.0)
MCV: 89.1 fL (ref 80.0–100.0)
Monocytes Absolute: 0.3 K/uL (ref 0.1–1.0)
Monocytes Relative: 6 %
Neutro Abs: 3.2 K/uL (ref 1.7–7.7)
Neutrophils Relative %: 68 %
Platelets: 204 K/uL (ref 150–400)
RBC: 5.06 MIL/uL (ref 3.87–5.11)
RDW: 21.9 % — ABNORMAL HIGH (ref 11.5–15.5)
Smear Review: NORMAL
WBC: 4.7 K/uL (ref 4.0–10.5)
nRBC: 0 % (ref 0.0–0.2)

## 2024-03-01 LAB — BASIC METABOLIC PANEL WITH GFR
Anion gap: 13 (ref 5–15)
BUN: 15 mg/dL (ref 8–23)
CO2: 22 mmol/L (ref 22–32)
Calcium: 8.5 mg/dL — ABNORMAL LOW (ref 8.9–10.3)
Chloride: 108 mmol/L (ref 98–111)
Creatinine, Ser: 0.97 mg/dL (ref 0.44–1.00)
GFR, Estimated: 57 mL/min — ABNORMAL LOW (ref >60)
Glucose, Bld: 131 mg/dL — ABNORMAL HIGH (ref 70–99)
Potassium: 3.8 mmol/L (ref 3.5–5.1)
Sodium: 143 mmol/L (ref 135–145)

## 2024-03-01 LAB — PROCALCITONIN: Procalcitonin: <0.1 ng/mL

## 2024-03-01 LAB — BRAIN NATRIURETIC PEPTIDE: B Natriuretic Peptide: 1627.8 pg/mL — ABNORMAL HIGH (ref 0.0–100.0)

## 2024-03-01 LAB — MAGNESIUM: Magnesium: 1.7 mg/dL (ref 1.7–2.4)

## 2024-03-01 LAB — C-REACTIVE PROTEIN: CRP: 1.5 mg/dL — ABNORMAL HIGH (ref <1.0)

## 2024-03-01 MED ORDER — MAGNESIUM SULFATE 2 GM/50ML IV SOLN
2.0000 g | Freq: Once | INTRAVENOUS | Status: AC
  Administered 2024-03-01: 2 g via INTRAVENOUS
  Filled 2024-03-01: qty 50

## 2024-03-01 MED ORDER — POTASSIUM CHLORIDE CRYS ER 20 MEQ PO TBCR
40.0000 meq | EXTENDED_RELEASE_TABLET | Freq: Once | ORAL | Status: AC
  Administered 2024-03-01: 40 meq via ORAL
  Filled 2024-03-01: qty 2

## 2024-03-01 MED ORDER — AZITHROMYCIN 500 MG PO TABS
500.0000 mg | ORAL_TABLET | Freq: Every day | ORAL | Status: DC
  Administered 2024-03-01: 500 mg via ORAL
  Filled 2024-03-01: qty 1

## 2024-03-01 MED ORDER — FUROSEMIDE 10 MG/ML IJ SOLN
40.0000 mg | Freq: Once | INTRAMUSCULAR | Status: AC
  Administered 2024-03-01: 40 mg via INTRAVENOUS
  Filled 2024-03-01: qty 4

## 2024-03-01 NOTE — Progress Notes (Signed)
 PROGRESS NOTE     Patient Demographics:    [name] [name], is a 88 y.o. female, DOB - 1936/02/10, FMW:[id number]  Outpatient Primary [facility] the [facility] is [facility]    LOS - 2  Admit date - 02/28/2024    Chief Complaint  Patient presents with   Code Stroke       Brief Narrative (HPI from H&P)   88 y.o. female with medical history significant for chronic combined diastolic and systolic CHF, moderate to severe pulmonary hypertension, essential hypertension, type 2 diabetes, peripheral artery disease, coronary artery disease, chronic hypoxia on 2 L nasal cannula continuously, CKD 3B, dementia, who presents to the ER from SNF initially as a code stroke.  Reportedly the patient was hypoxic and had left-sided deviation.   Upon arrival to the ER, the patient was evaluated by stroke team and was found to have no focal deficits.  Code stroke was canceled by neurologist.  Further workup revealed chest x-ray showing left lower lobe opacities concerning for pneumonia.  She was admitted for pneumonia treatment.   Subjective:   Patient in bed pleasantly confused but in no distress, denies any headache chest or abdominal pain.   Assessment  & Plan :   Community-acquired pneumonia, POA  Continue Rocephin  and IV azithromycin  As needed bronchodilators As needed antitussives, clinically improved advance activity and titrate down oxygen, encouraged to sit in chair use I-S and flutter valve for pulmonary toiletry.   Elevated troponin, suspect demand ischemia in the setting of pneumonia, underlying history of chronic combined systolic and diastolic CHF with EF around 20%.  High-sensitivity troponin peaked at 310 and  downtrended, 25, no chest pain, trend is not consistent with ACS, on aspirin  and statin, monitor telemetry.  Outpatient cardiology follow-up.   Mild acute chronic combined diastolic and systolic CHF EF 20% - Asa, statin, farxiga  held as blood pressure dropping low, received one-time Lasix  on 02/29/2024, currently stable, baseline low blood pressures.  No crackles or shortness of breath currently.    Moderate to severe pulmonary hypertension - No acute issues.  Follow-up with cardiology outpatient.  Abnormal UA: concern for UTI, no dysuria monitor.  Already on antibiotics for #1 above   Coronary artery disease Peripheral artery disease  Resumed home regimen.   CKD 3B  Able continue to monitor   Chronic hypoxia, on 2 L nasal cannula continuously Wean off O2 supplementation as tolerated Maintain O2 saturation above 90%.   Generalized weakness  PT OT evaluation Ambulates with a walker Fall precautions.   Dementia Reorient as needed.  Risk for hospital-acquired delirium, minimize narcotics and benzodiazepines, exposed to sunlight. Bed bound.   Code stroke, canceled Code stroke was called by EMS due to reported left-sided deviation No focal deficits on exam-no need to pursue TIA/stroke workup at this time, per neurology.      Condition - Extremely Guarded  Family Communication  :  daughter [phone] called 02/29/2024 at 8:10 AM and message left, updated again on 02/29/24  Code Status : Full code  Consults  : Neurology  PUD Prophylaxis :     Procedures  :     Echocardiogram - 1. Left ventricular ejection fraction, by estimation, is <20%. The left ventricle has severely decreased function. The left ventricle demonstrates global hypokinesis. There is mild concentric left ventricular hypertrophy. Left ventricular diastolic parameters are indeterminate.  2. Right ventricular systolic function is severely reduced. The right ventricular size is moderately enlarged. There is moderately  elevated pulmonary artery systolic pressure. The estimated right ventricular systolic pressure is 48.2 mmHg.  3. Left atrial size was moderately dilated.  4. Right atrial size was moderately dilated.  5. The mitral valve is normal in structure. Moderate mitral valve regurgitation. No evidence of mitral stenosis.  6. Tricuspid valve regurgitation is severe.  7. The aortic valve is tricuspid. There is mild calcification of the aortic valve. Aortic valve regurgitation is not visualized. No aortic stenosis is present.  8. The inferior vena cava is dilated in size with <50% respiratory variability, suggesting right atrial pressure of 15 mmHg.      Disposition Plan  :    Status is: Inpatient   DVT Prophylaxis  :        Lab Results  Component Value Date   PLT 204 03/01/2024    Diet :  Diet Order             DIET DYS 3 Room service appropriate? Yes; Fluid consistency: Thin  Diet effective now                    Inpatient Medications  Scheduled Meds:  aspirin   81 mg Oral Daily   atorvastatin   40 mg Oral Daily   brexpiprazole   2 mg Oral Daily   busPIRone   7.5 mg Oral BID   dapagliflozin  propanediol  10 mg Oral Daily   memantine   20 mg Oral BID   Continuous Infusions:  azithromycin  (ZITHROMAX ) 500 mg in sodium chloride  0.9 % 250 mL IVPB 500 mg (02/29/24 2208)   cefTRIAXone  (ROCEPHIN )  IV 2 g (02/29/24 2215)   PRN Meds:.acetaminophen , guaiFENesin -dextromethorphan, haloperidol  lactate, ipratropium-albuterol , LORazepam , melatonin, polyethylene glycol    Objective:   Vitals:   03/01/24 0000 03/01/24 0400 03/01/24 0722 03/01/24 0729  BP: (!) 86/64  (!) 102/90   Pulse: 67 80 (!) 199 (!) 162  Resp: (!) 0  14 (!) 9  Temp: 97.7 F (36.5 C) (!) 97.5 F (36.4 C) 97.7 F (36.5 C) 97.9 F (36.6 C)  TempSrc: Axillary Axillary Axillary Axillary  SpO2:  97% (!) 74% (!) 77%  Weight:  65.6 kg    Height:        Wt Readings from Last 3 Encounters:  03/01/24 65.6 kg  12/23/23  67.1 kg  12/08/23 67.1 kg     Intake/Output Summary (Last 24 hours) at 03/01/2024 0834 Last data filed at 03/01/2024 0448 Gross per 24 hour  Intake --  Output 1000 ml  Net -1000 ml     Physical Exam  Awake but pleasantly confused, no new F.N deficits, Normal affect Rembrandt.AT,PERRAL Supple Neck, No JVD,   Symmetrical Chest wall movement, Good air movement bilaterally, CTAB RRR,No Gallops,Rubs or new Murmurs,  +ve B.Sounds, Abd Soft, No tenderness,   No Cyanosis, Clubbing or edema        Data Review:    Recent Labs  Lab 02/28/24 0158 02/28/24 0215 02/28/24 0219 02/29/24 0126 03/01/24 0313  WBC 4.6  --   --  6.2 4.7  HGB 14.7 18.0* 16.0* 14.8 14.8  HCT 44.6 53.0* 47.0* 45.8 45.1  PLT 185  --   --  211 204  MCV 88.8  --   --  90.0 89.1  MCH 29.3  --   --  29.1 29.2  MCHC 33.0  --   --  32.3 32.8  RDW 21.3*  --   --  21.3* 21.9*  LYMPHSABS 1.0  --   --  1.3 1.1  MONOABS 0.2  --   --  0.5 0.3  EOSABS 0.2  --   --  0.1 0.1  BASOSABS 0.0  --   --  0.0 0.0    Recent Labs  Lab 02/28/24 0200 02/28/24 0215 02/28/24 0217 02/28/24 0219 02/28/24 0307 02/28/24 0425 02/29/24 0126 03/01/24 0313  NA  --  143  --  144  --  144 139 143  K  --  4.2  --  3.5  --  3.7 3.8 3.8  CL  --  109  --   --   --  110 105 108  CO2  --   --   --   --   --  20* 19* 22  ANIONGAP  --   --   --   --   --  14 15 13   GLUCOSE  --  130*  --   --   --  124* 166* 131*  BUN  --  23  --   --   --  17 16 15   CREATININE  --  0.90  --   --   --  1.00 0.96 0.97  AST  --   --   --   --   --  37  --   --   ALT  --   --   --   --   --  24  --   --   ALKPHOS  --   --   --   --   --  116  --   --   BILITOT  --   --   --   --   --  1.7*  --   --   ALBUMIN  --   --   --   --   --  2.6*  --   --   CRP  --   --   --   --  1.3*  --   --  1.5*  PROCALCITON  --   --   --   --   --   --  <0.10 <0.10  INR  --   --   --   --   --  1.3*  --   --   BNP 2,195.0*  --   --   --   --   --   --  1,627.8*  MG  --   --   1.8  --   --   --  1.9 1.7  CALCIUM   --   --   --   --   --  8.8* 8.4* 8.5*      Recent Labs  Lab 02/28/24 0200 02/28/24 0217 02/28/24 0307 02/28/24 0425 02/29/24 0126 03/01/24 0313  CRP  --   --  1.3*  --   --  1.5*  PROCALCITON  --   --   --   --  <0.10 <0.10  INR  --   --   --  1.3*  --   --   BNP 2,195.0*  --   --   --   --  1,627.8*  MG  --  1.8  --   --  1.9 1.7  CALCIUM   --   --   --  8.8* 8.4* 8.5*    --------------------------------------------------------------------------------------------------------------- Lab Results  Component Value Date   CHOL 118 04/02/2023   HDL 43 04/02/2023   LDLCALC 60 04/02/2023   TRIG 74 04/02/2023   CHOLHDL 2.7 04/02/2023    Lab Results  Component Value Date   HGBA1C 7.2 (H) 08/28/2023   No results for input(s): TSH, T4TOTAL, FREET4, T3FREE, THYROIDAB in the last 72 hours. No results for input(s): VITAMINB12, FOLATE, FERRITIN, TIBC, IRON, RETICCTPCT in the last 72 hours. ------------------------------------------------------------------------------------------------------------------ Cardiac Enzymes No results for input(s): CKMB, TROPONINI, MYOGLOBIN in the last 168 hours.  Invalid input(s): CK  Micro Results No results found for this or any previous visit (from the past 240 hours).  Radiology Report ECHOCARDIOGRAM COMPLETE Result Date: 02/28/2024    ECHOCARDIOGRAM REPORT   Patient Name:   [name] [name] Date of Exam: 02/28/2024 Medical Rec #:  [id number]     Height:       65.0 in Accession #:    [id number]    Weight:       147.7 lb Date of Birth:  25-Jul-1935     BSA:          1.739 m Patient Age:    88 years      BP:           93/67 mmHg Patient Gender: F             HR:           85 bpm. Exam Location:  Inpatient Procedure: 2D Echo and Intracardiac Opacification Agent (Both Spectral and Color            Flow Doppler were utilized during procedure). Indications:    Elevated Troponin  History:         Patient has prior history of Echocardiogram examinations. CHF;                 Signs/Symptoms:Elevated Troponin.  Sonographer:    Norleen Amour Referring Phys: SHONA LAURENCE, N IMPRESSIONS  1. Left ventricular ejection fraction, by estimation, is <20%. The left ventricle has severely decreased function. The left ventricle demonstrates global hypokinesis. There is mild concentric left ventricular hypertrophy. Left ventricular diastolic parameters are indeterminate.  2. Right ventricular systolic function is severely reduced. The right ventricular size is moderately enlarged. There is moderately elevated pulmonary artery systolic pressure. The estimated right ventricular systolic pressure is 48.2 mmHg.  3. Left atrial size was moderately  dilated.  4. Right atrial size was moderately dilated.  5. The mitral valve is normal in structure. Moderate mitral valve regurgitation. No evidence of mitral stenosis.  6. Tricuspid valve regurgitation is severe.  7. The aortic valve is tricuspid. There is mild calcification of the aortic valve. Aortic valve regurgitation is not visualized. No aortic stenosis is present.  8. The inferior vena cava is dilated in size with <50% respiratory variability, suggesting right atrial pressure of 15 mmHg. FINDINGS  Left Ventricle: Left ventricular ejection fraction, by estimation, is <20%. The left ventricle has severely decreased function. The left ventricle demonstrates global hypokinesis. The left ventricular internal cavity size was normal in size. There is mild concentric left ventricular hypertrophy. Left ventricular diastolic parameters are indeterminate. Right Ventricle: The right ventricular size is moderately enlarged. No increase in right ventricular wall thickness. Right ventricular systolic function is severely reduced. There is moderately elevated pulmonary artery systolic pressure. The tricuspid regurgitant velocity is 2.88 m/s, and with an assumed right atrial pressure of 15  mmHg, the estimated right ventricular systolic pressure is 48.2 mmHg. Left Atrium: Left atrial size was moderately dilated. Right Atrium: Right atrial size was moderately dilated. Pericardium: There is no evidence of pericardial effusion. Mitral Valve: The mitral valve is normal in structure. Moderate mitral valve regurgitation. No evidence of mitral valve stenosis. Tricuspid Valve: The tricuspid valve is normal in structure. Tricuspid valve regurgitation is severe. No evidence of tricuspid stenosis. Aortic Valve: The aortic valve is tricuspid. There is mild calcification of the aortic valve. Aortic valve regurgitation is not visualized. No aortic stenosis is present. Pulmonic Valve: The pulmonic valve was normal in structure. Pulmonic valve regurgitation is not visualized. No evidence of pulmonic stenosis. Aorta: The aortic root is normal in size and structure. Venous: The inferior vena cava is dilated in size with less than 50% respiratory variability, suggesting right atrial pressure of 15 mmHg. IAS/Shunts: No atrial level shunt detected by color flow Doppler.  LEFT VENTRICLE PLAX 2D LVIDd:         4.10 cm      Diastology LVIDs:         3.90 cm      LV e' medial:    2.70 cm/s LV PW:         1.10 cm      LV E/e' medial:  24.6 LV IVS:        1.30 cm      LV e' lateral:   3.49 cm/s LVOT diam:     1.50 cm      LV E/e' lateral: 19.1 LV SV:         11 LV SV Index:   6 LVOT Area:     1.77 cm  LV Volumes (MOD) LV vol d, MOD A2C: 100.0 ml LV vol d, MOD A4C: 85.7 ml LV vol s, MOD A2C: 94.6 ml LV vol s, MOD A4C: 44.7 ml LV SV MOD A2C:     5.4 ml LV SV MOD A4C:     85.7 ml LV SV MOD BP:      28.2 ml RIGHT VENTRICLE            IVC RV Basal diam:  4.40 cm    IVC diam: 1.90 cm RV S prime:     6.47 cm/s TAPSE (M-mode): 1.8 cm     PULMONARY VEINS  Diastolic Velocity: 36.80 cm/s                            S/D Velocity:       0.50                            Systolic Velocity:  17.60 cm/s LEFT ATRIUM            Index        RIGHT ATRIUM           Index LA diam:      3.50 cm 2.01 cm/m   RA Area:     16.10 cm LA Vol (A2C): 32.8 ml 18.86 ml/m  RA Volume:   45.20 ml  25.99 ml/m LA Vol (A4C): 64.6 ml 37.15 ml/m  AORTIC VALVE             PULMONIC VALVE LVOT Vmax:   44.20 cm/s  PV Vmax:       0.57 m/s LVOT Vmean:  30.500 cm/s PV Peak grad:  1.3 mmHg LVOT VTI:    0.062 m  AORTA Ao Root diam: 2.50 cm Ao Asc diam:  2.90 cm MITRAL VALVE               TRICUSPID VALVE MV Area (PHT): 3.36 cm    TR Peak grad:   33.2 mmHg MV Decel Time: 226 msec    TR Vmax:        288.00 cm/s MV E velocity: 66.50 cm/s MV A velocity: 16.20 cm/s  SHUNTS MV E/A ratio:  4.10        Systemic VTI:  0.06 m                            Systemic Diam: 1.50 cm Toribio Fuel MD Electronically signed by Toribio Fuel MD Signature Date/Time: 02/28/2024/8:37:00 PM    Final      Signature  -   Lavada Stank M.D on 03/01/2024 at 8:34 AM   -  To page go to [email or web address]

## 2024-03-01 NOTE — Plan of Care (Signed)
  Problem: Education: Goal: Knowledge of General Education information will improve Description: Including pain rating scale, medication(s)/side effects and non-pharmacologic comfort measures 03/01/2024 1945 by Hanley Clem KIDD, RN Outcome: Progressing 03/01/2024 1944 by Hanley Clem KIDD, RN Outcome: Progressing   Problem: Health Behavior/Discharge Planning: Goal: Ability to manage health-related needs will improve 03/01/2024 1945 by Hanley Clem KIDD, RN Outcome: Progressing 03/01/2024 1944 by Hanley Clem KIDD, RN Outcome: Progressing   Problem: Clinical Measurements: Goal: Ability to maintain clinical measurements within normal limits will improve 03/01/2024 1945 by Hanley Clem KIDD, RN Outcome: Progressing 03/01/2024 1944 by Hanley Clem KIDD, RN Outcome: Progressing Goal: Will remain free from infection 03/01/2024 1945 by Hanley Clem KIDD, RN Outcome: Progressing 03/01/2024 1944 by Hanley Clem KIDD, RN Outcome: Progressing Goal: Diagnostic test results will improve 03/01/2024 1945 by Hanley Clem KIDD, RN Outcome: Progressing 03/01/2024 1944 by Hanley Clem KIDD, RN Outcome: Progressing Goal: Respiratory complications will improve 03/01/2024 1945 by Hanley Clem KIDD, RN Outcome: Progressing 03/01/2024 1944 by Hanley Clem KIDD, RN Outcome: Progressing Goal: Cardiovascular complication will be avoided 03/01/2024 1945 by Hanley Clem KIDD, RN Outcome: Progressing 03/01/2024 1944 by Hanley Clem KIDD, RN Outcome: Progressing   Problem: Activity: Goal: Risk for activity intolerance will decrease 03/01/2024 1945 by Hanley Clem KIDD, RN Outcome: Progressing 03/01/2024 1944 by Hanley Clem KIDD, RN Outcome: Progressing   Problem: Nutrition: Goal: Adequate nutrition will be maintained 03/01/2024 1945 by Hanley Clem KIDD, RN Outcome: Progressing 03/01/2024 1944 by Hanley Clem KIDD, RN Outcome: Progressing   Problem: Coping: Goal: Level of anxiety will decrease 03/01/2024 1945 by Hanley Clem KIDD, RN Outcome: Progressing 03/01/2024 1944 by Hanley Clem KIDD, RN Outcome: Progressing   Problem: Elimination: Goal: Will not experience complications related to bowel motility 03/01/2024 1945 by Hanley Clem KIDD, RN Outcome: Progressing 03/01/2024 1944 by Hanley Clem KIDD, RN Outcome: Progressing Goal: Will not experience complications related to urinary retention 03/01/2024 1945 by Hanley Clem KIDD, RN Outcome: Progressing 03/01/2024 1944 by Hanley Clem KIDD, RN Outcome: Progressing   Problem: Pain Managment: Goal: General experience of comfort will improve and/or be controlled 03/01/2024 1945 by Hanley Clem KIDD, RN Outcome: Progressing 03/01/2024 1944 by Hanley Clem KIDD, RN Outcome: Progressing   Problem: Safety: Goal: Ability to remain free from injury will improve 03/01/2024 1945 by Hanley Clem KIDD, RN Outcome: Progressing 03/01/2024 1944 by Hanley Clem KIDD, RN Outcome: Progressing   Problem: Skin Integrity: Goal: Risk for impaired skin integrity will decrease 03/01/2024 1945 by Hanley Clem KIDD, RN Outcome: Progressing 03/01/2024 1944 by Hanley Clem KIDD, RN Outcome: Progressing

## 2024-03-01 NOTE — TOC Progression Note (Signed)
 Transition of Care [facility]) - Progression Note    Patient Details  Name: [name] [name] MRN: [id number] Date of Birth: April 03, 1936  [facility] CM/SW Contact  Bernardino Dean, [place] Phone Number: 03/01/2024, 11:07 AM  Clinical Narrative:    Per attending, patient is targeted for discharge tomorrow, 11/22. Discharge plan remains for patient to return to LTC at [facility] and resume hospice. SW spoke with patient's daughter, [name] [name], who is understanding of and in agreement with this plan. SW called Therisa [phone]), admissions coordinator for [facility]. She confirmed that the patient could return tomorrow. She states that she will be available for coordination of weekend admissions and recommended calling the above number if needed. She stated report can be called to the main number [phone]) and she shared no transport constraints when asked. The patient will require ambulance transport. The patient's room# is 203B. The CMN was completed previously.    Expected Discharge Plan: Skilled Nursing Facility Barriers to Discharge: Continued Medical Work up               Expected Discharge Plan and Services In-house Referral: Clinical Social Work   Post Acute Care Choice: Skilled Nursing Facility Living arrangements for the past 2 months: Skilled Nursing Facility                                       Social Drivers of Health (SDOH) Interventions SDOH Screenings   Food Insecurity: No Food Insecurity (08/21/2023)  Housing: Low Risk  (08/21/2023)  Transportation Needs: No Transportation Needs (08/21/2023)  Utilities: Not At Risk (08/21/2023)  Financial Resource Strain: Low Risk  (02/21/2023)   Received from [facility]  Social Connections: Socially Isolated (08/21/2023)  Tobacco Use: Medium Risk (12/08/2023)    Readmission Risk Interventions    02/29/2024    2:53 PM 08/22/2023    2:18 PM  Readmission Risk Prevention Plan   Transportation Screening Complete Complete  Medication Review (RN Care Manager) Complete Complete  PCP or Specialist appointment within 3-5 days of discharge Complete Complete  HRI or Home Care Consult Complete Complete  SW Recovery Care/Counseling Consult Complete Complete  Palliative Care Screening Not Applicable Not Applicable  Skilled Nursing Facility Complete Not Complete  SNF Comments  Will complete tomorrow if she will meet 3-night inpatient stay.

## 2024-03-01 NOTE — Progress Notes (Signed)
 PT Cancellation Note  Patient Details Name: [name] [name] MRN: [id number] DOB: [birth year]   Cancelled Treatment:    Reason Eval/Treat Not Completed: Patient at procedure or test/unavailable (Pt declined therapy again. Will sign off at this time.)   [name] 03/01/2024, 10:16 AM

## 2024-03-01 NOTE — Plan of Care (Signed)

## 2024-03-01 NOTE — Progress Notes (Signed)
 Heart Failure Navigator Progress Note  Assessed for Heart & Vascular TOC clinic readiness.  Patient does not meet criteria due to per MD note patient with history of Dementia. No HF TOC. .   Navigator will sign off at this time.   Randie Bustle, BSN, Scientist, clinical (histocompatibility and immunogenetics) Only

## 2024-03-01 NOTE — Plan of Care (Signed)

## 2024-03-02 DIAGNOSIS — J189 Pneumonia, unspecified organism: Secondary | ICD-10-CM | POA: Diagnosis not present

## 2024-03-02 MED ORDER — LORAZEPAM 0.5 MG PO TABS: 0.5000 mg | ORAL_TABLET | Freq: Two times a day (BID) | ORAL | 0 refills | Status: AC | PRN

## 2024-03-02 MED ORDER — QUETIAPINE FUMARATE 25 MG PO TABS
25.0000 mg | ORAL_TABLET | Freq: Two times a day (BID) | ORAL | Status: DC
  Administered 2024-03-02: 25 mg via ORAL
  Filled 2024-03-02: qty 1

## 2024-03-02 MED ORDER — AZITHROMYCIN 500 MG PO TABS: 500.0000 mg | ORAL_TABLET | Freq: Every day | ORAL | Status: DC

## 2024-03-02 MED ORDER — CEPHALEXIN 500 MG PO CAPS: 500.0000 mg | ORAL_CAPSULE | Freq: Three times a day (TID) | ORAL | Status: AC

## 2024-03-02 MED ORDER — QUETIAPINE FUMARATE 25 MG PO TABS: 25.0000 mg | ORAL_TABLET | Freq: Every day | ORAL | Status: DC

## 2024-03-02 NOTE — TOC Progression Note (Signed)
 Transition of Care [facility]) - Progression Note    Patient Details  Name: [name] [name] MRN: [id number] Date of Birth: 1935/07/19  Transition of Care [facility]) CM/SW Contact  Arriana Lohmann A Aanvi Voyles, LCSW Phone Number: 03/02/2024, 12:26 PM  Clinical Narrative:     CSW informed by Trauma CSW to contact [facility] regarding discharge. CSW contacted [phone] as requested.  Unable to leave VM. Will contact again. Sent DC summary to facility.   CSW will continue to follow.   Expected Discharge Plan: Skilled Nursing Facility Barriers to Discharge: Continued Medical Work up               Expected Discharge Plan and Services In-house Referral: Clinical Social Work   Post Acute Care Choice: Skilled Nursing Facility Living arrangements for the past 2 months: Skilled Nursing Facility Expected Discharge Date: 03/02/24                                     Social Drivers of Health (SDOH) Interventions SDOH Screenings   Food Insecurity: No Food Insecurity (08/21/2023)  Housing: Low Risk  (08/21/2023)  Transportation Needs: No Transportation Needs (08/21/2023)  Utilities: Not At Risk (08/21/2023)  Financial Resource Strain: Low Risk  (02/21/2023)   Received from [facility]  Social Connections: Socially Isolated (08/21/2023)  Tobacco Use: Medium Risk (12/08/2023)    Readmission Risk Interventions    02/29/2024    2:53 PM 08/22/2023    2:18 PM  Readmission Risk Prevention Plan  Transportation Screening Complete Complete  Medication Review (RN Care Manager) Complete Complete  PCP or Specialist appointment within 3-5 days of discharge Complete Complete  HRI or Home Care Consult Complete Complete  SW Recovery Care/Counseling Consult Complete Complete  Palliative Care Screening Not Applicable Not Applicable  Skilled Nursing Facility Complete Not Complete  SNF Comments  Will complete tomorrow if she will meet 3-night inpatient stay.

## 2024-03-02 NOTE — Progress Notes (Signed)
 Attempted to call [facility] twice at [phone] & [phone], no answer.

## 2024-03-02 NOTE — Discharge Summary (Signed)
 Discharge summary note.  [name] [name] FMW:[id number] DOB: [birth year] DOA: 02/28/2024  PCP: [facility]  Admit date: 02/28/2024  Discharge date: 03/02/2024  Admitted From: SNF   Disposition:  SNF with Hospice as before   Recommendations for Outpatient Follow-up:   Follow up with PCP in 1-2 weeks  PCP Please obtain BMP/CBC, 2 view CXR in 1week,  (see Discharge instructions)   PCP Please follow up on the following pending results:     Home Health: None   Equipment/Devices: None  Consultations: None  Discharge Condition: Fair CODE STATUS: DNR  Diet Recommendation: Dysphagia 3 diet with fully feeding assistance and aspiration precautions    Chief Complaint  Patient presents with   Code Stroke     Brief history of present illness from the day of admission and additional interim summary    88 y.o. female with medical history significant for chronic combined diastolic and systolic CHF, moderate to severe pulmonary hypertension, essential hypertension, type 2 diabetes, peripheral artery disease, coronary artery disease, chronic hypoxia on 2 L nasal cannula continuously, CKD 3B, dementia, who presents to the ER from SNF initially as a code stroke.  Reportedly the patient was hypoxic and had left-sided deviation.   Upon arrival to the ER, the patient was evaluated by stroke team and was found to have no focal deficits.  Code stroke was canceled by neurologist.  Further workup revealed chest x-ray showing left lower lobe opacities concerning for pneumonia.  She was admitted for pneumonia treatment.                                                                 Hospital Course    Community-acquired pneumonia, POA  She is clinically much improved, placed on empiric antibiotics here, afebrile, no  leukocytosis, no shortness of breath, finished antibiotic course and 3 more days at SNF, feeding assistance and aspiration precautions.   Elevated troponin, suspect demand ischemia in the setting of pneumonia, underlying history of chronic combined systolic and diastolic CHF with EF around 20%.  High-sensitivity troponin peaked at 310 and downtrended, 25, no chest pain, trend is not consistent with ACS, on aspirin  and statin, monitor telemetry.  Outpatient cardiology follow-up if desired, patient now is being followed with hospice at SNF and goal of care is more directed towards comfort, this was discussed with patient's daughter as well.   Mild acute chronic combined diastolic and systolic CHF EF 20% - Asa, statin, farxiga  held as blood pressure dropping low, received one-time Lasix  on 02/29/2024, currently stable, baseline low blood pressures.  No crackles or shortness of breath currently.    Moderate to severe pulmonary hypertension - No acute issues.  Follow-up with cardiology outpatient.   Abnormal UA: concern for UTI, no dysuria and clinically resolved.   Coronary  artery disease Peripheral artery disease  Resumed home regimen.   CKD 3B stable continue to monitor   Chronic hypoxia, on 2 L nasal cannula continuously Wears 2 L nasal cannula oxygen at baseline, stable.   Generalized weakness  PT OT SNF as needed, apparently she is bedbound for the last few weeks and was being followed by hospice at the SNF facility.   Dementia Reorient as needed.  Risk for hospital-acquired delirium, minimize narcotics and benzodiazepines, exposed to sunlight. Bed bound.  Nighttime Seroquel  added to home regimen.  Monitor at SNF.   Code stroke, canceled Code stroke was called by EMS due to reported left-sided deviation No focal deficits on exam-no need to pursue TIA/stroke workup at this time, per neurology.   Discharge diagnosis     Principal Problem:   CAP (community acquired pneumonia) Active  Problems:   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)   Chronic HFrEF (heart failure with reduced ejection fraction) (HCC)   Pneumonia due to infectious organism   Pulmonary hypertension, moderate to severe [facility])    Discharge instructions    Discharge Instructions     Discharge instructions   Complete by: As directed    Follow with Primary MD at SNF in 7 days   Get CBC, CMP, Magnesium , 2 view Chest X ray -  checked next visit with your primary MD or SNF MD   Activity: As tolerated with Full fall precautions use walker/cane & assistance as needed  Disposition SNF  Diet: Dysphagia 3 diet with feeding assistance and aspiration precautions.  Special Instructions: If you have smoked or chewed Tobacco  in the last 2 yrs please stop smoking, stop any regular Alcohol  and or any Recreational drug use.  On your next visit with your primary care physician please Get Medicines reviewed and adjusted.  Please request your Prim.MD to go over all Hospital Tests and Procedure/Radiological results at the follow up, please get all Hospital records sent to your Prim MD by signing hospital release before you go home.  If you experience worsening of your admission symptoms, develop shortness of breath, life threatening emergency, suicidal or homicidal thoughts you must seek medical attention immediately by calling 911 or calling your MD immediately  if symptoms less severe.  You Must read complete instructions/literature along with all the possible adverse reactions/side effects for all the Medicines you take and that have been prescribed to you. Take any new Medicines after you have completely understood and accpet all the possible adverse reactions/side effects.   Do not drive when taking Pain medications.  Do not take more than prescribed Pain, Sleep and Anxiety Medications  Wear Seat belts while driving.       Discharge Medications   Allergies as of 03/02/2024       Reactions    Codeine Other (See Comments)   I go crazy   Etodolac    Other reaction(s): Unknown   Indomethacin    Other reaction(s): Unknown   Penicillins Other (See Comments)   Does not remember this allergy Has patient had a PCN reaction causing immediate rash, facial/tongue/throat swelling, SOB or lightheadedness with hypotension: Unknown Has patient had a PCN reaction causing severe rash involving mucus membranes or skin necrosis: Unknown Has patient had a PCN reaction that required hospitalization: Unknown Has patient had a PCN reaction occurring within the last 10 years: Unknown If all of the above answers are NO, then may proceed with Cephalosporin use.   Tizanidine  Other reaction(s): Syncope Fell on the floor    Tramadol    Other reaction(s): Unknown        Medication List     STOP taking these medications    dapagliflozin  propanediol 10 MG Tabs tablet Commonly known as: FARXIGA        TAKE these medications    acetaminophen  500 MG tablet Commonly known as: TYLENOL  Take 1,000 mg by mouth every 8 (eight) hours as needed for mild pain (pain score 1-3).   allopurinol  100 MG tablet Commonly known as: ZYLOPRIM  Take 1 tablet by mouth daily.   aspirin  81 MG chewable tablet Chew 81 mg by mouth daily.   atorvastatin  40 MG tablet Commonly known as: LIPITOR Take 1 tablet (40 mg total) by mouth daily. What changed: when to take this   azithromycin  500 MG tablet Commonly known as: ZITHROMAX  Take 1 tablet (500 mg total) by mouth daily.   busPIRone  7.5 MG tablet Commonly known as: BUSPAR  Take 7.5 mg by mouth 2 (two) times daily.   cephALEXin  500 MG capsule Commonly known as: KEFLEX  Take 1 capsule (500 mg total) by mouth 3 (three) times daily for 4 days.   colchicine  0.6 MG tablet Take 0.6 mg by mouth daily as needed.   LORazepam  0.5 MG tablet Commonly known as: ATIVAN  Take 1 tablet (0.5 mg total) by mouth every 12 (twelve) hours as needed for up to 5 days for  anxiety.   memantine  10 MG tablet Commonly known as: NAMENDA  Take 2 tablets by mouth 2 (two) times daily.   nystatin powder Commonly known as: MYCOSTATIN/NYSTOP Apply 1 Application topically 3 (three) times daily.   OXYGEN Inhale 2 L into the lungs continuous.   QUEtiapine  25 MG tablet Commonly known as: SEROQUEL  Take 1 tablet (25 mg total) by mouth at bedtime.   Rexulti  2 MG Tabs tablet Generic drug: brexpiprazole  Take 2 mg by mouth daily.   sodium chloride  0.65 % Soln nasal spray Commonly known as: OCEAN Place 1 spray into both nostrils as needed for congestion.         Contact information for follow-up providers     [facility]. Schedule an appointment as soon as possible for a visit in 1 week(s).   Specialty: Skilled Nursing Facility Contact information: [address] [place] [address] [phone]              Contact information for after-discharge care     Destination     [facility] .   Service: Skilled Nursing Contact information: [address] [place]  [phone]                     Major procedures and Radiology Reports - PLEASE review detailed and final reports thoroughly  -       ECHOCARDIOGRAM COMPLETE Result Date: 02/28/2024    ECHOCARDIOGRAM REPORT   Patient Name:   [name] [name] Date of Exam: 02/28/2024 Medical Rec #:  [id number]     Height:       65.0 in Accession #:    [id number]    Weight:       147.7 lb Date of Birth:  24-Oct-1935     BSA:          1.739 m Patient Age:    88 years      BP:           93/67 mmHg Patient Gender: F  HR:           85 bpm. Exam Location:  Inpatient Procedure: 2D Echo and Intracardiac Opacification Agent (Both Spectral and Color            Flow Doppler were utilized during procedure). Indications:    Elevated Troponin  History:        Patient has prior history of  Echocardiogram examinations. CHF;                 Signs/Symptoms:Elevated Troponin.  Sonographer:    Norleen Amour Referring Phys: SHONA LAURENCE, N IMPRESSIONS  1. Left ventricular ejection fraction, by estimation, is <20%. The left ventricle has severely decreased function. The left ventricle demonstrates global hypokinesis. There is mild concentric left ventricular hypertrophy. Left ventricular diastolic parameters are indeterminate.  2. Right ventricular systolic function is severely reduced. The right ventricular size is moderately enlarged. There is moderately elevated pulmonary artery systolic pressure. The estimated right ventricular systolic pressure is 48.2 mmHg.  3. Left atrial size was moderately dilated.  4. Right atrial size was moderately dilated.  5. The mitral valve is normal in structure. Moderate mitral valve regurgitation. No evidence of mitral stenosis.  6. Tricuspid valve regurgitation is severe.  7. The aortic valve is tricuspid. There is mild calcification of the aortic valve. Aortic valve regurgitation is not visualized. No aortic stenosis is present.  8. The inferior vena cava is dilated in size with <50% respiratory variability, suggesting right atrial pressure of 15 mmHg. FINDINGS  Left Ventricle: Left ventricular ejection fraction, by estimation, is <20%. The left ventricle has severely decreased function. The left ventricle demonstrates global hypokinesis. The left ventricular internal cavity size was normal in size. There is mild concentric left ventricular hypertrophy. Left ventricular diastolic parameters are indeterminate. Right Ventricle: The right ventricular size is moderately enlarged. No increase in right ventricular wall thickness. Right ventricular systolic function is severely reduced. There is moderately elevated pulmonary artery systolic pressure. The tricuspid regurgitant velocity is 2.88 m/s, and with an assumed right atrial pressure of 15 mmHg, the estimated right  ventricular systolic pressure is 48.2 mmHg. Left Atrium: Left atrial size was moderately dilated. Right Atrium: Right atrial size was moderately dilated. Pericardium: There is no evidence of pericardial effusion. Mitral Valve: The mitral valve is normal in structure. Moderate mitral valve regurgitation. No evidence of mitral valve stenosis. Tricuspid Valve: The tricuspid valve is normal in structure. Tricuspid valve regurgitation is severe. No evidence of tricuspid stenosis. Aortic Valve: The aortic valve is tricuspid. There is mild calcification of the aortic valve. Aortic valve regurgitation is not visualized. No aortic stenosis is present. Pulmonic Valve: The pulmonic valve was normal in structure. Pulmonic valve regurgitation is not visualized. No evidence of pulmonic stenosis. Aorta: The aortic root is normal in size and structure. Venous: The inferior vena cava is dilated in size with less than 50% respiratory variability, suggesting right atrial pressure of 15 mmHg. IAS/Shunts: No atrial level shunt detected by color flow Doppler.  LEFT VENTRICLE PLAX 2D LVIDd:         4.10 cm      Diastology LVIDs:         3.90 cm      LV e' medial:    2.70 cm/s LV PW:         1.10 cm      LV E/e' medial:  24.6 LV IVS:        1.30 cm      LV e' lateral:  3.49 cm/s LVOT diam:     1.50 cm      LV E/e' lateral: 19.1 LV SV:         11 LV SV Index:   6 LVOT Area:     1.77 cm  LV Volumes (MOD) LV vol d, MOD A2C: 100.0 ml LV vol d, MOD A4C: 85.7 ml LV vol s, MOD A2C: 94.6 ml LV vol s, MOD A4C: 44.7 ml LV SV MOD A2C:     5.4 ml LV SV MOD A4C:     85.7 ml LV SV MOD BP:      28.2 ml RIGHT VENTRICLE            IVC RV Basal diam:  4.40 cm    IVC diam: 1.90 cm RV S prime:     6.47 cm/s TAPSE (M-mode): 1.8 cm     PULMONARY VEINS                            Diastolic Velocity: 36.80 cm/s                            S/D Velocity:       0.50                            Systolic Velocity:  17.60 cm/s LEFT ATRIUM           Index        RIGHT  ATRIUM           Index LA diam:      3.50 cm 2.01 cm/m   RA Area:     16.10 cm LA Vol (A2C): 32.8 ml 18.86 ml/m  RA Volume:   45.20 ml  25.99 ml/m LA Vol (A4C): 64.6 ml 37.15 ml/m  AORTIC VALVE             PULMONIC VALVE LVOT Vmax:   44.20 cm/s  PV Vmax:       0.57 m/s LVOT Vmean:  30.500 cm/s PV Peak grad:  1.3 mmHg LVOT VTI:    0.062 m  AORTA Ao Root diam: 2.50 cm Ao Asc diam:  2.90 cm MITRAL VALVE               TRICUSPID VALVE MV Area (PHT): 3.36 cm    TR Peak grad:   33.2 mmHg MV Decel Time: 226 msec    TR Vmax:        288.00 cm/s MV E velocity: 66.50 cm/s MV A velocity: 16.20 cm/s  SHUNTS MV E/A ratio:  4.10        Systemic VTI:  0.06 m                            Systemic Diam: 1.50 cm Toribio Fuel MD Electronically signed by Toribio Fuel MD Signature Date/Time: 02/28/2024/8:37:00 PM    Final    DG Chest Port 1 View Result Date: 02/28/2024 EXAM: 1 VIEW(S) XRAY OF THE CHEST 02/28/2024 03:12:00 AM COMPARISON: None available. CLINICAL HISTORY: hypoxia hypoxia FINDINGS: LUNGS AND PLEURA: Left lower lung opacities. Chronic coarsened interstitial markings. No pleural effusion. No pneumothorax. HEART AND MEDIASTINUM: Similar enlarged cardiomediastinal silhouette.  Aortic atherosclerosis. BONES AND SOFT TISSUES: No acute osseous abnormality. IMPRESSION: 1. Left lower lung opacities, which could represent atelectasis or pneumonia.  Electronically signed by: Gilmore Molt MD 02/28/2024 03:56 AM EST RP Workstation: [id number]    Micro Results     No results found for this or any previous visit (from the past 240 hours).  Today   Subjective    Demira Milazzo today in bed confused but denies any headache chest or abdominal pain   Objective   Blood pressure 95/61, pulse 77, temperature 97.6 F (36.4 C), temperature source Oral, resp. rate (!) 23, height 5' 5 (1.651 m), weight 65.2 kg, SpO2 96%.   Intake/Output Summary (Last 24 hours) at 03/02/2024 0945 Last data filed at 03/02/2024  0915 Gross per 24 hour  Intake 472 ml  Output 200 ml  Net 272 ml    Exam  Awake and remains pleasantly confused no new F.N deficits,    [place].AT,PERRAL Supple Neck,   Symmetrical Chest wall movement, Good air movement bilaterally, CTAB RRR,No Gallops,   +ve B.Sounds, Abd Soft, Non tender,  No Cyanosis, Clubbing or edema    Data Review   Recent Labs  Lab 02/28/24 0158 02/28/24 0215 02/28/24 0219 02/29/24 0126 03/01/24 0313  WBC 4.6  --   --  6.2 4.7  HGB 14.7 18.0* 16.0* 14.8 14.8  HCT 44.6 53.0* 47.0* 45.8 45.1  PLT 185  --   --  211 204  MCV 88.8  --   --  90.0 89.1  MCH 29.3  --   --  29.1 29.2  MCHC 33.0  --   --  32.3 32.8  RDW 21.3*  --   --  21.3* 21.9*  LYMPHSABS 1.0  --   --  1.3 1.1  MONOABS 0.2  --   --  0.5 0.3  EOSABS 0.2  --   --  0.1 0.1  BASOSABS 0.0  --   --  0.0 0.0    Recent Labs  Lab 02/28/24 0200 02/28/24 0215 02/28/24 0217 02/28/24 0219 02/28/24 0307 02/28/24 0425 02/29/24 0126 03/01/24 0313  NA  --  143  --  144  --  144 139 143  K  --  4.2  --  3.5  --  3.7 3.8 3.8  CL  --  109  --   --   --  110 105 108  CO2  --   --   --   --   --  20* 19* 22  ANIONGAP  --   --   --   --   --  14 15 13   GLUCOSE  --  130*  --   --   --  124* 166* 131*  BUN  --  23  --   --   --  17 16 15   CREATININE  --  0.90  --   --   --  1.00 0.96 0.97  AST  --   --   --   --   --  37  --   --   ALT  --   --   --   --   --  24  --   --   ALKPHOS  --   --   --   --   --  116  --   --   BILITOT  --   --   --   --   --  1.7*  --   --   ALBUMIN  --   --   --   --   --  2.6*  --   --  CRP  --   --   --   --  1.3*  --   --  1.5*  PROCALCITON  --   --   --   --   --   --  <0.10 <0.10  INR  --   --   --   --   --  1.3*  --   --   BNP 2,195.0*  --   --   --   --   --   --  1,627.8*  MG  --   --  1.8  --   --   --  1.9 1.7  CALCIUM   --   --   --   --   --  8.8* 8.4* 8.5*    Total Time in preparing paper work, data evaluation and todays exam - 35  minutes  Signature  -    Lavada Stank M.D on 03/02/2024 at 9:45 AM   -  To page go to [email or web address]

## 2024-03-02 NOTE — Discharge Instructions (Signed)
 Follow with Primary MD at SNF in 7 days   Get CBC, CMP, Magnesium , 2 view Chest X ray -  checked next visit with your primary MD or SNF MD   Activity: As tolerated with Full fall precautions use walker/cane & assistance as needed  Disposition SNF  Diet: Dysphagia 3 diet with feeding assistance and aspiration precautions.  Special Instructions: If you have smoked or chewed Tobacco  in the last 2 yrs please stop smoking, stop any regular Alcohol  and or any Recreational drug use.  On your next visit with your primary care physician please Get Medicines reviewed and adjusted.  Please request your Prim.MD to go over all Hospital Tests and Procedure/Radiological results at the follow up, please get all Hospital records sent to your Prim MD by signing hospital release before you go home.  If you experience worsening of your admission symptoms, develop shortness of breath, life threatening emergency, suicidal or homicidal thoughts you must seek medical attention immediately by calling 911 or calling your MD immediately  if symptoms less severe.  You Must read complete instructions/literature along with all the possible adverse reactions/side effects for all the Medicines you take and that have been prescribed to you. Take any new Medicines after you have completely understood and accpet all the possible adverse reactions/side effects.   Do not drive when taking Pain medications.  Do not take more than prescribed Pain, Sleep and Anxiety Medications  Wear Seat belts while driving.

## 2024-03-02 NOTE — TOC Transition Note (Signed)
 Transition of Care [facility]) - Discharge Note   Patient Details  Name: [name] [name] MRN: [id number] Date of Birth: 16-Oct-1935  [facility] CM/SW Contact:  Jalien Weakland A Krisna Omar, LCSW Phone Number: 03/02/2024, 1:42 PM   Clinical Narrative:     Patient will DC to: [facility]  Anticipated DC date: 03/02/24  Family notified: Sharman Holt, pt's daughter  Transport by: [facility]      Per MD patient ready for DC to [facility]. RN, patient, patient's family, and facility notified of DC. Discharge Summary and FL2 sent to facility. RN to call report prior to discharge [phone]). DC packet on chart. Ambulance transport requested for patient.     CSW will sign off for now as social work intervention is no longer needed. Please consult us  again if new needs arise.    Final next level of care: Skilled Nursing Facility Barriers to Discharge: Barriers Resolved   Patient Goals and CMS Choice Patient states their goals for this hospitalization and ongoing recovery are:: Reutn to LTC at [facility] CMS Medicare.gov Compare Post Acute Care list provided to:: Patient        Discharge Placement              Patient chooses bed at: [facility] Patient to be transferred to facility by: [facility] Name of family member notified: Sharman Holt Patient and family notified of of transfer: 03/02/24  Discharge Plan and Services Additional resources added to the After Visit Summary for   In-house Referral: Clinical Social Work   Post Acute Care Choice: Skilled Nursing Facility                               Social Drivers of Health (SDOH) Interventions SDOH Screenings   Food Insecurity: No Food Insecurity (08/21/2023)  Housing: Low Risk  (08/21/2023)  Transportation Needs: No Transportation Needs (08/21/2023)  Utilities: Not At Risk (08/21/2023)  Financial Resource Strain: Low Risk  (02/21/2023)   Received from [facility]  System  Social Connections: Socially Isolated (08/21/2023)  Tobacco Use: Medium Risk (12/08/2023)     Readmission Risk Interventions    02/29/2024    2:53 PM 08/22/2023    2:18 PM  Readmission Risk Prevention Plan  Transportation Screening Complete Complete  Medication Review (RN Care Manager) Complete Complete  PCP or Specialist appointment within 3-5 days of discharge Complete Complete  HRI or Home Care Consult Complete Complete  SW Recovery Care/Counseling Consult Complete Complete  Palliative Care Screening Not Applicable Not Applicable  Skilled Nursing Facility Complete Not Complete  SNF Comments  Will complete tomorrow if she will meet 3-night inpatient stay.

## 2024-04-11 DEATH — deceased
